# Patient Record
Sex: Female | Born: 1990 | Race: Black or African American | Hispanic: No | Marital: Single | Smoking: Current some day smoker
Health system: Southern US, Community
[De-identification: ages and names within clinical notes are randomized; demographics above are authoritative.]

## PROBLEM LIST (undated history)

## (undated) ENCOUNTER — Inpatient Hospital Stay (HOSPITAL_COMMUNITY): Payer: Self-pay

## (undated) DIAGNOSIS — F329 Major depressive disorder, single episode, unspecified: Secondary | ICD-10-CM

## (undated) DIAGNOSIS — O139 Gestational [pregnancy-induced] hypertension without significant proteinuria, unspecified trimester: Secondary | ICD-10-CM

---
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
## 2009-07-03 ENCOUNTER — Ambulatory Visit (HOSPITAL_COMMUNITY): Admission: RE | Admit: 2009-07-03 | Discharge: 2009-07-03 | Payer: Self-pay | Admitting: Obstetrics

## 2009-09-08 DIAGNOSIS — O139 Gestational [pregnancy-induced] hypertension without significant proteinuria, unspecified trimester: Secondary | ICD-10-CM

## 2009-09-08 HISTORY — DX: Gestational (pregnancy-induced) hypertension without significant proteinuria, unspecified trimester: O13.9

## 2009-11-26 ENCOUNTER — Inpatient Hospital Stay (HOSPITAL_COMMUNITY): Admission: AD | Admit: 2009-11-26 | Discharge: 2009-11-29 | Payer: Self-pay | Admitting: Obstetrics

## 2010-01-16 ENCOUNTER — Encounter: Admission: RE | Admit: 2010-01-16 | Discharge: 2010-01-16 | Payer: Self-pay | Admitting: Obstetrics

## 2010-12-02 LAB — COMPREHENSIVE METABOLIC PANEL
ALT: 17 U/L (ref 0–35)
Albumin: 2.2 g/dL — ABNORMAL LOW (ref 3.5–5.2)
Alkaline Phosphatase: 125 U/L — ABNORMAL HIGH (ref 39–117)
BUN: 6 mg/dL (ref 6–23)
CO2: 20 mEq/L (ref 19–32)
CO2: 25 mEq/L (ref 19–32)
Calcium: 7.4 mg/dL — ABNORMAL LOW (ref 8.4–10.5)
Calcium: 8.9 mg/dL (ref 8.4–10.5)
Chloride: 106 mEq/L (ref 96–112)
GFR calc Af Amer: >60 mL/min (ref >60)
GFR calc non Af Amer: >60 mL/min (ref >60)
GFR calc non Af Amer: >60 mL/min (ref >60)
Glucose, Bld: 104 mg/dL — ABNORMAL HIGH (ref 70–99)
Sodium: 136 mEq/L (ref 135–145)
Sodium: 136 mEq/L (ref 135–145)
Total Bilirubin: 0.1 mg/dL — ABNORMAL LOW (ref 0.3–1.2)
Total Bilirubin: 0.1 mg/dL — ABNORMAL LOW (ref 0.3–1.2)
Total Protein: 5.5 g/dL — ABNORMAL LOW (ref 6.0–8.3)
Total Protein: 5.9 g/dL — ABNORMAL LOW (ref 6.0–8.3)

## 2010-12-02 LAB — CBC
HCT: 29.6 % — ABNORMAL LOW (ref 36.0–46.0)
Hemoglobin: 9.6 g/dL — ABNORMAL LOW (ref 12.0–15.0)
Hemoglobin: 9.8 g/dL — ABNORMAL LOW (ref 12.0–15.0)
Platelets: 245 10*3/uL (ref 150–400)
RBC: 3.14 MIL/uL — ABNORMAL LOW (ref 3.87–5.11)
RBC: 3.49 MIL/uL — ABNORMAL LOW (ref 3.87–5.11)
RBC: 3.52 MIL/uL — ABNORMAL LOW (ref 3.87–5.11)
RDW: 15.3 % (ref 11.5–15.5)
WBC: 7.7 10*3/uL (ref 4.0–10.5)
WBC: 9 10*3/uL (ref 4.0–10.5)

## 2010-12-02 LAB — TYPE AND SCREEN: ABO/RH(D): A POS

## 2010-12-02 LAB — LACTATE DEHYDROGENASE: LDH: 220 U/L (ref 94–250)

## 2011-02-28 ENCOUNTER — Inpatient Hospital Stay (INDEPENDENT_AMBULATORY_CARE_PROVIDER_SITE_OTHER)
Admission: RE | Admit: 2011-02-28 | Discharge: 2011-02-28 | Disposition: A | Discharge status: Home / Self Care | Payer: Medicaid Other | Source: Ambulatory Visit | Attending: Emergency Medicine | Admitting: Emergency Medicine

## 2011-02-28 DIAGNOSIS — Z331 Pregnant state, incidental: Secondary | ICD-10-CM

## 2011-02-28 LAB — POCT URINALYSIS DIP (DEVICE)
Bilirubin Urine: NEGATIVE
Glucose, UA: NEGATIVE mg/dL
Hgb urine dipstick: NEGATIVE
Ketones, ur: NEGATIVE mg/dL
Leukocytes, UA: NEGATIVE
Nitrite: POSITIVE — AB
Protein, ur: NEGATIVE mg/dL
Specific Gravity, Urine: 1.02 (ref 1.005–1.030)
Urobilinogen, UA: 1 mg/dL (ref 0.0–1.0)

## 2011-02-28 LAB — POCT PREGNANCY, URINE: Preg Test, Ur: POSITIVE

## 2011-02-28 LAB — WET PREP, GENITAL

## 2011-05-14 ENCOUNTER — Other Ambulatory Visit (HOSPITAL_COMMUNITY): Payer: Self-pay | Admitting: Obstetrics

## 2011-05-14 DIAGNOSIS — Z3689 Encounter for other specified antenatal screening: Secondary | ICD-10-CM

## 2011-05-16 ENCOUNTER — Ambulatory Visit (HOSPITAL_COMMUNITY)
Admission: RE | Admit: 2011-05-16 | Discharge: 2011-05-16 | Disposition: A | Discharge status: Home / Self Care | Payer: Medicaid Other | Source: Ambulatory Visit | Attending: Obstetrics | Admitting: Obstetrics

## 2011-05-16 DIAGNOSIS — Z3689 Encounter for other specified antenatal screening: Secondary | ICD-10-CM

## 2011-05-16 DIAGNOSIS — Z1389 Encounter for screening for other disorder: Secondary | ICD-10-CM | POA: Insufficient documentation

## 2011-05-16 DIAGNOSIS — Z363 Encounter for antenatal screening for malformations: Secondary | ICD-10-CM | POA: Insufficient documentation

## 2011-05-16 DIAGNOSIS — O358XX0 Maternal care for other (suspected) fetal abnormality and damage, not applicable or unspecified: Secondary | ICD-10-CM | POA: Insufficient documentation

## 2011-05-22 ENCOUNTER — Other Ambulatory Visit (HOSPITAL_COMMUNITY): Payer: Self-pay | Admitting: Obstetrics

## 2011-05-22 DIAGNOSIS — N1330 Unspecified hydronephrosis: Secondary | ICD-10-CM

## 2011-07-04 ENCOUNTER — Ambulatory Visit (HOSPITAL_COMMUNITY)
Admission: RE | Admit: 2011-07-04 | Discharge: 2011-07-04 | Disposition: A | Discharge status: Home / Self Care | Payer: Medicaid Other | Source: Ambulatory Visit | Attending: Obstetrics | Admitting: Obstetrics

## 2011-07-04 DIAGNOSIS — N1330 Unspecified hydronephrosis: Secondary | ICD-10-CM

## 2011-07-04 DIAGNOSIS — O09299 Supervision of pregnancy with other poor reproductive or obstetric history, unspecified trimester: Secondary | ICD-10-CM | POA: Insufficient documentation

## 2011-07-04 DIAGNOSIS — O358XX0 Maternal care for other (suspected) fetal abnormality and damage, not applicable or unspecified: Secondary | ICD-10-CM | POA: Insufficient documentation

## 2011-09-22 ENCOUNTER — Other Ambulatory Visit (HOSPITAL_COMMUNITY): Payer: Self-pay | Admitting: Obstetrics

## 2011-09-22 DIAGNOSIS — N1330 Unspecified hydronephrosis: Secondary | ICD-10-CM

## 2011-09-25 ENCOUNTER — Ambulatory Visit (HOSPITAL_COMMUNITY)
Admission: RE | Admit: 2011-09-25 | Discharge: 2011-09-25 | Disposition: A | Discharge status: Home / Self Care | Payer: Medicaid Other | Source: Ambulatory Visit | Attending: Obstetrics | Admitting: Obstetrics

## 2011-09-25 DIAGNOSIS — O358XX0 Maternal care for other (suspected) fetal abnormality and damage, not applicable or unspecified: Secondary | ICD-10-CM | POA: Insufficient documentation

## 2011-09-25 DIAGNOSIS — N1330 Unspecified hydronephrosis: Secondary | ICD-10-CM

## 2011-10-11 ENCOUNTER — Encounter (HOSPITAL_COMMUNITY): Payer: Self-pay

## 2011-10-11 ENCOUNTER — Inpatient Hospital Stay (HOSPITAL_COMMUNITY)
Admission: AD | Admit: 2011-10-11 | Discharge: 2011-10-11 | Disposition: A | Discharge status: Home / Self Care | Payer: Medicaid Other | Source: Ambulatory Visit | Attending: Obstetrics | Admitting: Obstetrics

## 2011-10-11 DIAGNOSIS — O99891 Other specified diseases and conditions complicating pregnancy: Secondary | ICD-10-CM | POA: Insufficient documentation

## 2011-10-11 DIAGNOSIS — O479 False labor, unspecified: Secondary | ICD-10-CM

## 2011-10-11 LAB — URINALYSIS, DIPSTICK ONLY
Bilirubin Urine: NEGATIVE
Hgb urine dipstick: NEGATIVE
Ketones, ur: NEGATIVE mg/dL
Nitrite: NEGATIVE
Protein, ur: NEGATIVE mg/dL
Specific Gravity, Urine: 1.01 (ref 1.005–1.030)
Urobilinogen, UA: 0.2 mg/dL (ref 0.0–1.0)

## 2011-10-11 LAB — URIC ACID: Uric Acid, Serum: 4.3 mg/dL (ref 2.4–7.0)

## 2011-10-11 LAB — COMPREHENSIVE METABOLIC PANEL
ALT: 8 U/L (ref 0–35)
AST: 14 U/L (ref 0–37)
Calcium: 8.5 mg/dL (ref 8.4–10.5)
Creatinine, Ser: 0.43 mg/dL — ABNORMAL LOW (ref 0.50–1.10)
GFR calc non Af Amer: >90 mL/min (ref >90)
Sodium: 134 mEq/L — ABNORMAL LOW (ref 135–145)
Total Protein: 6.2 g/dL (ref 6.0–8.3)

## 2011-10-11 LAB — CBC
MCH: 25.5 pg — ABNORMAL LOW (ref 26.0–34.0)
MCHC: 32.4 g/dL (ref 30.0–36.0)
MCV: 78.8 fL (ref 78.0–100.0)
Platelets: 269 10*3/uL (ref 150–400)
RDW: 14.2 % (ref 11.5–15.5)

## 2011-10-11 NOTE — Progress Notes (Signed)
Has been contracting on & off all week, but frequency increased to about q72mins yesterday @ 1400. Rates UCs as 7-8/10 on pain scale.   C/O small amt of grayish fluid present starting this AM @ 0600.

## 2011-10-11 NOTE — ED Provider Notes (Signed)
Chief Complaint:   ?SROM  Nicole [name] is  21 y.o.  G2P1001 at 39 6/7 wks presents complaining of rupture of membranes.  She states irregular UCs every 7 minutes  associated with no vaginal bleeding.  ? leaking membranes with wetness noted at 066, after that feeling "like peeing on myself" but no large gush;  active fetal movement. Cx 1.5 in office last week.    Obstetrical/Gynecological History: Menstrual History: OB History    No data available       No LMP recorded.     Past Medical History: No past medical history on file.  Past Surgical History: No past surgical history on file.  Family History: No family history on file.  Social History: History  Substance Use Topics  . Smoking status: Not on file  . Smokeless tobacco: Not on file  . Alcohol Use: Not on file    Allergies: Allergies not on file  Meds:  No prescriptions prior to admission    Review of Systems: Negative except Mild intermittent H/A past week, not at present  Physical Exam   Filed Vitals:   10/11/11 1225  BP: 141/82  Pulse: 93  Temp: 98.1 F (36.7 C)  Resp: 18   GENERAL: Well-developed, well-nourished female in no acute distress.  ABDOMEN: Soft, nontender, nondistended, gravid.  EXTREMITIES: Nontender, no edema, 2+ distal pulses. SPEC: physiologic white discharge, fern neg. Amnisure obtained Cervical Exam: Dilatation 1.5cm   Effacement 30%   Station -3        Presentation: cephalic FHT:  Baseline rate 150 bpm   Variability moderate  Accelerations present   Decelerations none Contractions: irreg Labs: Fern neg    Results for orders placed during the hospital encounter of 10/11/11 (from the past 24 hour(s))  AMNISURE RUPTURE OF MEMBRANE (ROM)     Status: Normal   Collection Time   10/11/11 12:13 PM      Component Value Range   Amnisure ROM NEGATIVE      Assessment/Plan:  G2P1001 at [redacted]w[redacted]d without evidence for ROM, not in labor. Cat 1 FHR Borderline BP elevation -> will  check serial BPs and UA dipstick, then C/W Dr. Gaynell Face.  Results for orders placed during the hospital encounter of 10/11/11 (from the past 24 hour(s))  AMNISURE RUPTURE OF MEMBRANE (ROM)     Status: Normal   Collection Time   10/11/11 12:13 PM      Component Value Range   Amnisure ROM NEGATIVE    URINALYSIS, DIPSTICK ONLY     Status: Normal   Collection Time   10/11/11  1:00 PM      Component Value Range   Specific Gravity, Urine 1.010  1.005 - 1.030    pH 7.5  5.0 - 8.0    Glucose, UA NEGATIVE  NEGATIVE (mg/dL)   Hgb urine dipstick NEGATIVE  NEGATIVE    Bilirubin Urine NEGATIVE  NEGATIVE    Ketones, ur NEGATIVE  NEGATIVE (mg/dL)   Protein, ur NEGATIVE  NEGATIVE (mg/dL)   Urobilinogen, UA 0.2  0.0 - 1.0 (mg/dL)   Nitrite NEGATIVE  NEGATIVE    Leukocytes, UA NEGATIVE  NEGATIVE   CBC     Status: Abnormal   Collection Time   10/11/11  2:03 PM      Component Value Range   WBC 6.5  4.0 - 10.5 (K/uL)   RBC 3.64 (*) 3.87 - 5.11 (MIL/uL)   Hemoglobin 9.3 (*) 12.0 - 15.0 (g/dL)   HCT 04.5 (*) 40.9 - 46.0 (%)  MCV 78.8  78.0 - 100.0 (fL)   MCH 25.5 (*) 26.0 - 34.0 (pg)   MCHC 32.4  30.0 - 36.0 (g/dL)   RDW 78.2  95.6 - 21.3 (%)   Platelets 269  150 - 400 (K/uL)  COMPREHENSIVE METABOLIC PANEL     Status: Abnormal   Collection Time   10/11/11  2:03 PM      Component Value Range   Sodium 134 (*) 135 - 145 (mEq/L)   Potassium 3.5  3.5 - 5.1 (mEq/L)   Chloride 103  96 - 112 (mEq/L)   CO2 22  19 - 32 (mEq/L)   Glucose, Bld 85  70 - 99 (mg/dL)   BUN 7  6 - 23 (mg/dL)   Creatinine, Ser 0.86 (*) 0.50 - 1.10 (mg/dL)   Calcium 8.5  8.4 - 57.8 (mg/dL)   Total Protein 6.2  6.0 - 8.3 (g/dL)   Albumin 2.6 (*) 3.5 - 5.2 (g/dL)   AST 14  0 - 37 (U/L)   ALT 8  0 - 35 (U/L)   Alkaline Phosphatase 146 (*) 39 - 117 (U/L)   Total Bilirubin 0.2 (*) 0.3 - 1.2 (mg/dL)   GFR calc non Af Amer >90  >90 (mL/min)   GFR calc Af Amer >90  >90 (mL/min)  LACTATE DEHYDROGENASE     Status: Normal   Collection  Time   10/11/11  2:03 PM      Component Value Range   LD 162  94 - 250 (U/L)  URIC ACID     Status: Normal   Collection Time   10/11/11  2:03 PM      Component Value Range   Uric Acid, Serum 4.3  2.4 - 7.0 (mg/dL)   RN spoke with Dr. Gaynell Face - Plan discharge home  Nicole [name] 2/2/201312:14 PM

## 2011-10-11 NOTE — Progress Notes (Signed)
Called D. Poe, CNM.  Reviewed Labs, EFM & BPs.  She will enter D/C orders.

## 2011-10-14 ENCOUNTER — Inpatient Hospital Stay (HOSPITAL_COMMUNITY): Payer: Medicaid Other

## 2011-10-14 ENCOUNTER — Inpatient Hospital Stay (HOSPITAL_COMMUNITY): Payer: Medicaid Other | Admitting: Anesthesiology

## 2011-10-14 ENCOUNTER — Encounter (HOSPITAL_COMMUNITY): Payer: Self-pay

## 2011-10-14 ENCOUNTER — Encounter (HOSPITAL_COMMUNITY): Payer: Self-pay | Admitting: Anesthesiology

## 2011-10-14 ENCOUNTER — Inpatient Hospital Stay (HOSPITAL_COMMUNITY)
Admission: RE | Admit: 2011-10-14 | Discharge: 2011-10-18 | DRG: 766 | Disposition: A | Discharge status: Home / Self Care | Payer: Medicaid Other | Source: Ambulatory Visit | Attending: Obstetrics | Admitting: Obstetrics

## 2011-10-14 ENCOUNTER — Encounter (HOSPITAL_COMMUNITY)
Admission: RE | Disposition: A | Discharge status: Home / Self Care | Payer: Self-pay | Source: Ambulatory Visit | Attending: Obstetrics

## 2011-10-14 DIAGNOSIS — O3660X0 Maternal care for excessive fetal growth, unspecified trimester, not applicable or unspecified: Principal | ICD-10-CM | POA: Diagnosis present

## 2011-10-14 LAB — CBC
HCT: 29.8 % — ABNORMAL LOW (ref 36.0–46.0)
MCV: 79.3 fL (ref 78.0–100.0)
RBC: 3.76 MIL/uL — ABNORMAL LOW (ref 3.87–5.11)
WBC: 6.7 10*3/uL (ref 4.0–10.5)

## 2011-10-14 LAB — COMPREHENSIVE METABOLIC PANEL
ALT: 9 U/L (ref 0–35)
Albumin: 2.7 g/dL — ABNORMAL LOW (ref 3.5–5.2)
Alkaline Phosphatase: 162 U/L — ABNORMAL HIGH (ref 39–117)
BUN: 8 mg/dL (ref 6–23)
Chloride: 104 mEq/L (ref 96–112)
GFR calc Af Amer: >90 mL/min (ref >90)
Glucose, Bld: 67 mg/dL — ABNORMAL LOW (ref 70–99)
Potassium: 3.6 mEq/L (ref 3.5–5.1)
Sodium: 135 mEq/L (ref 135–145)
Total Bilirubin: 0.2 mg/dL — ABNORMAL LOW (ref 0.3–1.2)
Total Protein: 6.5 g/dL (ref 6.0–8.3)

## 2011-10-14 LAB — RPR: RPR: NONREACTIVE

## 2011-10-14 LAB — LACTATE DEHYDROGENASE: LDH: 201 U/L (ref 94–250)

## 2011-10-14 LAB — HIV ANTIBODY (ROUTINE TESTING W REFLEX): HIV: NONREACTIVE

## 2011-10-14 LAB — GC/CHLAMYDIA PROBE AMP, GENITAL: Gonorrhea: NEGATIVE

## 2011-10-14 LAB — ABO/RH: RH Type: POSITIVE

## 2011-10-14 LAB — STREP B DNA PROBE: GBS: NEGATIVE

## 2011-10-14 SURGERY — Surgical Case
Anesthesia: Epidural | Site: Abdomen | Wound class: Clean Contaminated

## 2011-10-14 MED ORDER — PHENYLEPHRINE 40 MCG/ML (10ML) SYRINGE FOR IV PUSH (FOR BLOOD PRESSURE SUPPORT): 80.0000 ug | PREFILLED_SYRINGE | INTRAVENOUS | Status: DC | PRN

## 2011-10-14 MED ORDER — OXYTOCIN 10 UNIT/ML IJ SOLN
INTRAMUSCULAR | Status: AC
  Filled 2011-10-14: qty 2

## 2011-10-14 MED ORDER — FENTANYL 2.5 MCG/ML BUPIVACAINE 1/10 % EPIDURAL INFUSION (WH - ANES): 14.0000 mL/h | INTRAMUSCULAR | Status: DC

## 2011-10-14 MED ORDER — PHENYLEPHRINE 40 MCG/ML (10ML) SYRINGE FOR IV PUSH (FOR BLOOD PRESSURE SUPPORT)
80.0000 ug | PREFILLED_SYRINGE | INTRAVENOUS | Status: DC | PRN
  Filled 2011-10-14: qty 5

## 2011-10-14 MED ORDER — EPHEDRINE 5 MG/ML INJ: 10.0000 mg | INTRAVENOUS | Status: DC | PRN

## 2011-10-14 MED ORDER — MORPHINE SULFATE 0.5 MG/ML IJ SOLN
INTRAMUSCULAR | Status: AC
  Filled 2011-10-14: qty 10

## 2011-10-14 MED ORDER — CEFAZOLIN SODIUM 1-5 GM-% IV SOLN: INTRAVENOUS | Status: DC | PRN

## 2011-10-14 MED ORDER — ONDANSETRON HCL 4 MG/2ML IJ SOLN: 4.0000 mg | Freq: Four times a day (QID) | INTRAMUSCULAR | Status: DC | PRN

## 2011-10-14 MED ORDER — OXYCODONE-ACETAMINOPHEN 5-325 MG PO TABS: 1.0000 | ORAL_TABLET | ORAL | Status: DC | PRN

## 2011-10-14 MED ORDER — TERBUTALINE SULFATE 1 MG/ML IJ SOLN: 0.2500 mg | Freq: Once | INTRAMUSCULAR | Status: DC | PRN

## 2011-10-14 MED ORDER — LACTATED RINGERS IV SOLN: 500.0000 mL | INTRAVENOUS | Status: DC | PRN

## 2011-10-14 MED ORDER — OXYTOCIN 20 UNITS IN LACTATED RINGERS INFUSION - SIMPLE
1.0000 m[IU]/min | INTRAVENOUS | Status: DC
  Administered 2011-10-14: 1 m[IU]/min via INTRAVENOUS
  Filled 2011-10-14: qty 1000

## 2011-10-14 MED ORDER — SODIUM CHLORIDE 0.9 % IJ SOLN: 3.0000 mL | INTRAMUSCULAR | Status: DC | PRN

## 2011-10-14 MED ORDER — CEFAZOLIN SODIUM 1-5 GM-% IV SOLN
INTRAVENOUS | Status: AC
  Filled 2011-10-14: qty 50

## 2011-10-14 MED ORDER — FENTANYL 2.5 MCG/ML BUPIVACAINE 1/10 % EPIDURAL INFUSION (WH - ANES)
14.0000 mL/h | INTRAMUSCULAR | Status: DC
  Filled 2011-10-14: qty 60

## 2011-10-14 MED ORDER — FENTANYL 2.5 MCG/ML BUPIVACAINE 1/10 % EPIDURAL INFUSION (WH - ANES)
INTRAMUSCULAR | Status: DC | PRN
  Administered 2011-10-14: 14 mL/h via EPIDURAL

## 2011-10-14 MED ORDER — FLEET ENEMA 7-19 GM/118ML RE ENEM: 1.0000 | ENEMA | RECTAL | Status: DC | PRN

## 2011-10-14 MED ORDER — DIPHENHYDRAMINE HCL 50 MG/ML IJ SOLN: 12.5000 mg | INTRAMUSCULAR | Status: DC | PRN

## 2011-10-14 MED ORDER — EPHEDRINE 5 MG/ML INJ
10.0000 mg | INTRAVENOUS | Status: DC | PRN
  Filled 2011-10-14: qty 4

## 2011-10-14 MED ORDER — ONDANSETRON HCL 4 MG/2ML IJ SOLN
INTRAMUSCULAR | Status: AC
  Filled 2011-10-14: qty 2

## 2011-10-14 MED ORDER — LACTATED RINGERS IV SOLN
500.0000 mL | Freq: Once | INTRAVENOUS | Status: AC
  Administered 2011-10-14: 500 mL via INTRAVENOUS

## 2011-10-14 MED ORDER — LIDOCAINE-EPINEPHRINE (PF) 2 %-1:200000 IJ SOLN
INTRAMUSCULAR | Status: AC
  Filled 2011-10-14: qty 20

## 2011-10-14 MED ORDER — LACTATED RINGERS IV SOLN
INTRAVENOUS | Status: DC
  Administered 2011-10-14 – 2011-10-15 (×4): via INTRAVENOUS

## 2011-10-14 MED ORDER — EPHEDRINE 5 MG/ML INJ
INTRAVENOUS | Status: AC
  Filled 2011-10-14: qty 10

## 2011-10-14 MED ORDER — BUTORPHANOL TARTRATE 2 MG/ML IJ SOLN
1.0000 mg | INTRAMUSCULAR | Status: DC | PRN
  Administered 2011-10-14: 1 mg via INTRAVENOUS
  Filled 2011-10-14: qty 1

## 2011-10-14 MED ORDER — CITRIC ACID-SODIUM CITRATE 334-500 MG/5ML PO SOLN
30.0000 mL | ORAL | Status: DC | PRN
  Administered 2011-10-14: 30 mL via ORAL
  Filled 2011-10-14: qty 15

## 2011-10-14 MED ORDER — EPHEDRINE SULFATE 50 MG/ML IJ SOLN
INTRAMUSCULAR | Status: DC | PRN
  Administered 2011-10-14 – 2011-10-15 (×2): 10 mg via INTRAVENOUS

## 2011-10-14 MED ORDER — LIDOCAINE HCL (PF) 1 % IJ SOLN: 30.0000 mL | INTRAMUSCULAR | Status: DC | PRN

## 2011-10-14 MED ORDER — CEFAZOLIN SODIUM 1-5 GM-% IV SOLN
1.0000 g | Freq: Three times a day (TID) | INTRAVENOUS | Status: DC
  Administered 2011-10-14: 1 g via INTRAVENOUS
  Filled 2011-10-14 (×3): qty 50

## 2011-10-14 MED ORDER — IBUPROFEN 600 MG PO TABS: 600.0000 mg | ORAL_TABLET | Freq: Four times a day (QID) | ORAL | Status: DC | PRN

## 2011-10-14 MED ORDER — ACETAMINOPHEN 325 MG PO TABS: 650.0000 mg | ORAL_TABLET | ORAL | Status: DC | PRN

## 2011-10-14 MED ORDER — PHENYLEPHRINE 40 MCG/ML (10ML) SYRINGE FOR IV PUSH (FOR BLOOD PRESSURE SUPPORT)
PREFILLED_SYRINGE | INTRAVENOUS | Status: AC
  Filled 2011-10-14: qty 5

## 2011-10-14 MED ORDER — LACTATED RINGERS IV SOLN: 500.0000 mL | Freq: Once | INTRAVENOUS | Status: DC

## 2011-10-14 MED ORDER — OXYTOCIN BOLUS FROM INFUSION
500.0000 mL | Freq: Once | INTRAVENOUS | Status: DC
  Filled 2011-10-14: qty 500

## 2011-10-14 MED ORDER — SODIUM BICARBONATE 8.4 % IV SOLN
INTRAVENOUS | Status: DC | PRN
  Administered 2011-10-14: 4 mL via EPIDURAL

## 2011-10-14 MED ORDER — OXYTOCIN 20 UNITS IN LACTATED RINGERS INFUSION - SIMPLE: 125.0000 mL/h | Freq: Once | INTRAVENOUS | Status: DC

## 2011-10-14 SURGICAL SUPPLY — 32 items
CHLORAPREP W/TINT 26ML (MISCELLANEOUS) ×2 IMPLANT
CLOTH BEACON ORANGE TIMEOUT ST (SAFETY) ×2 IMPLANT
DERMABOND ADVANCED (GAUZE/BANDAGES/DRESSINGS) ×1
DERMABOND ADVANCED .7 DNX12 (GAUZE/BANDAGES/DRESSINGS) ×1 IMPLANT
ELECT REM PT RETURN 9FT ADLT (ELECTROSURGICAL) ×2
ELECTRODE REM PT RTRN 9FT ADLT (ELECTROSURGICAL) ×1 IMPLANT
EXTRACTOR VACUUM M CUP 4 TUBE (SUCTIONS) IMPLANT
GLOVE BIO SURGEON STRL SZ8.5 (GLOVE) ×4 IMPLANT
GLOVE SKINSENSE NS SZ7.5 (GLOVE) ×1
GLOVE SKINSENSE NS SZ8.0 LF (GLOVE) ×1
GLOVE SKINSENSE STRL SZ7.5 (GLOVE) ×1 IMPLANT
GLOVE SKINSENSE STRL SZ8.0 LF (GLOVE) ×1 IMPLANT
GOWN PREVENTION PLUS LG XLONG (DISPOSABLE) ×4 IMPLANT
GOWN PREVENTION PLUS XXLARGE (GOWN DISPOSABLE) ×2 IMPLANT
KIT ABG SYR 3ML LUER SLIP (SYRINGE) IMPLANT
NEEDLE HYPO 25X5/8 SAFETYGLIDE (NEEDLE) IMPLANT
NS IRRIG 1000ML POUR BTL (IV SOLUTION) ×2 IMPLANT
PACK C SECTION WH (CUSTOM PROCEDURE TRAY) ×2 IMPLANT
SLEEVE SCD COMPRESS KNEE MED (MISCELLANEOUS) ×2 IMPLANT
SPONGE LAP 18X18 X RAY DECT (DISPOSABLE) ×2 IMPLANT
SUT CHROMIC 0 CT 802H (SUTURE) ×2 IMPLANT
SUT CHROMIC 1 CTX 36 (SUTURE) ×4 IMPLANT
SUT CHROMIC 2 0 SH (SUTURE) ×2 IMPLANT
SUT GUT PLAIN 0 CT-3 TAN 27 (SUTURE) IMPLANT
SUT MON AB 4-0 PS1 27 (SUTURE) ×2 IMPLANT
SUT VIC AB 0 CT1 18XCR BRD8 (SUTURE) IMPLANT
SUT VIC AB 0 CT1 8-18 (SUTURE)
SUT VIC AB 0 CTX 36 (SUTURE) ×2
SUT VIC AB 0 CTX36XBRD ANBCTRL (SUTURE) ×2 IMPLANT
TOWEL OR 17X24 6PK STRL BLUE (TOWEL DISPOSABLE) ×4 IMPLANT
TRAY FOLEY CATH 14FR (SET/KITS/TRAYS/PACK) IMPLANT
WATER STERILE IRR 1000ML POUR (IV SOLUTION) IMPLANT

## 2011-10-14 NOTE — Anesthesia Preprocedure Evaluation (Signed)
Anesthesia Evaluation  Patient identified by MRN, date of birth, ID band Patient awake    Reviewed: Allergy & Precautions, H&P , Patient's Chart, lab work & pertinent test results  Airway Mallampati: II TM Distance: >3 FB Neck ROM: full    Dental  (+) Teeth Intact   Pulmonary  clear to auscultation        Cardiovascular regular Normal    Neuro/Psych    GI/Hepatic   Endo/Other  Morbid obesity  Renal/GU      Musculoskeletal   Abdominal   Peds  Hematology   Anesthesia Other Findings       Reproductive/Obstetrics (+) Pregnancy                           Anesthesia Physical Anesthesia Plan  ASA: III  Anesthesia Plan: Epidural   Post-op Pain Management:    Induction:   Airway Management Planned:   Additional Equipment:   Intra-op Plan:   Post-operative Plan:   Informed Consent: I have reviewed the patients History and Physical, chart, labs and discussed the procedure including the risks, benefits and alternatives for the proposed anesthesia with the patient or authorized representative who has indicated his/her understanding and acceptance.   Dental Advisory Given  Plan Discussed with:   Anesthesia Plan Comments: (Labs checked- platelets confirmed with RN in room. Fetal heart tracing, per RN, reported to be stable enough for sitting procedure. Discussed epidural, and patient consents to the procedure:  included risk of possible headache,backache, failed block, allergic reaction, and nerve injury. This patient was asked if she had any questions or concerns before the procedure started. )        Anesthesia Quick Evaluation  

## 2011-10-14 NOTE — H&P (Signed)
This is Dr. Francoise Ceo dictating the history and physical on  Nicole [name] she's a 21 year old gravida 2 para 101 at 40 weeks and 1 day EDC to 413 she was brought in for induction because of a microsomia on 117 she had an ultrasound which given estimated fetal weight of 8 lbs. 11 oz. And today ultrasound users and a 9 lbs. 3 oz. This was discussed with the patient and she wanted to labor she understands the risks of general possible Erb's palsy and possible worse what she wants a trial of labor her GBS is negative her cervix is 3 cm 80% with the vertex at a -3 station amniotomy was performed no fluid noted ultrasound also showed a decrease in amniotic fluid Past medical history negative Past surgical history negative Social history negative System review negative HEENT negative Breasts negative Heart regular rhythm no murmurs no gallops Lungs clear Abdomen term pelvic as described above extremities negative and

## 2011-10-14 NOTE — Progress Notes (Signed)
Patient ID: Nicole [name], female   DOB: 28-Nov-1990, 21 y.o.   MRN: [id number] Cervix 3-4 cm 90% vertex -3 station patient has been in good labor all-day progress a him a history of macrosomic baby she wants to be delivered by C-section and her wishes granted indication failure to progress in labor

## 2011-10-14 NOTE — Consult Note (Signed)
Called to attend primary C/S for macrosomia w/o history of diabetes, gestational or otherwise. AROM today @ 1645 (clear).    At delivery infant in vertex and was manually extracted with good tone and a spontaneous cry. Placed under radiant warmer and provided vigorous tactile stimulation and bulb suction to naso/oropharynx. No dysmorphic features noted. Coarse respirations but lungs clear - observed post 5 minutes for intermittent grunty coarse respirations.   Apgar 8/9 at one and five minutes of age.    Care transferred to [facility] RN and to assigned pediatrician.   Dagoberto Ligas MD [facility]

## 2011-10-14 NOTE — Anesthesia Procedure Notes (Signed)

## 2011-10-15 ENCOUNTER — Encounter (HOSPITAL_COMMUNITY): Payer: Self-pay | Admitting: Obstetrics

## 2011-10-15 LAB — CBC
HCT: 23.8 % — ABNORMAL LOW (ref 36.0–46.0)
Hemoglobin: 7.8 g/dL — ABNORMAL LOW (ref 12.0–15.0)
MCH: 26.1 pg (ref 26.0–34.0)
MCHC: 32.8 g/dL (ref 30.0–36.0)
MCV: 79.6 fL (ref 78.0–100.0)
Platelets: 226 K/uL (ref 150–400)
RBC: 2.99 MIL/uL — ABNORMAL LOW (ref 3.87–5.11)
RDW: 14.7 % (ref 11.5–15.5)
WBC: 9 K/uL (ref 4.0–10.5)

## 2011-10-15 MED ORDER — MEPERIDINE HCL 25 MG/ML IJ SOLN: 6.2500 mg | INTRAMUSCULAR | Status: DC | PRN

## 2011-10-15 MED ORDER — ONDANSETRON HCL 4 MG/2ML IJ SOLN: 4.0000 mg | INTRAMUSCULAR | Status: DC | PRN

## 2011-10-15 MED ORDER — CHLOROPROCAINE HCL 3 % IJ SOLN
INTRAMUSCULAR | Status: AC
  Filled 2011-10-15: qty 20

## 2011-10-15 MED ORDER — LACTATED RINGERS IV SOLN
INTRAVENOUS | Status: DC
  Administered 2011-10-15: 07:00:00 via INTRAVENOUS

## 2011-10-15 MED ORDER — OXYTOCIN 20 UNITS IN LACTATED RINGERS INFUSION - SIMPLE
INTRAVENOUS | Status: DC | PRN
  Administered 2011-10-15 (×2): 20 [IU] via INTRAVENOUS

## 2011-10-15 MED ORDER — SIMETHICONE 80 MG PO CHEW
80.0000 mg | CHEWABLE_TABLET | Freq: Three times a day (TID) | ORAL | Status: DC
  Administered 2011-10-15 – 2011-10-18 (×11): 80 mg via ORAL

## 2011-10-15 MED ORDER — IBUPROFEN 600 MG PO TABS: 600.0000 mg | ORAL_TABLET | Freq: Four times a day (QID) | ORAL | Status: DC | PRN

## 2011-10-15 MED ORDER — MENTHOL 3 MG MT LOZG: 1.0000 | LOZENGE | OROMUCOSAL | Status: DC | PRN

## 2011-10-15 MED ORDER — KETOROLAC TROMETHAMINE 30 MG/ML IJ SOLN: 30.0000 mg | Freq: Four times a day (QID) | INTRAMUSCULAR | Status: AC | PRN

## 2011-10-15 MED ORDER — PRENATAL MULTIVITAMIN CH
1.0000 | ORAL_TABLET | Freq: Every day | ORAL | Status: DC
  Administered 2011-10-16 – 2011-10-18 (×3): 1 via ORAL
  Filled 2011-10-15 (×3): qty 1

## 2011-10-15 MED ORDER — 0.9 % SODIUM CHLORIDE (POUR BTL) OPTIME
TOPICAL | Status: DC | PRN
  Administered 2011-10-15: 1000 mL

## 2011-10-15 MED ORDER — DIBUCAINE 1 % RE OINT: 1.0000 "application " | TOPICAL_OINTMENT | RECTAL | Status: DC | PRN

## 2011-10-15 MED ORDER — IBUPROFEN 600 MG PO TABS
600.0000 mg | ORAL_TABLET | Freq: Four times a day (QID) | ORAL | Status: DC
  Administered 2011-10-15 – 2011-10-18 (×13): 600 mg via ORAL
  Filled 2011-10-15 (×13): qty 1

## 2011-10-15 MED ORDER — CHLOROPROCAINE HCL 3 % IJ SOLN
INTRAMUSCULAR | Status: DC | PRN
  Administered 2011-10-15: 20 mL

## 2011-10-15 MED ORDER — HYDROMORPHONE HCL PF 1 MG/ML IJ SOLN
INTRAMUSCULAR | Status: AC
  Filled 2011-10-15: qty 1

## 2011-10-15 MED ORDER — ONDANSETRON HCL 4 MG/2ML IJ SOLN: 4.0000 mg | Freq: Three times a day (TID) | INTRAMUSCULAR | Status: DC | PRN

## 2011-10-15 MED ORDER — SODIUM CHLORIDE 0.9 % IV SOLN
1.0000 ug/kg/h | INTRAVENOUS | Status: DC | PRN
  Filled 2011-10-15: qty 2.5

## 2011-10-15 MED ORDER — KETOROLAC TROMETHAMINE 60 MG/2ML IM SOLN
INTRAMUSCULAR | Status: AC
  Filled 2011-10-15: qty 2

## 2011-10-15 MED ORDER — DIPHENHYDRAMINE HCL 50 MG/ML IJ SOLN: 12.5000 mg | INTRAMUSCULAR | Status: DC | PRN

## 2011-10-15 MED ORDER — MORPHINE SULFATE (PF) 0.5 MG/ML IJ SOLN
INTRAMUSCULAR | Status: DC | PRN
  Administered 2011-10-15: 2 mg via INTRAVENOUS

## 2011-10-15 MED ORDER — LANOLIN HYDROUS EX OINT: 1.0000 "application " | TOPICAL_OINTMENT | CUTANEOUS | Status: DC | PRN

## 2011-10-15 MED ORDER — SIMETHICONE 80 MG PO CHEW
80.0000 mg | CHEWABLE_TABLET | ORAL | Status: DC | PRN
  Administered 2011-10-15: 80 mg via ORAL

## 2011-10-15 MED ORDER — NALBUPHINE SYRINGE 5 MG/0.5 ML
5.0000 mg | INJECTION | INTRAMUSCULAR | Status: DC | PRN
  Filled 2011-10-15: qty 1

## 2011-10-15 MED ORDER — AMPICILLIN 500 MG PO CAPS
500.0000 mg | ORAL_CAPSULE | Freq: Four times a day (QID) | ORAL | Status: DC
  Administered 2011-10-15 – 2011-10-17 (×10): 500 mg via ORAL
  Filled 2011-10-15 (×13): qty 1

## 2011-10-15 MED ORDER — METOCLOPRAMIDE HCL 5 MG/ML IJ SOLN: 10.0000 mg | Freq: Three times a day (TID) | INTRAMUSCULAR | Status: DC | PRN

## 2011-10-15 MED ORDER — OXYTOCIN 10 UNIT/ML IJ SOLN
INTRAMUSCULAR | Status: AC
  Filled 2011-10-15: qty 2

## 2011-10-15 MED ORDER — SCOPOLAMINE 1 MG/3DAYS TD PT72
MEDICATED_PATCH | TRANSDERMAL | Status: AC
  Filled 2011-10-15: qty 1

## 2011-10-15 MED ORDER — WITCH HAZEL-GLYCERIN EX PADS: 1.0000 "application " | MEDICATED_PAD | CUTANEOUS | Status: DC | PRN

## 2011-10-15 MED ORDER — DIPHENHYDRAMINE HCL 25 MG PO CAPS: 25.0000 mg | ORAL_CAPSULE | Freq: Four times a day (QID) | ORAL | Status: DC | PRN

## 2011-10-15 MED ORDER — NALOXONE HCL 0.4 MG/ML IJ SOLN: 0.4000 mg | INTRAMUSCULAR | Status: DC | PRN

## 2011-10-15 MED ORDER — ONDANSETRON HCL 4 MG/2ML IJ SOLN
INTRAMUSCULAR | Status: DC | PRN
  Administered 2011-10-15: 4 mg via INTRAVENOUS

## 2011-10-15 MED ORDER — OXYTOCIN 20 UNITS IN LACTATED RINGERS INFUSION - SIMPLE: 125.0000 mL/h | INTRAVENOUS | Status: DC

## 2011-10-15 MED ORDER — TETANUS-DIPHTH-ACELL PERTUSSIS 5-2.5-18.5 LF-MCG/0.5 IM SUSP
0.5000 mL | Freq: Once | INTRAMUSCULAR | Status: AC
  Administered 2011-10-16: 0.5 mL via INTRAMUSCULAR
  Filled 2011-10-15: qty 0.5

## 2011-10-15 MED ORDER — HYDROMORPHONE HCL PF 1 MG/ML IJ SOLN
0.2500 mg | INTRAMUSCULAR | Status: DC | PRN
  Administered 2011-10-15 (×4): 0.5 mg via INTRAVENOUS

## 2011-10-15 MED ORDER — SODIUM CHLORIDE 0.9 % IJ SOLN: 3.0000 mL | INTRAMUSCULAR | Status: DC | PRN

## 2011-10-15 MED ORDER — OXYCODONE-ACETAMINOPHEN 5-325 MG PO TABS
1.0000 | ORAL_TABLET | ORAL | Status: DC | PRN
  Administered 2011-10-15 – 2011-10-17 (×5): 2 via ORAL
  Administered 2011-10-17 – 2011-10-18 (×3): 1 via ORAL
  Filled 2011-10-15: qty 2
  Filled 2011-10-15: qty 1
  Filled 2011-10-15: qty 2
  Filled 2011-10-15 (×2): qty 1
  Filled 2011-10-15 (×3): qty 2

## 2011-10-15 MED ORDER — SCOPOLAMINE 1 MG/3DAYS TD PT72
1.0000 | MEDICATED_PATCH | Freq: Once | TRANSDERMAL | Status: DC
  Administered 2011-10-15: 1.5 mg via TRANSDERMAL

## 2011-10-15 MED ORDER — DIPHENHYDRAMINE HCL 50 MG/ML IJ SOLN: 25.0000 mg | INTRAMUSCULAR | Status: DC | PRN

## 2011-10-15 MED ORDER — DIPHENHYDRAMINE HCL 25 MG PO CAPS: 25.0000 mg | ORAL_CAPSULE | ORAL | Status: DC | PRN

## 2011-10-15 MED ORDER — CEFAZOLIN SODIUM 1-5 GM-% IV SOLN: 1.0000 g | INTRAVENOUS | Status: DC

## 2011-10-15 MED ORDER — ZOLPIDEM TARTRATE 5 MG PO TABS: 5.0000 mg | ORAL_TABLET | Freq: Every evening | ORAL | Status: DC | PRN

## 2011-10-15 MED ORDER — ONDANSETRON HCL 4 MG PO TABS: 4.0000 mg | ORAL_TABLET | ORAL | Status: DC | PRN

## 2011-10-15 MED ORDER — HYDROMORPHONE HCL PF 1 MG/ML IJ SOLN
INTRAMUSCULAR | Status: DC | PRN
  Administered 2011-10-15: 1 mg via INTRAVENOUS

## 2011-10-15 MED ORDER — MORPHINE SULFATE (PF) 0.5 MG/ML IJ SOLN
INTRAMUSCULAR | Status: DC | PRN
  Administered 2011-10-15: 3 mg via EPIDURAL

## 2011-10-15 MED ORDER — KETOROLAC TROMETHAMINE 60 MG/2ML IM SOLN
60.0000 mg | Freq: Once | INTRAMUSCULAR | Status: AC | PRN
  Administered 2011-10-15: 60 mg via INTRAMUSCULAR

## 2011-10-15 MED ORDER — SENNOSIDES-DOCUSATE SODIUM 8.6-50 MG PO TABS
2.0000 | ORAL_TABLET | Freq: Every day | ORAL | Status: DC
  Administered 2011-10-15 – 2011-10-17 (×3): 2 via ORAL

## 2011-10-15 NOTE — Addendum Note (Signed)
Addendum  created 10/15/11 1220 by Fanny Dance, CRNA   Modules edited:Notes Section

## 2011-10-15 NOTE — Anesthesia Postprocedure Evaluation (Signed)
  Anesthesia Post-op Note  Patient: Nicole [name]  Procedure(s) Performed:  CESAREAN SECTION - Primary Cesarean Section Delivery Baby Boy @ 0001, Apgars 8/9   Patient is awake, responsive, moving her legs, and has signs of resolution of her numbness. Pain and nausea are reasonably well controlled. Vital signs are stable and clinically acceptable. Oxygen saturation is clinically acceptable. There are no apparent anesthetic complications at this time. Patient is ready for discharge.

## 2011-10-15 NOTE — Op Note (Signed)
preop diagnosis failure to progress in labor and macrosomia by ultrasound postop diagnosis the same Anesthesia epidural Surgeon Dr. Francoise Ceo Procedure patient placed on the operating table in supine position abdomen prepped and draped bladder and to the Foley catheter a transverse suprapubic incision made carried down to the rectus fascia fascia cleaned and incised the length of the incision recti muscles retracted laterally peritoneum incised longitudinally transverse incision made in the visceroperitoneum above the bladder the bladder mobilized inferiorly transverse low uterine incision made the patient delivered from the OP position of a female Apgar 8 and 9 weighing 8 lbs. 4 oz. The placenta was anterior and removed manually and sent to labor and delivery uterine cavity clean with dry laps the uterine incision closed in one layer with continuous looped abnormal one chromic hemostasis satisfactory bladder flap reattached to a chromic uterus well contracted tubes and ovaries normal abdomen closed in layers peritoneum continuous with 2-0 chromic fascia continuous with of 0 Dexon and the skin shows a subcuticular stitch of 4-0 Monocryl blood loss was 700 cc patient tolerated the procedure well taken to recovery room in good condition end of dictation dictated by Dr. Gaynell Face 10/15/2011 thank you

## 2011-10-15 NOTE — Progress Notes (Signed)
UR chart review completed.  

## 2011-10-15 NOTE — Anesthesia Postprocedure Evaluation (Signed)
  Anesthesia Post-op Note  Patient: [name]  Procedure(s) Performed:  CESAREAN SECTION - Primary Cesarean Section Delivery Baby Boy @ 0001, Apgars 8/9  Patient Location: Mother/Baby  Anesthesia Type: Epidural  Level of Consciousness: alert  and oriented  Airway and Oxygen Therapy: Patient Spontanous Breathing  Post-op Pain: mild  Post-op Assessment: Patient's Cardiovascular Status Stable and Respiratory Function Stable  Post-op Vital Signs: stable  Complications: No apparent anesthesia complications

## 2011-10-15 NOTE — Transfer of Care (Signed)
Immediate Anesthesia Transfer of Care Note  Patient: Nicole [name]  Procedure(s) Performed:  CESAREAN SECTION - Primary Cesarean Section Delivery Baby Boy @ 0001, Apgars 8/9  Patient Location: PACU  Anesthesia Type: Epidural  Level of Consciousness: awake, alert  and oriented  Airway & Oxygen Therapy: Patient Spontanous Breathing and Patient connected to nasal cannula oxygen  Post-op Assessment: Report given to PACU RN and Post -op Vital signs reviewed and stable  Post vital signs: Reviewed and stable  Complications: No apparent anesthesia complications

## 2011-10-16 ENCOUNTER — Encounter (HOSPITAL_COMMUNITY): Payer: Self-pay

## 2011-10-16 NOTE — Progress Notes (Signed)
Patient ID: Nicole [name], female   DOB: [birth year], 21 y.o.   MRN: [id number] Postop day 2 Vital signs normal Incision clean Fundus firm Legs negative Doing well

## 2011-10-18 MED ORDER — PRENATAL RX 60-1 MG PO TABS: 1.0000 | ORAL_TABLET | Freq: Every day | ORAL | Status: AC

## 2011-10-18 MED ORDER — FERROUS SULFATE 325 (65 FE) MG PO TABS: 325.0000 mg | ORAL_TABLET | Freq: Two times a day (BID) | ORAL | Status: DC

## 2011-10-18 MED ORDER — OXYCODONE-ACETAMINOPHEN 5-325 MG PO TABS: 2.0000 | ORAL_TABLET | Freq: Four times a day (QID) | ORAL | Status: AC | PRN

## 2011-10-18 NOTE — Progress Notes (Signed)
Patient ID: Nicole [name], female   DOB: 12/22/1990, 21 y.o.   MRN: [id number] Subjective: POD #3 s/p LTC/S  Indication:failure to progress Patient reports tolerating PO.  Denies HA/SOB/C/P/N/V/dizziness.  Reports flatus or BM. Breast symptoms: no  She reports vaginal bleeding as normal, without clots.  She is ambulating, urinating without difficulty.     Objective: Vital signs in last 24 hours: BP 128/83  Pulse 86  Temp(Src) 98 F (36.7 C) (Oral)  Resp 20  Ht 5\' 3"  (1.6 m)  Wt 102.059 kg (225 lb)  BMI 39.86 kg/m2  SpO2 99%  LMP 01/06/2011  Breastfeeding? Unknown  Physical Exam:  General: alert CV: Regular rate and rhythm Resp: clear Abdomen: soft, nontender, normal bowel sounds Lochia: minimal Uterine Fundus: firm, below umbilicus, nontender Incision: clean, dry and intact Ext: extremities normal, atraumatic, no cyanosis or edema No results found for this basename: HGB:2,HCT:2 in the last 72 hours  Assessment/Plan: 21 y.o. status post Cesarean section POD# 3.  normal post-operative exam  Routine post-op care D/C home  JACKSON-MOORE,Sible Straley A 10/18/2011, 11:03 AM

## 2011-10-18 NOTE — Discharge Summary (Signed)
Obstetric Discharge Summary Reason for Admission: induction of labor Prenatal Procedures: none Intrapartum Procedures: cesarean: low cervical, transverse Postpartum Procedures: none Complications-Operative and Postpartum: none Hemoglobin  Date Value Range Status  10/15/2011 7.8* 12.0-15.0 (g/dL) Final     HCT  Date Value Range Status  10/15/2011 23.8* 36.0-46.0 (%) Final    Discharge Diagnoses: Term Pregnancy-delivered and Anemia  Discharge Information: Date: 10/18/2011 Activity: pelvic rest Diet: routine Medications: PNV, Ibuprofen, Iron and Percocet Condition: stable Instructions: see above Discharge to: home Follow-up Information    Follow up with MARSHALL,BERNARD A, MD. (call next week for BP check)    Contact information:   [address] [place] [address] [phone]          Newborn Data: Live born female  Birth Weight: 8 lb 4.3 oz (3751 g) APGAR: 8, 9  Home with mother.  JACKSON-MOORE,Mohsen Odenthal A 10/18/2011, 10:58 AM

## 2013-05-18 ENCOUNTER — Emergency Department (HOSPITAL_COMMUNITY): Payer: Self-pay

## 2013-05-18 ENCOUNTER — Encounter (HOSPITAL_COMMUNITY): Payer: Self-pay | Admitting: Emergency Medicine

## 2013-05-18 ENCOUNTER — Emergency Department (HOSPITAL_COMMUNITY)
Admission: EM | Admit: 2013-05-18 | Discharge: 2013-05-18 | Disposition: A | Discharge status: Home / Self Care | Payer: Self-pay | Attending: Emergency Medicine | Admitting: Emergency Medicine

## 2013-05-18 DIAGNOSIS — Z9889 Other specified postprocedural states: Secondary | ICD-10-CM | POA: Insufficient documentation

## 2013-05-18 DIAGNOSIS — N949 Unspecified condition associated with female genital organs and menstrual cycle: Secondary | ICD-10-CM | POA: Insufficient documentation

## 2013-05-18 DIAGNOSIS — R350 Frequency of micturition: Secondary | ICD-10-CM | POA: Insufficient documentation

## 2013-05-18 DIAGNOSIS — R109 Unspecified abdominal pain: Secondary | ICD-10-CM | POA: Insufficient documentation

## 2013-05-18 DIAGNOSIS — R300 Dysuria: Secondary | ICD-10-CM | POA: Insufficient documentation

## 2013-05-18 DIAGNOSIS — Z3490 Encounter for supervision of normal pregnancy, unspecified, unspecified trimester: Secondary | ICD-10-CM

## 2013-05-18 DIAGNOSIS — O0001 Abdominal pregnancy with intrauterine pregnancy: Secondary | ICD-10-CM | POA: Insufficient documentation

## 2013-05-18 DIAGNOSIS — O9989 Other specified diseases and conditions complicating pregnancy, childbirth and the puerperium: Secondary | ICD-10-CM | POA: Insufficient documentation

## 2013-05-18 LAB — CBC WITH DIFFERENTIAL/PLATELET
Eosinophils Relative: 4 % (ref 0–5)
HCT: 34.3 % — ABNORMAL LOW (ref 36.0–46.0)
Hemoglobin: 11.3 g/dL — ABNORMAL LOW (ref 12.0–15.0)
Lymphocytes Relative: 38 % (ref 12–46)
MCV: 80.5 fL (ref 78.0–100.0)
Monocytes Absolute: 0.5 10*3/uL (ref 0.1–1.0)
Monocytes Relative: 9 % (ref 3–12)
Neutro Abs: 2.7 10*3/uL (ref 1.7–7.7)
RDW: 14.6 % (ref 11.5–15.5)
WBC: 5.5 10*3/uL (ref 4.0–10.5)

## 2013-05-18 LAB — POCT I-STAT, CHEM 8
BUN: 5 mg/dL — ABNORMAL LOW (ref 6–23)
Calcium, Ion: 1.26 mmol/L — ABNORMAL HIGH (ref 1.12–1.23)
Chloride: 105 mEq/L (ref 96–112)
Creatinine, Ser: 0.7 mg/dL (ref 0.50–1.10)
Glucose, Bld: 90 mg/dL (ref 70–99)
HCT: 38 % (ref 36.0–46.0)
Potassium: 3.5 mEq/L (ref 3.5–5.1)

## 2013-05-18 LAB — URINALYSIS, ROUTINE W REFLEX MICROSCOPIC
Bilirubin Urine: NEGATIVE
Glucose, UA: NEGATIVE mg/dL
Hgb urine dipstick: NEGATIVE
Ketones, ur: NEGATIVE mg/dL
Specific Gravity, Urine: 1.012 (ref 1.005–1.030)
pH: 6.5 (ref 5.0–8.0)

## 2013-05-18 LAB — HCG, QUANTITATIVE, PREGNANCY: hCG, Beta Chain, Quant, S: 9028 m[IU]/mL — ABNORMAL HIGH (ref <5)

## 2013-05-18 LAB — WET PREP, GENITAL
Trich, Wet Prep: NONE SEEN
Yeast Wet Prep HPF POC: NONE SEEN

## 2013-05-18 MED ORDER — SODIUM CHLORIDE 0.9 % IV SOLN
Freq: Once | INTRAVENOUS | Status: AC
  Administered 2013-05-18: 16:00:00 via INTRAVENOUS

## 2013-05-18 NOTE — ED Notes (Signed)
Pt reports right side pain that comes and goes.  Pain 7/10 currnet.  Pt states she feels pain in her side as well as a burning when she urinates.  Pt alert oriented X4

## 2013-05-18 NOTE — ED Provider Notes (Signed)
CSN: [id number]     Arrival date & time 05/18/13  1152 History   First MD Initiated Contact with Patient 05/18/13 1457     Chief Complaint  Patient presents with  . Abdominal Pain   (Consider location/radiation/quality/duration/timing/severity/associated sxs/prior Treatment) Patient is a 22 y.o. female presenting with abdominal pain. The history is provided by the patient. No language interpreter was used.  Abdominal Pain Associated symptoms: dysuria   Associated symptoms: no chills, no fatigue and no fever    Pt reports ongoing abdominal and right sided pain for the last 3 days. Pain has been sharp and waxing and waning. She has had some increased pressure and frequency with urination, denies burning and hematuria. She states that her last menstrual period was 04/08/2009. Last intercourse was approx 1 month ago and she used a condom. Denies fever, nausea, vomiting and diarrhea. Denies any change in bowel movements, reports they are every other day, formed and soft.   Past Medical History  Diagnosis Date  . No pertinent past medical history    Past Surgical History  Procedure Laterality Date  . Wisdom tooth extraction  2010  . Cesarean section  10/14/2011    Procedure: CESAREAN SECTION;  Surgeon: Kathreen Cosier, MD;  Location: [facility];  Service: Gynecology;  Laterality: N/A;  Primary Cesarean Section Delivery Baby Boy @ 0001, Apgars 8/9   History reviewed. No pertinent family history. History  Substance Use Topics  . Smoking status: Never Smoker   . Smokeless tobacco: Never Used  . Alcohol Use: No   OB History   Grav Para Term Preterm Abortions TAB SAB Ect Mult Living   2 2 2  0 0 0 0 0 0 2     Review of Systems  Constitutional: Negative for fever, chills, activity change, appetite change and fatigue.  HENT: Negative.   Respiratory: Negative.   Cardiovascular: Negative.   Gastrointestinal: Positive for abdominal pain.  Genitourinary: Positive for dysuria, frequency and  pelvic pain.  All other systems reviewed and are negative.    Allergies  Review of patient's allergies indicates no known allergies.  Home Medications  No current outpatient prescriptions on file. BP 145/78  Pulse 83  Temp(Src) 98.3 F (36.8 C) (Oral)  Resp 18  Ht 5\' 5"  (1.651 m)  Wt 163 lb (73.936 kg)  BMI 27.12 kg/m2  SpO2 100%  LMP 04/08/2013 Physical Exam  Nursing note and vitals reviewed. Constitutional: She is oriented to person, place, and time. She appears well-developed and well-nourished.  HENT:  Head: Normocephalic.  Neck: Normal range of motion.  Cardiovascular: Normal rate and regular rhythm.   Pulmonary/Chest: Effort normal and breath sounds normal.  Abdominal: Soft. There is no hepatosplenomegaly. There is no rebound, no guarding, no CVA tenderness and no tenderness at McBurney's point. Hernia confirmed negative in the right inguinal area.  RLQ and suprapubic tenderness  Genitourinary: Uterus normal. Cervix exhibits motion tenderness and discharge. Right adnexum displays tenderness. Vaginal discharge found.  Musculoskeletal: Normal range of motion.  Neurological: She is alert and oriented to person, place, and time.  Psychiatric: She has a normal mood and affect. Her speech is normal and behavior is normal. Judgment and thought content normal.  Vaginal discharge whitish and mild cervical motion tenderness.   ED Course  Procedures (including critical care time) Labs Review Labs Reviewed  CBC WITH DIFFERENTIAL  URINALYSIS, ROUTINE W REFLEX MICROSCOPIC   Imaging Review No results found.  MDM   1. Intrauterine pregnancy    Plan:  Follow-up visit in 24 hours Schedule prenatal visit with OB/GYN     Irish Elders, NP 05/18/13 1825  Irish Elders, NP 05/18/13 [phone]

## 2013-05-18 NOTE — ED Notes (Signed)
Pt reports pain in RLQ of abdomen for the last 3 days. States pain comes and goes. Denies nausea, vomiting, diarhrea, and fever. Pt reports some dysuria.

## 2013-05-18 NOTE — ED Provider Notes (Signed)
22 year old female, G4 P2, 1 history of miscarriage, one history of vaginal birth, 1 history of cesarean. She presents with a complaint of right side and right lower back pain. This has been intermittent for the last 2 days, worsened today, no associated nausea, diarrhea, dysuria or vaginal bleeding. On my exam she has tenderness over the right side, minimal over the right lower back, minimal over the right lower quadrant though the most pain she is having is in the right lower quadrant and adnexa. Pelvic exam deferred to nurse practitioner, see separate note. She is nontoxic appearing, normal heart rate, no tachycardia, no hypertension, normal capillary refill, normal pulses. As her pregnancy test is positive she will need evaluation for ectopic pregnancy, given the intermittent nature of her pain this is more likely than acute appendicitis or kidney stone. Urinalysis pending as well.  Medical screening examination/treatment/procedure(s) were conducted as a shared visit with non-physician practitioner(s) and myself.  I personally evaluated the patient during the encounter.  Clinical Impression: Abdominal Pain, Pregnancy      Vida Roller, MD 05/20/13 [phone]

## 2013-05-19 LAB — GC/CHLAMYDIA PROBE AMP
CT Probe RNA: NEGATIVE
GC Probe RNA: NEGATIVE

## 2013-05-20 NOTE — ED Provider Notes (Signed)
Medical screening examination/treatment/procedure(s) were conducted as a shared visit with non-physician practitioner(s) and myself.  I personally evaluated the patient during the encounter  Please see my separate respective documentation pertaining to this patient encounter   Vida Roller, MD 05/20/13 [phone]

## 2013-05-30 ENCOUNTER — Inpatient Hospital Stay (HOSPITAL_COMMUNITY)
Admission: AD | Admit: 2013-05-30 | Discharge: 2013-05-30 | Disposition: A | Discharge status: Home / Self Care | Payer: Self-pay | Source: Ambulatory Visit | Attending: Obstetrics and Gynecology | Admitting: Obstetrics and Gynecology

## 2013-05-30 ENCOUNTER — Encounter (HOSPITAL_COMMUNITY): Payer: Self-pay

## 2013-05-30 DIAGNOSIS — O99891 Other specified diseases and conditions complicating pregnancy: Secondary | ICD-10-CM | POA: Insufficient documentation

## 2013-05-30 DIAGNOSIS — R109 Unspecified abdominal pain: Secondary | ICD-10-CM | POA: Insufficient documentation

## 2013-05-30 DIAGNOSIS — N949 Unspecified condition associated with female genital organs and menstrual cycle: Secondary | ICD-10-CM

## 2013-05-30 DIAGNOSIS — O210 Mild hyperemesis gravidarum: Secondary | ICD-10-CM | POA: Insufficient documentation

## 2013-05-30 LAB — URINALYSIS, ROUTINE W REFLEX MICROSCOPIC
Glucose, UA: NEGATIVE mg/dL
Hgb urine dipstick: NEGATIVE
Ketones, ur: NEGATIVE mg/dL
Protein, ur: NEGATIVE mg/dL

## 2013-05-30 MED ORDER — CYCLOBENZAPRINE HCL 10 MG PO TABS
10.0000 mg | ORAL_TABLET | Freq: Once | ORAL | Status: AC
  Administered 2013-05-30: 10 mg via ORAL
  Filled 2013-05-30: qty 1

## 2013-05-30 MED ORDER — CYCLOBENZAPRINE HCL 10 MG PO TABS: 10.0000 mg | ORAL_TABLET | Freq: Two times a day (BID) | ORAL | Status: DC | PRN

## 2013-05-30 MED ORDER — ACETAMINOPHEN 500 MG PO TABS
1000.0000 mg | ORAL_TABLET | Freq: Once | ORAL | Status: AC
  Administered 2013-05-30: 1000 mg via ORAL
  Filled 2013-05-30: qty 2

## 2013-05-30 NOTE — MAU Provider Note (Signed)
History     CSN: [id number]  Arrival date and time: 05/30/13 1325   First Provider Initiated Contact with Patient 05/30/13 1403      Chief Complaint  Patient presents with  . Abdominal Pain  . Emesis During Pregnancy   HPI Ms. Nicole [name] is a 22 y.o. G3P2002 at [redacted]w[redacted]d who presents to MAU today with complaint of lower abdominal "pulling" pain. The patient states that this pain is the same as when she was in [facility] recently. The patient had a normal [place] at that time with a living IUP. The patient is also having +N/V most days. The patient states that the pain is worse with changes of positions and laying on her side. She states that the pain is improved when laying flat on her back and on her stomach. The patient denies diarrhea, constipation, vaginal bleeding, discharge, LOF or fever. The patient plans to start care with Dr. Gaynell Face.   OB History   Grav Para Term Preterm Abortions TAB SAB Ect Mult Living   3 2 2  0 0 0 0 0 0 2      Past Medical History  Diagnosis Date  . No pertinent past medical history     Past Surgical History  Procedure Laterality Date  . Wisdom tooth extraction  2010  . Cesarean section  10/14/2011    Procedure: CESAREAN SECTION;  Surgeon: Kathreen Cosier, MD;  Location: [facility];  Service: Gynecology;  Laterality: N/A;  Primary Cesarean Section Delivery Baby Boy @ 0001, Apgars 8/9    History reviewed. No pertinent family history.  History  Substance Use Topics  . Smoking status: Never Smoker   . Smokeless tobacco: Never Used  . Alcohol Use: No    Allergies: No Known Allergies  No prescriptions prior to admission    Review of Systems  Constitutional: Negative for fever and malaise/fatigue.  Gastrointestinal: Positive for nausea, vomiting and abdominal pain. Negative for diarrhea and constipation.  Genitourinary: Negative for dysuria, urgency and frequency.       Neg - vaginal discharge, bleeding, LOF   Physical Exam   Blood pressure 117/79,  pulse 68, temperature 98 F (36.7 C), temperature source Oral, resp. rate 16, height 5\' 4"  (1.626 m), weight 167 lb 6.4 oz (75.932 kg), last menstrual period 04/08/2013, SpO2 100.00%.  Physical Exam  Constitutional: She is oriented to person, place, and time. She appears well-developed and well-nourished. No distress.  HENT:  Head: Normocephalic and atraumatic.  Cardiovascular: Normal rate, regular rhythm and normal heart sounds.   Respiratory: Effort normal and breath sounds normal. No respiratory distress.  GI: Soft. Bowel sounds are normal. She exhibits no distension and no mass. There is tenderness (mild tenderness to palpation of the lower abdomen bilaterally). There is no rebound and no guarding.  Neurological: She is alert and oriented to person, place, and time.  Skin: Skin is warm and dry. No erythema.  Psychiatric: She has a normal mood and affect.   Results for orders placed during the hospital encounter of 05/30/13 (from the past 24 hour(s))  URINALYSIS, ROUTINE W REFLEX MICROSCOPIC     Status: None   Collection Time    05/30/13  1:45 PM      Result Value Range   Color, Urine YELLOW  YELLOW   APPearance CLEAR  CLEAR   Specific Gravity, Urine 1.025  1.005 - 1.030   pH 6.5  5.0 - 8.0   Glucose, UA NEGATIVE  NEGATIVE mg/dL   Hgb urine  dipstick NEGATIVE  NEGATIVE   Bilirubin Urine NEGATIVE  NEGATIVE   Ketones, ur NEGATIVE  NEGATIVE mg/dL   Protein, ur NEGATIVE  NEGATIVE mg/dL   Urobilinogen, UA 1.0  0.0 - 1.0 mg/dL   Nitrite NEGATIVE  NEGATIVE   Leukocytes, UA NEGATIVE  NEGATIVE    MAU Course  Procedures None  MDM Flexeril and tylenol given in mau- patient reports complete resolution of pain  Assessment and Plan  A: Round ligament pain  P: Discharge home Rx for Flexeril sent to patient's pharmacy Recommended warm bath/shower, abdominal binder daily PRN and Tylenol PRN for pain Follow-up with Dr. Gaynell Face to start prenatal care ASAP Pregnancy confirmation  letter given Patient may return to MAU as needed or if her condition were to change or worsen  Freddi Starr, PA-C  05/30/2013, 4:33 PM

## 2013-05-30 NOTE — MAU Note (Signed)
Patient states she has been having lower abdominal pulling pain for about one week. Denies bleeding or vaginal discharge. Has nausea and vomiting every day.

## 2013-06-01 NOTE — MAU Provider Note (Signed)
Attestation of Attending Supervision of Advanced Practitioner (CNM/NP): Evaluation and management procedures were performed by the Advanced Practitioner under my supervision and collaboration.  I have reviewed the Advanced Practitioner's note and chart, and I agree with the management and plan.  Lorene Klimas 06/01/2013 10:10 AM

## 2013-08-19 LAB — OB RESULTS CONSOLE GC/CHLAMYDIA
Chlamydia: NEGATIVE
GC PROBE AMP, GENITAL: NEGATIVE

## 2013-08-19 LAB — OB RESULTS CONSOLE ABO/RH: RH Type: POSITIVE

## 2013-08-19 LAB — OB RESULTS CONSOLE ANTIBODY SCREEN: ANTIBODY SCREEN: NEGATIVE

## 2013-08-19 LAB — OB RESULTS CONSOLE RUBELLA ANTIBODY, IGM: Rubella: IMMUNE

## 2013-08-19 LAB — OB RESULTS CONSOLE HEPATITIS B SURFACE ANTIGEN: Hepatitis B Surface Ag: NEGATIVE

## 2013-08-19 LAB — OB RESULTS CONSOLE HIV ANTIBODY (ROUTINE TESTING): HIV: NONREACTIVE

## 2013-08-19 LAB — OB RESULTS CONSOLE RPR: RPR: NONREACTIVE

## 2013-09-08 NOTE — L&D Delivery Note (Signed)
Delivery Note At 7:43 AM a viable female was delivered via  (Presentation: ;  ).  APGAR: , ; weight .   Placenta status: , .  Cord:  with the following complications: .  Cord pH: not done  Anesthesia: Epidural  Episiotomy:  Lacerations:  Suture Repair: 2.0 Est. Blood Loss (mL):   Mom to postpartum.  Baby to Couplet care / Skin to Skin.  Kathreen CosierBernard A Marshall 01/04/2014, 7:50 AM

## 2013-12-08 LAB — OB RESULTS CONSOLE GBS
GBS: NEGATIVE
STREP GROUP B AG: NEGATIVE

## 2014-01-01 ENCOUNTER — Inpatient Hospital Stay (HOSPITAL_COMMUNITY)
Admission: AD | Admit: 2014-01-01 | Discharge: 2014-01-02 | Disposition: A | Discharge status: Home / Self Care | Payer: Medicaid Other | Source: Ambulatory Visit | Attending: Obstetrics | Admitting: Obstetrics

## 2014-01-01 ENCOUNTER — Encounter (HOSPITAL_COMMUNITY): Payer: Self-pay

## 2014-01-01 DIAGNOSIS — O34219 Maternal care for unspecified type scar from previous cesarean delivery: Secondary | ICD-10-CM | POA: Insufficient documentation

## 2014-01-01 DIAGNOSIS — O133 Gestational [pregnancy-induced] hypertension without significant proteinuria, third trimester: Secondary | ICD-10-CM

## 2014-01-01 DIAGNOSIS — O479 False labor, unspecified: Secondary | ICD-10-CM | POA: Insufficient documentation

## 2014-01-01 DIAGNOSIS — O139 Gestational [pregnancy-induced] hypertension without significant proteinuria, unspecified trimester: Secondary | ICD-10-CM | POA: Insufficient documentation

## 2014-01-01 DIAGNOSIS — O212 Late vomiting of pregnancy: Secondary | ICD-10-CM | POA: Insufficient documentation

## 2014-01-01 HISTORY — DX: Gestational (pregnancy-induced) hypertension without significant proteinuria, unspecified trimester: O13.9

## 2014-01-01 LAB — URINALYSIS, ROUTINE W REFLEX MICROSCOPIC
Bilirubin Urine: NEGATIVE
Glucose, UA: NEGATIVE mg/dL
HGB URINE DIPSTICK: NEGATIVE
Ketones, ur: NEGATIVE mg/dL
Leukocytes, UA: NEGATIVE
NITRITE: NEGATIVE
PROTEIN: NEGATIVE mg/dL
SPECIFIC GRAVITY, URINE: 1.025 (ref 1.005–1.030)
UROBILINOGEN UA: 0.2 mg/dL (ref 0.0–1.0)
pH: 6 (ref 5.0–8.0)

## 2014-01-01 LAB — COMPREHENSIVE METABOLIC PANEL
ALBUMIN: 2.4 g/dL — AB (ref 3.5–5.2)
ALT: 12 U/L (ref 0–35)
AST: 18 U/L (ref 0–37)
Alkaline Phosphatase: 141 U/L — ABNORMAL HIGH (ref 39–117)
BUN: 7 mg/dL (ref 6–23)
CALCIUM: 8.4 mg/dL (ref 8.4–10.5)
CHLORIDE: 102 meq/L (ref 96–112)
CO2: 22 mEq/L (ref 19–32)
CREATININE: 0.4 mg/dL — AB (ref 0.50–1.10)
GFR calc Af Amer: >90 mL/min (ref >90)
GFR calc non Af Amer: >90 mL/min (ref >90)
Glucose, Bld: 84 mg/dL (ref 70–99)
Potassium: 3.6 mEq/L — ABNORMAL LOW (ref 3.7–5.3)
SODIUM: 136 meq/L — AB (ref 137–147)
Total Bilirubin: <0.2 mg/dL — ABNORMAL LOW (ref 0.3–1.2)
Total Protein: 6.1 g/dL (ref 6.0–8.3)

## 2014-01-01 LAB — CBC
HCT: 25.9 % — ABNORMAL LOW (ref 36.0–46.0)
Hemoglobin: 8.2 g/dL — ABNORMAL LOW (ref 12.0–15.0)
MCH: 23.2 pg — AB (ref 26.0–34.0)
MCHC: 31.7 g/dL (ref 30.0–36.0)
MCV: 73.4 fL — AB (ref 78.0–100.0)
PLATELETS: 258 10*3/uL (ref 150–400)
RBC: 3.53 MIL/uL — AB (ref 3.87–5.11)
RDW: 16.2 % — AB (ref 11.5–15.5)
WBC: 8.4 10*3/uL (ref 4.0–10.5)

## 2014-01-01 LAB — LACTATE DEHYDROGENASE: LDH: 182 U/L (ref 94–250)

## 2014-01-01 LAB — URIC ACID: URIC ACID, SERUM: 4.1 mg/dL (ref 2.4–7.0)

## 2014-01-01 MED ORDER — PROMETHAZINE HCL 25 MG PO TABS
25.0000 mg | ORAL_TABLET | Freq: Once | ORAL | Status: AC
  Administered 2014-01-01: 25 mg via ORAL
  Filled 2014-01-01: qty 1

## 2014-01-01 NOTE — MAU Note (Signed)
Pt G3 P2 at 38.2wks having contractions every 6-637min, vomiting, unable to keep anything down today.

## 2014-01-02 DIAGNOSIS — O139 Gestational [pregnancy-induced] hypertension without significant proteinuria, unspecified trimester: Secondary | ICD-10-CM

## 2014-01-02 LAB — PROTEIN / CREATININE RATIO, URINE
CREATININE, URINE: 101.01 mg/dL
PROTEIN CREATININE RATIO: 0.19 — AB (ref 0.00–0.15)
Total Protein, Urine: 18.8 mg/dL

## 2014-01-02 NOTE — Discharge Instructions (Signed)
Hypertension During Pregnancy Hypertension is also called high blood pressure. It can occur at any time in life and during pregnancy. When you have hypertension, there is extra pressure inside your blood vessels that carry blood from the heart to the rest of your body (arteries). Hypertension during pregnancy can cause problems for you and your baby. Your baby might not weigh as much as it should at birth or might be born early (premature). Very bad cases of hypertension during pregnancy can be life threatening.  Different types of hypertension can occur during pregnancy.   Chronic hypertension. This happens when a woman has hypertension before pregnancy and it continues during pregnancy.  Gestational hypertension. This is when hypertension develops during pregnancy.  Preeclampsia or toxemia of pregnancy. This is a very serious type of hypertension that develops only during pregnancy. It is a disease that affects the whole body (systemic) and can be very dangerous for both mother and baby.  Gestational hypertension and preeclampsia usually go away after your baby is born. Blood pressure generally stabilizes within 6 weeks. Women who have hypertension during pregnancy have a greater chance of developing hypertension later in life or with future pregnancies. RISK FACTORS Some factors make you more likely to develop hypertension during pregnancy. Risk factors include:  Having hypertension before pregnancy.  Having hypertension during a previous pregnancy.  Being overweight.  Being older than 57.  Being pregnant with more than one baby (multiples).  Having diabetes or kidney problems. SIGNS AND SYMPTOMS Chronic and gestational hypertension rarely cause symptoms. Preeclampsia has symptoms, which may include:  Increased protein in your urine. Your health care provider will check for this at every prenatal visit.  Swelling of your hands and face.  Rapid weight gain.  Headaches.  Visual  changes.  Being bothered by light.  Abdominal pain, especially in the right upper area.  Chest pain.  Shortness of breath.  Increased reflexes.  Seizures. Seizures occur with a more severe form of preeclampsia, called eclampsia. DIAGNOSIS   You may be diagnosed with hypertension during a regular prenatal exam. At each visit, tests may include:  Blood pressure checks.  A urine test to check for protein in your urine.  The type of hypertension you are diagnosed with depends on when you developed it. It also depends on your specific blood pressure reading.  Developing hypertension before 20 weeks of pregnancy is consistent with chronic hypertension.  Developing hypertension after 20 weeks of pregnancy is consistent with gestational hypertension.  Hypertension with increased urinary protein is diagnosed as preeclampsia.  Blood pressure measurements that stay above 696 systolic or 789 diastolic are a sign of severe preeclampsia. TREATMENT Treatment for hypertension during pregnancy varies. Treatment depends on the type of hypertension and how serious it is.  If you take medicine for chronic hypertension, you may need to switch medicines.  Drugs called ACE inhibitors should not be taken during pregnancy.  Low-dose aspirin may be suggested for women who have risk factors for preeclampsia.  If you have gestational hypertension, you may need to take a blood pressure medicine that is safe during pregnancy. Your health care provider will recommend the appropriate medicine.  If you have severe preeclampsia, you may need to be in the hospital. Health care providers will watch you and the baby very closely. You also may need to take medicine (magnesium sulfate) to prevent seizures and lower blood pressure.  Sometimes an early delivery is needed. This may be the case if the condition worsens. It would  be done to protect you and the baby. The only cure for preeclampsia is delivery. HOME  CARE INSTRUCTIONS  Schedule and keep all of your regular appointments for prenatal care.  Only take over-the-counter or prescription medicines as directed by your health care provider. Tell your health care provider about all medicines you take.  Eat as little salt as possible.  Get regular exercise.  Do not drink alcohol.  Do not use tobacco products.  Do not drink products with caffeine.  Lie on your left side when resting. SEEK IMMEDIATE MEDICAL CARE IF:  You have severe abdominal pain.  You have sudden swelling in the hands, ankles, or face.  You gain 4 pounds (1.8 kg) or more in 1 week.  You vomit repeatedly.  You have vaginal bleeding.  You do not feel the baby moving as much.  You have a headache.  You have blurred or double vision.  You have muscle twitching or spasms.  You have shortness of breath.  You have blue fingernails and lips.  You have blood in your urine. MAKE SURE YOU:  Understand these instructions.  Will watch your condition.  Will get help right away if you are not doing well or get worse. Document Released: 05/13/2011 Document Revised: 06/15/2013 Document Reviewed: 03/24/2013 [facility] Patient Information 2014 [place], [place].   Fetal Movement Counts Patient Name: __________________________________________________ Patient Due Date: ____________________ Performing a fetal movement count is highly recommended in high-risk pregnancies, but it is good for every pregnant woman to do. Your caregiver may ask you to start counting fetal movements at 28 weeks of the pregnancy. Fetal movements often increase:  After eating a full meal.  After physical activity.  After eating or drinking something sweet or cold.  At rest. Pay attention to when you feel the baby is most active. This will help you notice a pattern of your baby's sleep and wake cycles and what factors contribute to an increase in fetal movement. It is important to perform a  fetal movement count at the same time each day when your baby is normally most active.  HOW TO COUNT FETAL MOVEMENTS 1. Find a quiet and comfortable area to sit or lie down on your left side. Lying on your left side provides the best blood and oxygen circulation to your baby. 2. Write down the day and time on a sheet of paper or in a journal. 3. Start counting kicks, flutters, swishes, rolls, or jabs in a 2 hour period. You should feel at least 10 movements within 2 hours. 4. If you do not feel 10 movements in 2 hours, wait 2 3 hours and count again. Look for a change in the pattern or not enough counts in 2 hours. SEEK MEDICAL CARE IF:  You feel less than 10 counts in 2 hours, tried twice.  There is no movement in over an hour.  The pattern is changing or taking longer each day to reach 10 counts in 2 hours.  You feel the baby is not moving as he or she usually does. Date: ____________ Movements: ____________ Start time: ____________ Nicole MartinFinish time: ____________  Date: ____________ Movements: ____________ Start time: ____________ Nicole MartinFinish time: ____________ Date: ____________ Movements: ____________ Start time: ____________ Nicole MartinFinish time: ____________ Date: ____________ Movements: ____________ Start time: ____________ Nicole MartinFinish time: ____________ Date: ____________ Movements: ____________ Start time: ____________ Nicole MartinFinish time: ____________ Date: ____________ Movements: ____________ Start time: ____________ Nicole MartinFinish time: ____________ Date: ____________ Movements: ____________ Start time: ____________ Nicole MartinFinish time: ____________ Date: ____________ Movements: ____________ Start  time: ____________ Nicole MartinFinish time: ____________  Date: ____________ Movements: ____________ Start time: ____________ Nicole MartinFinish time: ____________ Date: ____________ Movements: ____________ Start time: ____________ Nicole MartinFinish time: ____________ Date: ____________ Movements: ____________ Start time: ____________ Nicole MartinFinish time: ____________ Date:  ____________ Movements: ____________ Start time: ____________ Nicole MartinFinish time: ____________ Date: ____________ Movements: ____________ Start time: ____________ Nicole MartinFinish time: ____________ Date: ____________ Movements: ____________ Start time: ____________ Nicole MartinFinish time: ____________ Date: ____________ Movements: ____________ Start time: ____________ Nicole MartinFinish time: ____________  Date: ____________ Movements: ____________ Start time: ____________ Nicole MartinFinish time: ____________ Date: ____________ Movements: ____________ Start time: ____________ Nicole MartinFinish time: ____________ Date: ____________ Movements: ____________ Start time: ____________ Nicole MartinFinish time: ____________ Date: ____________ Movements: ____________ Start time: ____________ Nicole MartinFinish time: ____________ Date: ____________ Movements: ____________ Start time: ____________ Nicole MartinFinish time: ____________ Date: ____________ Movements: ____________ Start time: ____________ Nicole MartinFinish time: ____________ Date: ____________ Movements: ____________ Start time: ____________ Nicole MartinFinish time: ____________  Date: ____________ Movements: ____________ Start time: ____________ Nicole MartinFinish time: ____________ Date: ____________ Movements: ____________ Start time: ____________ Nicole MartinFinish time: ____________ Date: ____________ Movements: ____________ Start time: ____________ Nicole MartinFinish time: ____________ Date: ____________ Movements: ____________ Start time: ____________ Nicole MartinFinish time: ____________ Date: ____________ Movements: ____________ Start time: ____________ Nicole MartinFinish time: ____________ Date: ____________ Movements: ____________ Start time: ____________ Nicole MartinFinish time: ____________ Date: ____________ Movements: ____________ Start time: ____________ Nicole MartinFinish time: ____________  Date: ____________ Movements: ____________ Start time: ____________ Nicole MartinFinish time: ____________ Date: ____________ Movements: ____________ Start time: ____________ Nicole MartinFinish time: ____________ Date: ____________ Movements: ____________ Start  time: ____________ Nicole MartinFinish time: ____________ Date: ____________ Movements: ____________ Start time: ____________ Nicole MartinFinish time: ____________ Date: ____________ Movements: ____________ Start time: ____________ Nicole MartinFinish time: ____________ Date: ____________ Movements: ____________ Start time: ____________ Nicole MartinFinish time: ____________ Date: ____________ Movements: ____________ Start time: ____________ Nicole MartinFinish time: ____________  Date: ____________ Movements: ____________ Start time: ____________ Nicole MartinFinish time: ____________ Date: ____________ Movements: ____________ Start time: ____________ Nicole MartinFinish time: ____________ Date: ____________ Movements: ____________ Start time: ____________ Nicole MartinFinish time: ____________ Date: ____________ Movements: ____________ Start time: ____________ Nicole MartinFinish time: ____________ Date: ____________ Movements: ____________ Start time: ____________ Nicole MartinFinish time: ____________ Date: ____________ Movements: ____________ Start time: ____________ Nicole MartinFinish time: ____________ Date: ____________ Movements: ____________ Start time: ____________ Nicole MartinFinish time: ____________  Date: ____________ Movements: ____________ Start time: ____________ Nicole MartinFinish time: ____________ Date: ____________ Movements: ____________ Start time: ____________ Nicole MartinFinish time: ____________ Date: ____________ Movements: ____________ Start time: ____________ Nicole MartinFinish time: ____________ Date: ____________ Movements: ____________ Start time: ____________ Nicole MartinFinish time: ____________ Date: ____________ Movements: ____________ Start time: ____________ Nicole MartinFinish time: ____________ Date: ____________ Movements: ____________ Start time: ____________ Nicole MartinFinish time: ____________ Date: ____________ Movements: ____________ Start time: ____________ Nicole MartinFinish time: ____________  Date: ____________ Movements: ____________ Start time: ____________ Nicole MartinFinish time: ____________ Date: ____________ Movements: ____________ Start time: ____________ Nicole MartinFinish time:  ____________ Date: ____________ Movements: ____________ Start time: ____________ Nicole MartinFinish time: ____________ Date: ____________ Movements: ____________ Start time: ____________ Nicole MartinFinish time: ____________ Date: ____________ Movements: ____________ Start time: ____________ Nicole MartinFinish time: ____________ Date: ____________ Movements: ____________ Start time: ____________ Nicole MartinFinish time: ____________ Document Released: 09/24/2006 Document Revised: 08/11/2012 Document Reviewed: 06/21/2012 [employer] Patient Information 2014 [place] [place].

## 2014-01-02 NOTE — MAU Provider Note (Signed)
History     CSN: [id number]  Arrival date and time: 01/01/14 2212   None     Chief Complaint  Patient presents with  . Contractions  . Emesis   Emesis     Nicole [name] is a 23 y.o. G3P2002 at 8091w3d who presents today with nausea and vomiting, and contractions. She states that she has not been able to keep anything down all day. Of note, she is eating mac-and-cheese in her room. She denies any VB or LOF, but does report contractions q6-7 mins. She states that the baby has been moving normally. She denies any complications with this pregnancy. She had pre-eclampsia with her first pregnancy. She has had one c-section, and one vaginal delivery, and would like to have a VBAC this time. She denies any headache, blurry vision or epigastric pain at this time.   Past Medical History  Diagnosis Date  . No pertinent past medical history   . Medical history non-contributory   . Pregnancy induced hypertension 2011    Past Surgical History  Procedure Laterality Date  . Wisdom tooth extraction  2010  . Cesarean section  10/14/2011    Procedure: CESAREAN SECTION;  Surgeon: Kathreen CosierBernard A Marshall, MD;  Location: [facility];  Service: Gynecology;  Laterality: N/A;  Primary Cesarean Section Delivery Baby Boy @ 0001, Apgars 8/9    No family history on file.  History  Substance Use Topics  . Smoking status: Never Smoker   . Smokeless tobacco: Never Used  . Alcohol Use: No    Allergies: No Known Allergies  Prescriptions prior to admission  Medication Sig Dispense Refill  . cyclobenzaprine (FLEXERIL) 10 MG tablet Take 1 tablet (10 mg total) by mouth 2 (two) times daily as needed for muscle spasms.  20 tablet  0  . Prenatal Vit-Fe Fumarate-FA (PRENATAL MULTIVITAMIN) TABS tablet Take 1 tablet by mouth daily at 12 noon.        Review of Systems  Gastrointestinal: Positive for vomiting.   Physical Exam   Blood pressure 143/92, pulse 78, temperature 98.6 F (37 C), temperature source Oral, resp.  rate 18, height 5\' 4"  (1.626 m), weight 100.245 kg (221 lb), last menstrual period 04/08/2013.  Physical Exam  Nursing note and vitals reviewed. Constitutional: She is oriented to person, place, and time. She appears well-developed and well-nourished. No distress.  Cardiovascular: Normal rate.   Respiratory: Effort normal.  GI: Soft. There is no tenderness.  Genitourinary:  SVE by RN 1/thick  Neurological: She is alert and oriented to person, place, and time.  Skin: Skin is warm and dry.  Psychiatric: She has a normal mood and affect.    MAU Course  Procedures  Results for orders placed during the hospital encounter of 01/01/14 (from the past 24 hour(s))  URINALYSIS, ROUTINE W REFLEX MICROSCOPIC     Status: None   Collection Time    01/01/14 10:30 PM      Result Value Ref Range   Color, Urine YELLOW  YELLOW   APPearance CLEAR  CLEAR   Specific Gravity, Urine 1.025  1.005 - 1.030   pH 6.0  5.0 - 8.0   Glucose, UA NEGATIVE  NEGATIVE mg/dL   Hgb urine dipstick NEGATIVE  NEGATIVE   Bilirubin Urine NEGATIVE  NEGATIVE   Ketones, ur NEGATIVE  NEGATIVE mg/dL   Protein, ur NEGATIVE  NEGATIVE mg/dL   Urobilinogen, UA 0.2  0.0 - 1.0 mg/dL   Nitrite NEGATIVE  NEGATIVE   Leukocytes, UA NEGATIVE  NEGATIVE  PROTEIN / CREATININE RATIO, URINE     Status: Abnormal   Collection Time    01/01/14 10:30 PM      Result Value Ref Range   Creatinine, Urine 101.01     Total Protein, Urine 18.8     PROTEIN CREATININE RATIO 0.19 (*) 0.00 - 0.15  CBC     Status: Abnormal   Collection Time    01/01/14 11:15 PM      Result Value Ref Range   WBC 8.4  4.0 - 10.5 K/uL   RBC 3.53 (*) 3.87 - 5.11 MIL/uL   Hemoglobin 8.2 (*) 12.0 - 15.0 g/dL   HCT 57.825.9 (*) 46.936.0 - 62.946.0 %   MCV 73.4 (*) 78.0 - 100.0 fL   MCH 23.2 (*) 26.0 - 34.0 pg   MCHC 31.7  30.0 - 36.0 g/dL   RDW 52.816.2 (*) 41.311.5 - 24.415.5 %   Platelets 258  150 - 400 K/uL  COMPREHENSIVE METABOLIC PANEL     Status: Abnormal   Collection Time     01/01/14 11:15 PM      Result Value Ref Range   Sodium 136 (*) 137 - 147 mEq/L   Potassium 3.6 (*) 3.7 - 5.3 mEq/L   Chloride 102  96 - 112 mEq/L   CO2 22  19 - 32 mEq/L   Glucose, Bld 84  70 - 99 mg/dL   BUN 7  6 - 23 mg/dL   Creatinine, Ser 0.100.40 (*) 0.50 - 1.10 mg/dL   Calcium 8.4  8.4 - 27.210.5 mg/dL   Total Protein 6.1  6.0 - 8.3 g/dL   Albumin 2.4 (*) 3.5 - 5.2 g/dL   AST 18  0 - 37 U/L   ALT 12  0 - 35 U/L   Alkaline Phosphatase 141 (*) 39 - 117 U/L   Total Bilirubin <0.2 (*) 0.3 - 1.2 mg/dL   GFR calc non Af Amer >90  >90 mL/min   GFR calc Af Amer >90  >90 mL/min  LACTATE DEHYDROGENASE     Status: None   Collection Time    01/01/14 11:15 PM      Result Value Ref Range   LDH 182  94 - 250 U/L  URIC ACID     Status: None   Collection Time    01/01/14 11:15 PM      Result Value Ref Range   Uric Acid, Serum 4.1  2.4 - 7.0 mg/dL   [id number]: Patients nausea has improved with phenergan. D/W Dr. Clearance CootsHarper and reviewed labs, and FHR tracing. Plan for dc home at this time, and have her FU with Dr. Gaynell FaceMarshall in the morning.   Assessment and Plan   1. Gestational hypertension w/o significant proteinuria in 3rd trimester    Pre-eclampsia danger signs reviewed Return to MAU as needed Call Dr. Elsie StainMarshall's office in the morning for FU.   Tawnya CrookHeather Donovan Hogan 01/02/2014, 12:21 AM

## 2014-01-03 ENCOUNTER — Inpatient Hospital Stay (HOSPITAL_COMMUNITY)
Admission: AD | Admit: 2014-01-03 | Discharge: 2014-01-06 | DRG: 775 | Disposition: A | Discharge status: Home / Self Care | Payer: Medicaid Other | Source: Ambulatory Visit | Attending: Obstetrics | Admitting: Obstetrics

## 2014-01-03 ENCOUNTER — Encounter (HOSPITAL_COMMUNITY): Payer: Self-pay | Admitting: *Deleted

## 2014-01-03 DIAGNOSIS — D649 Anemia, unspecified: Secondary | ICD-10-CM | POA: Diagnosis present

## 2014-01-03 DIAGNOSIS — E669 Obesity, unspecified: Secondary | ICD-10-CM | POA: Diagnosis present

## 2014-01-03 DIAGNOSIS — O34219 Maternal care for unspecified type scar from previous cesarean delivery: Principal | ICD-10-CM | POA: Diagnosis present

## 2014-01-03 DIAGNOSIS — O99214 Obesity complicating childbirth: Secondary | ICD-10-CM

## 2014-01-03 DIAGNOSIS — O9902 Anemia complicating childbirth: Secondary | ICD-10-CM | POA: Diagnosis present

## 2014-01-03 DIAGNOSIS — Z6837 Body mass index (BMI) 37.0-37.9, adult: Secondary | ICD-10-CM

## 2014-01-03 DIAGNOSIS — IMO0001 Reserved for inherently not codable concepts without codable children: Secondary | ICD-10-CM

## 2014-01-03 NOTE — MAU Note (Signed)
Pt states she  Started having contractions Sunday and they became worse and closer today

## 2014-01-03 NOTE — MAU Note (Signed)
Pt having contractions all day. Denies leaking fluid or bleeding.

## 2014-01-03 NOTE — Progress Notes (Signed)
Headache pt rates 10

## 2014-01-03 NOTE — Progress Notes (Signed)
Pt states she has been having soft stool

## 2014-01-04 ENCOUNTER — Encounter (HOSPITAL_COMMUNITY): Payer: Self-pay

## 2014-01-04 ENCOUNTER — Encounter (HOSPITAL_COMMUNITY): Payer: Medicaid Other | Admitting: Anesthesiology

## 2014-01-04 ENCOUNTER — Inpatient Hospital Stay (HOSPITAL_COMMUNITY): Payer: Medicaid Other | Admitting: Anesthesiology

## 2014-01-04 DIAGNOSIS — IMO0001 Reserved for inherently not codable concepts without codable children: Secondary | ICD-10-CM

## 2014-01-04 LAB — URINALYSIS, ROUTINE W REFLEX MICROSCOPIC
BILIRUBIN URINE: NEGATIVE
Glucose, UA: NEGATIVE mg/dL
Hgb urine dipstick: NEGATIVE
KETONES UR: NEGATIVE mg/dL
LEUKOCYTES UA: NEGATIVE
NITRITE: NEGATIVE
PROTEIN: NEGATIVE mg/dL
Specific Gravity, Urine: <1.005 — ABNORMAL LOW (ref 1.005–1.030)
UROBILINOGEN UA: 0.2 mg/dL (ref 0.0–1.0)
pH: 5.5 (ref 5.0–8.0)

## 2014-01-04 LAB — CBC
HCT: 27.4 % — ABNORMAL LOW (ref 36.0–46.0)
HEMOGLOBIN: 8.6 g/dL — AB (ref 12.0–15.0)
MCH: 23.1 pg — ABNORMAL LOW (ref 26.0–34.0)
MCHC: 31.4 g/dL (ref 30.0–36.0)
MCV: 73.5 fL — ABNORMAL LOW (ref 78.0–100.0)
Platelets: 270 10*3/uL (ref 150–400)
RBC: 3.73 MIL/uL — ABNORMAL LOW (ref 3.87–5.11)
RDW: 16.5 % — AB (ref 11.5–15.5)
WBC: 8.2 10*3/uL (ref 4.0–10.5)

## 2014-01-04 LAB — RPR

## 2014-01-04 LAB — TYPE AND SCREEN
ABO/RH(D): A POS
ANTIBODY SCREEN: NEGATIVE

## 2014-01-04 MED ORDER — DIBUCAINE 1 % RE OINT: 1.0000 "application " | TOPICAL_OINTMENT | RECTAL | Status: DC | PRN

## 2014-01-04 MED ORDER — WITCH HAZEL-GLYCERIN EX PADS: 1.0000 "application " | MEDICATED_PAD | CUTANEOUS | Status: DC | PRN

## 2014-01-04 MED ORDER — CITRIC ACID-SODIUM CITRATE 334-500 MG/5ML PO SOLN: 30.0000 mL | ORAL | Status: DC | PRN

## 2014-01-04 MED ORDER — LACTATED RINGERS IV SOLN
INTRAVENOUS | Status: DC
  Administered 2014-01-04: 01:00:00 via INTRAVENOUS

## 2014-01-04 MED ORDER — ONDANSETRON HCL 4 MG PO TABS
4.0000 mg | ORAL_TABLET | ORAL | Status: DC | PRN
Start: 2014-01-04 — End: 2014-01-06

## 2014-01-04 MED ORDER — LACTATED RINGERS IV SOLN: 500.0000 mL | Freq: Once | INTRAVENOUS | Status: DC

## 2014-01-04 MED ORDER — FLEET ENEMA 7-19 GM/118ML RE ENEM: 1.0000 | ENEMA | RECTAL | Status: DC | PRN

## 2014-01-04 MED ORDER — ZOLPIDEM TARTRATE 5 MG PO TABS: 5.0000 mg | ORAL_TABLET | Freq: Every evening | ORAL | Status: DC | PRN

## 2014-01-04 MED ORDER — FENTANYL 2.5 MCG/ML BUPIVACAINE 1/10 % EPIDURAL INFUSION (WH - ANES)
14.0000 mL/h | INTRAMUSCULAR | Status: DC | PRN
  Administered 2014-01-04: 14 mL/h via EPIDURAL
  Filled 2014-01-04: qty 125

## 2014-01-04 MED ORDER — SENNOSIDES-DOCUSATE SODIUM 8.6-50 MG PO TABS
2.0000 | ORAL_TABLET | ORAL | Status: DC
  Administered 2014-01-05 (×2): 2 via ORAL
  Filled 2014-01-04 (×2): qty 2

## 2014-01-04 MED ORDER — LANOLIN HYDROUS EX OINT
TOPICAL_OINTMENT | CUTANEOUS | Status: DC | PRN
Start: 2014-01-04 — End: 2014-01-06

## 2014-01-04 MED ORDER — TETANUS-DIPHTH-ACELL PERTUSSIS 5-2.5-18.5 LF-MCG/0.5 IM SUSP: 0.5000 mL | Freq: Once | INTRAMUSCULAR | Status: DC

## 2014-01-04 MED ORDER — SIMETHICONE 80 MG PO CHEW: 80.0000 mg | CHEWABLE_TABLET | ORAL | Status: DC | PRN

## 2014-01-04 MED ORDER — EPHEDRINE 5 MG/ML INJ
10.0000 mg | INTRAVENOUS | Status: DC | PRN
  Filled 2014-01-04: qty 4
  Filled 2014-01-04: qty 2

## 2014-01-04 MED ORDER — IBUPROFEN 600 MG PO TABS: 600.0000 mg | ORAL_TABLET | Freq: Four times a day (QID) | ORAL | Status: DC | PRN

## 2014-01-04 MED ORDER — ONDANSETRON HCL 4 MG/2ML IJ SOLN: 4.0000 mg | INTRAMUSCULAR | Status: DC | PRN

## 2014-01-04 MED ORDER — ACETAMINOPHEN 325 MG PO TABS: 650.0000 mg | ORAL_TABLET | ORAL | Status: DC | PRN

## 2014-01-04 MED ORDER — BUTORPHANOL TARTRATE 1 MG/ML IJ SOLN
1.0000 mg | INTRAMUSCULAR | Status: DC | PRN
  Administered 2014-01-04 (×2): 1 mg via INTRAVENOUS
  Filled 2014-01-04 (×2): qty 1

## 2014-01-04 MED ORDER — PHENYLEPHRINE 40 MCG/ML (10ML) SYRINGE FOR IV PUSH (FOR BLOOD PRESSURE SUPPORT)
80.0000 ug | PREFILLED_SYRINGE | INTRAVENOUS | Status: DC | PRN
  Filled 2014-01-04: qty 2
  Filled 2014-01-04: qty 10

## 2014-01-04 MED ORDER — OXYCODONE-ACETAMINOPHEN 5-325 MG PO TABS: 1.0000 | ORAL_TABLET | ORAL | Status: DC | PRN

## 2014-01-04 MED ORDER — OXYTOCIN BOLUS FROM INFUSION: 500.0000 mL | INTRAVENOUS | Status: DC

## 2014-01-04 MED ORDER — OXYCODONE-ACETAMINOPHEN 5-325 MG PO TABS
1.0000 | ORAL_TABLET | ORAL | Status: DC | PRN
Start: 2014-01-04 — End: 2014-01-06
  Administered 2014-01-04 – 2014-01-06 (×8): 1 via ORAL
  Filled 2014-01-04 (×8): qty 1

## 2014-01-04 MED ORDER — LACTATED RINGERS IV SOLN: 500.0000 mL | INTRAVENOUS | Status: DC | PRN

## 2014-01-04 MED ORDER — DIPHENHYDRAMINE HCL 50 MG/ML IJ SOLN: 12.5000 mg | INTRAMUSCULAR | Status: DC | PRN

## 2014-01-04 MED ORDER — PRENATAL MULTIVITAMIN CH
1.0000 | ORAL_TABLET | Freq: Every day | ORAL | Status: DC
  Administered 2014-01-04 – 2014-01-05 (×2): 1 via ORAL
  Filled 2014-01-04 (×2): qty 1

## 2014-01-04 MED ORDER — LIDOCAINE HCL (PF) 1 % IJ SOLN
INTRAMUSCULAR | Status: DC | PRN
  Administered 2014-01-04 (×4): 4 mL

## 2014-01-04 MED ORDER — ONDANSETRON HCL 4 MG/2ML IJ SOLN
4.0000 mg | Freq: Four times a day (QID) | INTRAMUSCULAR | Status: DC | PRN
  Administered 2014-01-04: 4 mg via INTRAVENOUS
  Filled 2014-01-04: qty 2

## 2014-01-04 MED ORDER — EPHEDRINE 5 MG/ML INJ
10.0000 mg | INTRAVENOUS | Status: DC | PRN
  Filled 2014-01-04: qty 2

## 2014-01-04 MED ORDER — BENZOCAINE-MENTHOL 20-0.5 % EX AERO
1.0000 "application " | INHALATION_SPRAY | CUTANEOUS | Status: DC | PRN
  Administered 2014-01-05: 1 via TOPICAL
  Filled 2014-01-04: qty 56

## 2014-01-04 MED ORDER — DIPHENHYDRAMINE HCL 25 MG PO CAPS: 25.0000 mg | ORAL_CAPSULE | Freq: Four times a day (QID) | ORAL | Status: DC | PRN

## 2014-01-04 MED ORDER — FERROUS SULFATE 325 (65 FE) MG PO TABS
325.0000 mg | ORAL_TABLET | Freq: Two times a day (BID) | ORAL | Status: DC
  Administered 2014-01-04 – 2014-01-06 (×4): 325 mg via ORAL
  Filled 2014-01-04 (×4): qty 1

## 2014-01-04 MED ORDER — PHENYLEPHRINE 40 MCG/ML (10ML) SYRINGE FOR IV PUSH (FOR BLOOD PRESSURE SUPPORT)
80.0000 ug | PREFILLED_SYRINGE | INTRAVENOUS | Status: DC | PRN
  Filled 2014-01-04: qty 2

## 2014-01-04 MED ORDER — IBUPROFEN 600 MG PO TABS
600.0000 mg | ORAL_TABLET | Freq: Four times a day (QID) | ORAL | Status: DC
  Administered 2014-01-04 – 2014-01-06 (×8): 600 mg via ORAL
  Filled 2014-01-04 (×8): qty 1

## 2014-01-04 MED ORDER — OXYTOCIN 40 UNITS IN LACTATED RINGERS INFUSION - SIMPLE MED
62.5000 mL/h | INTRAVENOUS | Status: DC
  Filled 2014-01-04: qty 1000

## 2014-01-04 MED ORDER — LIDOCAINE HCL (PF) 1 % IJ SOLN
30.0000 mL | INTRAMUSCULAR | Status: DC | PRN
  Filled 2014-01-04 (×2): qty 30

## 2014-01-04 NOTE — Progress Notes (Signed)
Dr Gaynell FaceMarshall informed of elevated bp

## 2014-01-04 NOTE — Lactation Note (Signed)
This note was copied from the chart of Girl Lora PaulaKashia Barbeau. Lactation Consultation Note  Patient Name: Girl Lora PaulaKashia Mesquita [id number]'s Date: 01/04/2014 Reason for consult: Initial assessment of this multipara who is an experienced breastfeeding mom.  She states she knows how to hand express colostrum/milk and breastfed her youngest child for 8 months   At this time, her older child (a daughter) is sitting beside mom and getting to know her new sibling.  LC encouraged STS and cue feedings at breast.  LC encouraged review of Baby and Me pp 9, 14 and 20-25 for STS and BF information. LC provided [employer] Resource brochure and reviewed [facility] services and list of community and web site resources.    Maternal Data Formula Feeding for Exclusion: Yes Reason for exclusion: Mother's choice to formula and breast feed on admission Infant to breast within first hour of birth: Yes (initial LATCH score =  10) Has patient been taught Hand Expression?: Yes (mom states she knows how to hand express her milk) Does the patient have breastfeeding experience prior to this delivery?: Yes  Feeding    LATCH Score/Interventions           initial LATCH score=10, after delivery           Lactation Tools Discussed/Used   STS, cue feedings, hand expression  Consult Status Consult Status: Follow-up Date: 01/05/14 Follow-up type: In-patient    Zara ChessJoanne P Nilay Mangrum 01/04/2014, 10:04 PM

## 2014-01-04 NOTE — Anesthesia Preprocedure Evaluation (Addendum)
Anesthesia Evaluation  Patient identified by MRN, date of birth, ID band Patient awake    Reviewed: Allergy & Precautions, H&P , NPO status , Patient's Chart, lab work & pertinent test results, reviewed documented beta blocker date and time   History of Anesthesia Complications Negative for: history of anesthetic complications  Airway Mallampati: II TM Distance: >3 FB Neck ROM: full    Dental  (+) Teeth Intact   Pulmonary neg pulmonary ROS,  breath sounds clear to auscultation        Cardiovascular hypertension (prior pregnancy), negative cardio ROS  Rhythm:regular Rate:Normal     Neuro/Psych negative neurological ROS  negative psych ROS   GI/Hepatic negative GI ROS, Neg liver ROS,   Endo/Other  Obese - BMI 37.8  Renal/GU negative Renal ROS     Musculoskeletal   Abdominal   Peds  Hematology  (+) anemia , hgb 8.6   Anesthesia Other Findings Tongue piercing - asked to remove  Reproductive/Obstetrics (+) Pregnancy (h/o C/S x1, attempting VBAC)                          Anesthesia Physical Anesthesia Plan  ASA: II  Anesthesia Plan: Epidural   Post-op Pain Management:    Induction:   Airway Management Planned:   Additional Equipment:   Intra-op Plan:   Post-operative Plan:   Informed Consent: I have reviewed the patients History and Physical, chart, labs and discussed the procedure including the risks, benefits and alternatives for the proposed anesthesia with the patient or authorized representative who has indicated his/her understanding and acceptance.     Plan Discussed with:   Anesthesia Plan Comments:         Anesthesia Quick Evaluation

## 2014-01-04 NOTE — Anesthesia Procedure Notes (Signed)
Epidural Patient location during procedure: OB Start time: 01/04/2014 5:31 AM  Staffing Performed by: anesthesiologist   Preanesthetic Checklist Completed: patient identified, site marked, surgical consent, pre-op evaluation, timeout performed, IV checked, risks and benefits discussed and monitors and equipment checked  Epidural Patient position: sitting Prep: site prepped and draped and DuraPrep Patient monitoring: continuous pulse ox and blood pressure Approach: midline Injection technique: LOR air  Needle:  Needle type: Tuohy  Needle gauge: 17 G Needle length: 9 cm and 9 Needle insertion depth: 6.5 cm Catheter type: closed end flexible Catheter size: 19 Gauge Catheter at skin depth: 11.5 cm Test dose: negative  Assessment Events: blood not aspirated, injection not painful, no injection resistance, negative IV test and no paresthesia  Additional Notes Discussed risk of headache, infection, bleeding, nerve injury and failed or incomplete block.  Patient voices understanding and wishes to proceed.  Epidural placed on second attempt after repositioning/coaching patient on position.  No paresthesia.  Patient tolerated procedure well with no apparent complications.  Nicole DecemberA. Ashleigh Arya MDReason for block:procedure for pain

## 2014-01-04 NOTE — H&P (Signed)
This is Dr. Francoise CeoBernard Tessie Ordaz dictating the history and physical on blank blank she is a 23 year old gravida 3 para 2002 at 38 weeks and 5 days admitted in labor negative GBS cervix is now 9 cm membranes ruptured spontaneously and the fluids clear she is at a 0 station Past medical history negative Past surgical history she had a C-section in the past Social history negative System review negative Physical exam well-developed female in labor HEENT negative Lungs clear to P&A Heart regular rhythm no murmurs no gallops Breasts negative Abdomen term Pelvic as described above Extremities negative

## 2014-01-04 NOTE — Progress Notes (Signed)
Dr, Gaynell FaceMarshall informed of pt elevated bp.

## 2014-01-04 NOTE — Progress Notes (Signed)
UR chart review completed.  

## 2014-01-04 NOTE — Progress Notes (Signed)
Upon initial assessment, bleeding moderate and fundus deviated to the right and three above.  Firmed up with massage; got patient to void and rechecked fundus.  Fundus firmer, midline and 2 above; bleeding small amount now.  Will continue to monitor.  Vivi MartensAshley Maeson Purohit RN

## 2014-01-04 NOTE — Anesthesia Postprocedure Evaluation (Signed)
Anesthesia Post Note  Patient: Nicole [name]  Procedure(s) Performed: * No procedures listed *  Anesthesia type: Epidural  Patient location: Mother/Baby  Post pain: Pain level controlled  Post assessment: Post-op Vital signs reviewed  Last Vitals:  Filed Vitals:   01/04/14 1609  BP: 127/79  Pulse: 76  Temp: 36.9 C  Resp: 18    Post vital signs: Reviewed  Level of consciousness:alert  Complications: No apparent anesthesia complications

## 2014-01-05 LAB — CBC
HCT: 26.9 % — ABNORMAL LOW (ref 36.0–46.0)
Hemoglobin: 8.2 g/dL — ABNORMAL LOW (ref 12.0–15.0)
MCH: 22.6 pg — AB (ref 26.0–34.0)
MCHC: 30.5 g/dL (ref 30.0–36.0)
MCV: 74.1 fL — ABNORMAL LOW (ref 78.0–100.0)
PLATELETS: 255 10*3/uL (ref 150–400)
RBC: 3.63 MIL/uL — AB (ref 3.87–5.11)
RDW: 16.3 % — ABNORMAL HIGH (ref 11.5–15.5)
WBC: 9.8 10*3/uL (ref 4.0–10.5)

## 2014-01-05 NOTE — Progress Notes (Signed)
Patient ID: Nicole PaulaKashia [name], female   DOB: [birth year], 23 y.o.   MRN: [id number] Postpartum day one Vital signs normal Fundus firm Lochia moderate Legs negative doing well and

## 2014-01-05 NOTE — Lactation Note (Addendum)
This note was copied from the chart of Girl Lora PaulaKashia Caris. Lactation Consultation Note  Patient Name: Girl Lora PaulaKashia Borchers ZOXWR'UToday's Date: 01/05/2014 Reason for consult: Follow-up assessment  Follow-up assessment, baby 28 hours of life. Mom reports breastfeeding going well. LC assessed mom latch baby. Baby latches deeply, baby sustains rhythmic sucking with swallows/gulps. Mom reports that baby has been nursing well since just after birth. Mom is an experienced breastfeeding mom. Will be returning to school in 6 weeks. Enc to wait at least 3 weeks before offering a bottle of EBM. [facility] Mom given a hand pump with instructions. Enc mom to call out for assistance as needed.  Maternal Data    Feeding Feeding Type: Breast Fed Length of feed: 10 min  LATCH Score/Interventions Latch: Grasps breast easily, tongue down, lips flanged, rhythmical sucking.  Audible Swallowing: Spontaneous and intermittent  Type of Nipple: Everted at rest and after stimulation  Comfort (Breast/Nipple): Soft / non-tender     Hold (Positioning): No assistance needed to correctly position infant at breast.  LATCH Score: 10  Lactation Tools Discussed/Used     Consult Status Consult Status: Follow-up Follow-up type: In-patient    Sherlyn HayJennifer D Neesa Knapik 01/05/2014, 12:24 PM

## 2014-01-06 NOTE — Discharge Summary (Signed)
Obstetric Discharge Summary Reason for Admission: onset of labor Prenatal Procedures: none Intrapartum Procedures: spontaneous vaginal delivery Postpartum Procedures: none Complications-Operative and Postpartum: none Hemoglobin  Date Value Ref Range Status  01/05/2014 8.2* 12.0 - 15.0 g/dL Final     HCT  Date Value Ref Range Status  01/05/2014 26.9* 36.0 - 46.0 % Final    Physical Exam:  General: alert Lochia: appropriate Uterine Fundus: firm Incision: healing well DVT Evaluation: No evidence of DVT seen on physical exam.  Discharge Diagnoses: Term Pregnancy-delivered  Discharge Information: Date: 01/06/2014 Activity: pelvic rest Diet: routine Medications: Percocet Condition: stable Instructions: refer to practice specific booklet Discharge to: home Follow-up Information   Follow up with Nicole CosierMARSHALL,Solan Vosler A, MD.   Specialty:  Obstetrics and Gynecology   Contact information:   [address] [place] [id number] [phone]       Newborn Data: Live born female  Birth Weight: 7 lb 2.8 oz (3255 g) APGAR: 9, 9  Home with mother.  Nicole [name] 01/06/2014, 7:16 AM

## 2014-01-06 NOTE — Discharge Instructions (Signed)
Discharge instructions   You can wash your hair  Shower  Eat what you want  Drink what you want  See me in 6 weeks  Your ankles are going to swell more in the next 2 weeks than when pregnant  No sex for 6 weeks   Kathreen CosierBernard A Marti Mclane, MD 01/06/2014

## 2014-01-06 NOTE — Lactation Note (Addendum)
This note was copied from the chart of [name]. Lactation Consultation Note  Patient Name: [name] JYNWG'NToday's Date: 01/06/2014 Reason for consult: Follow-up assessment Follow-up assessment prior to discharge, baby 49 hours of life. Mom reports baby nursing well, baby has just nursed 30 minutes ago and is sleeping next to mom. Mom states she is hearing swallows when baby nurses and breasts are softer after nursing. Reviewed engorgement prevention/treatment. Mom has a hand pump. Referred mom to Baby and Me booklet for EBM storage parameters and number of diapers to expect for baby. Mom aware of OP/BFSG and community services, and enc to [facility] number for assistance as needed.  Maternal Data    Feeding Feeding Type:  (Mom states baby just nursed 30 min.s ago, baby sleeping.)  LATCH Score/Interventions                      Lactation Tools Discussed/Used     Consult Status Consult Status: Complete    Sherlyn HayJennifer D Denali Sharma 01/06/2014, 9:33 AM

## 2014-03-27 ENCOUNTER — Encounter (HOSPITAL_COMMUNITY): Payer: Self-pay | Admitting: Emergency Medicine

## 2014-03-27 ENCOUNTER — Emergency Department (HOSPITAL_COMMUNITY)
Admission: EM | Admit: 2014-03-27 | Discharge: 2014-03-27 | Disposition: A | Discharge status: Home / Self Care | Payer: Medicaid Other | Attending: Emergency Medicine | Admitting: Emergency Medicine

## 2014-03-27 DIAGNOSIS — L03319 Cellulitis of trunk, unspecified: Secondary | ICD-10-CM | POA: Diagnosis not present

## 2014-03-27 DIAGNOSIS — L0291 Cutaneous abscess, unspecified: Secondary | ICD-10-CM

## 2014-03-27 DIAGNOSIS — R51 Headache: Secondary | ICD-10-CM | POA: Diagnosis not present

## 2014-03-27 DIAGNOSIS — L02219 Cutaneous abscess of trunk, unspecified: Secondary | ICD-10-CM | POA: Insufficient documentation

## 2014-03-27 MED ORDER — OXYCODONE-ACETAMINOPHEN 5-325 MG PO TABS: 1.0000 | ORAL_TABLET | Freq: Four times a day (QID) | ORAL | Status: DC | PRN

## 2014-03-27 MED ORDER — OXYCODONE-ACETAMINOPHEN 5-325 MG PO TABS
1.0000 | ORAL_TABLET | Freq: Once | ORAL | Status: AC
  Administered 2014-03-27: 1 via ORAL
  Filled 2014-03-27: qty 1

## 2014-03-27 MED ORDER — CEPHALEXIN 500 MG PO CAPS: ORAL_CAPSULE | ORAL | Status: DC

## 2014-03-27 MED ORDER — SULFAMETHOXAZOLE-TMP DS 800-160 MG PO TABS: 1.0000 | ORAL_TABLET | Freq: Two times a day (BID) | ORAL | Status: DC

## 2014-03-27 NOTE — ED Provider Notes (Signed)
CSN: [id number]     Arrival date & time 03/27/14  1659 History  This chart was scribed for non-physician practitioner, Allen Derry, PA-C working with Dagmar Hait, MD by Luisa Dago, ED scribe. This patient was seen in room TR07C/TR07C and the patient's care was started at 6:39 PM.    Chief Complaint  Patient presents with  . Abscess   Patient is a 23 y.o. female presenting with abscess. The history is provided by the patient. No language interpreter was used.  Abscess Location:  Torso Torso abscess location:  L chest Size:  Dime Abscess quality: induration, painful and redness   Abscess quality: not draining and no warmth   Red streaking: no   Duration:  4 days Progression:  Worsening Pain details:    Quality:  Aching and pressure   Duration:  4 days   Timing:  Constant   Progression:  Unchanged Chronicity:  New Context: insect bite/sting   Context: not diabetes and not injected drug use   Relieved by:  Nothing Worsened by:  Nothing tried Ineffective treatments:  Warm compresses Associated symptoms: headaches   Associated symptoms: no fatigue, no fever, no nausea and no vomiting    HPI Comments: Nicole [name] is a 23 y.o. female with no PMHx who presents to the Emergency Department complaining of an abscess to her left side chest wall that she first noticed approximately 4 days ago. Pt states that she thinks that she was bit by a spider. She report applying a warm towel to the area to minimize the pain. Nicole [name] rates her current pain as a 6/10, constant, aching, and nonradiating. Pt denies any medicinal allergies, DM, or taking any blood thinners. Denies any red streaking, fever, chills, nausea, emesis, numbness, SOB, diaphoresis, abdominal pain, or chest pain. Denies breastfeeding at the moment.  Past Medical History  Diagnosis Date  . No pertinent past medical history   . Medical history non-contributory   . Pregnancy induced hypertension 2011   Past  Surgical History  Procedure Laterality Date  . Wisdom tooth extraction  2010  . Cesarean section  10/14/2011    Procedure: CESAREAN SECTION;  Surgeon: Kathreen Cosier, MD;  Location: [facility];  Service: Gynecology;  Laterality: N/A;  Primary Cesarean Section Delivery Baby Boy @ 0001, Apgars 8/9   Family History  Problem Relation Age of Onset  . Hypertension Mother   . Hypertension Father    History  Substance Use Topics  . Smoking status: Never Smoker   . Smokeless tobacco: Never Used  . Alcohol Use: No   OB History   Grav Para Term Preterm Abortions TAB SAB Ect Mult Living   3 3 3  0 0 0 0 0 0 3     Review of Systems  Constitutional: Negative for fever, chills and fatigue.  HENT: Negative for congestion.   Respiratory: Negative for cough and shortness of breath.   Gastrointestinal: Negative for nausea, vomiting and abdominal pain.  Skin: Positive for rash (abscess).  Neurological: Positive for headaches.   Allergies  Review of patient's allergies indicates no known allergies.  Home Medications   Prior to Admission medications   Medication Sig Start Date End Date Taking? Authorizing Provider  cephALEXin (KEFLEX) 500 MG capsule 2 caps po bid x 7 days 03/27/14   Tarry Blayney Strupp Camprubi-Soms, PA-C  oxyCODONE-acetaminophen (PERCOCET) 5-325 MG per tablet Take 1-2 tablets by mouth every 6 (six) hours as needed for severe pain. 03/27/14   Jenea Dake Strupp Camprubi-Soms, PA-C  sulfamethoxazole-trimethoprim (BACTRIM DS) 800-160 MG per tablet Take 1 tablet by mouth 2 (two) times daily. 03/27/14   Keimora Swartout Strupp Camprubi-Soms, PA-C   BP 105/95  Pulse 81  Temp(Src) 98.3 F (36.8 C)  Resp 18  SpO2 100%  Physical Exam  Nursing note and vitals reviewed. Constitutional: She is oriented to person, place, and time. Vital signs are normal. She appears well-developed and well-nourished. No distress.  afebrile  HENT:  Head: Normocephalic and atraumatic.  Mouth/Throat: Mucous membranes  are normal.  Eyes: Conjunctivae and EOM are normal.  Neck: Normal range of motion. Neck supple.  Cardiovascular: Normal rate.   Pulmonary/Chest: Effort normal. No respiratory distress.  Abdominal: Normal appearance. She exhibits no distension.  Musculoskeletal: Normal range of motion.  Neurological: She is alert and oriented to person, place, and time.  Skin: Skin is warm and dry. There is erythema.  4-5 cm area over the left rib cage mid axillary line, with erythema, induration, and central fluctuance. Tender to palpation over indurated area, but no tenderness to remaining chest wall. No red streaking. Small area in the center approximately 1 cm diameter with a scale, but no active drainage or puncture wound  Psychiatric: She has a normal mood and affect. Her behavior is normal.    ED Course  INCISION AND DRAINAGE Date/Time: 03/28/2014 7:00 PM Performed by: [name], Khiyan Crace STRUPP Authorized by: Ramond Marrow Consent: Verbal consent obtained. Risks and benefits: risks, benefits and alternatives were discussed Consent given by: patient Patient understanding: patient states understanding of the procedure being performed Patient consent: the patient's understanding of the procedure matches consent given Patient identity confirmed: verbally with patient Type: abscess Body area: trunk Anesthesia: local infiltration Local anesthetic: lidocaine 2% without epinephrine Anesthetic total: 1 ml Patient sedated: no Scalpel size: 11 Needle gauge: 22 Incision type: single straight Complexity: simple Drainage: purulent Drainage amount: scant Packing material: 1/4 in iodoform gauze Patient tolerance: Patient tolerated the procedure well with no immediate complications.   (including critical care time)  DIAGNOSTIC STUDIES: Oxygen Saturation is 100% on RA, normal by my interpretation.    COORDINATION OF CARE: 6:47 PM- Advised pt that if she any redness or swelling  around the area she should return to the ED Pt advised of plan for treatment and pt agrees.    Labs Review Labs Reviewed  WOUND CULTURE    Imaging Review No results found.   EKG Interpretation None      MDM   Final diagnoses:  Abscess    Nicole [name] is a 23 y.o. female presenting with an abscess to her left chest wall, that began 4 days ago. Patient afebrile and nontoxic-appearing. No history of diabetes. Area of 4-5 cm in diameter, with fluctuance in the center. I&D was performed which produced purulent discharge, which was cultured. Patient given pain medication while in the ER. Packed with quarter inch iodoform gauze. I will discharge her home with pain medication, and antibiotics. Told her followup with her primary care doctor in 2 days for packing change removal.  Patient will continue warm compresses to the area. The patient given strict return precautions including red streaking, cellulitis to the area, fevers or chills. I explained the diagnosis and have given explicit precautions to return to the ER including for any other new or worsening symptoms. The patient understands and accepts the medical plan as it's been dictated and I have answered their questions. Discharge instructions concerning home care and prescriptions have been given. The patient is STABLE and  is discharged to home in good condition.  I personally performed the services described in this documentation, which was scribed in my presence. The recorded information has been reviewed and is accurate.  BP 158/102  Pulse 71  Temp(Src) 98.2 F (36.8 C) (Oral)  Resp 16  SpO2 100%    Celanese CorporationMercedes Strupp Camprubi-Soms, PA-C 03/28/14 [id number]

## 2014-03-27 NOTE — Discharge Instructions (Signed)
Keep wound clean and dry. Apply warm compresses to affected area throughout the day. Take antibiotic until it is finished. Take Percocet as directed, as needed for pain but do not drive or operate machinery with pain medication use. Do not breastfeed while taking this medication. Followup with [facility]/Primary Care doctor in 2 days for wRedge Gaineround recheck and packing removal.  Return to emergency department for emergent changing or worsening symptoms.   Abscess Care After An abscess (also called a boil or furuncle) is an infected area that contains a collection of pus. Signs and symptoms of an abscess include pain, tenderness, redness, or hardness, or you may feel a moveable soft area under your skin. An abscess can occur anywhere in the body. The infection may spread to surrounding tissues causing cellulitis. A cut (incision) by the surgeon was made over your abscess and the pus was drained out. Gauze may have been packed into the space to provide a drain that will allow the cavity to heal from the inside outwards. The boil may be painful for 5 to 7 days. Most people with a boil do not have high fevers. Your abscess, if seen early, may not have localized, and may not have been lanced. If not, another appointment may be required for this if it does not get better on its own or with medications. HOME CARE INSTRUCTIONS   Only take over-the-counter or prescription medicines for pain, discomfort, or fever as directed by your caregiver.  When you bathe, soak and then remove gauze or iodoform packs at least daily or as directed by your caregiver. You may then wash the wound gently with mild soapy water. Repack with gauze or do as your caregiver directs. SEEK IMMEDIATE MEDICAL CARE IF:   You develop increased pain, swelling, redness, drainage, or bleeding in the wound site.  You develop signs of generalized infection including muscle aches, chills, fever, or a general ill feeling.  An oral  temperature above 102 F (38.9 C) develops, not controlled by medication. See your caregiver for a recheck if you develop any of the symptoms described above. If medications (antibiotics) were prescribed, take them as directed. Document Released: 03/13/2005 Document Revised: 11/17/2011 Document Reviewed: 11/08/2007 [facility] Patient Information 2015 [place], [place]. This information is not intended to replace advice given to you by your health care provider. Make sure you discuss any questions you have with your health care provider.

## 2014-03-27 NOTE — ED Notes (Signed)
Per pt abscess to left upper side. Area hard and red. sts she was bit by a spider.

## 2014-03-28 NOTE — ED Provider Notes (Signed)
Medical screening examination/treatment/procedure(s) were performed by non-physician practitioner and as supervising physician I was immediately available for consultation/collaboration.   EKG Interpretation None        William Laryah Neuser, MD 03/28/14 2305 

## 2014-03-30 LAB — WOUND CULTURE

## 2014-03-31 ENCOUNTER — Emergency Department (HOSPITAL_COMMUNITY)
Admission: EM | Admit: 2014-03-31 | Discharge: 2014-03-31 | Disposition: A | Discharge status: Home / Self Care | Payer: Medicaid Other | Source: Home / Self Care | Attending: Family Medicine | Admitting: Family Medicine

## 2014-03-31 ENCOUNTER — Encounter (HOSPITAL_COMMUNITY): Payer: Self-pay | Admitting: Emergency Medicine

## 2014-03-31 DIAGNOSIS — L0390 Cellulitis, unspecified: Secondary | ICD-10-CM

## 2014-03-31 DIAGNOSIS — L0291 Cutaneous abscess, unspecified: Secondary | ICD-10-CM

## 2014-03-31 NOTE — ED Notes (Signed)
Here for abscess check

## 2014-03-31 NOTE — ED Provider Notes (Signed)
Nicole [name] is a 23 y.o. female who presents to Urgent Care today for left-sided abscess. Patient was seen in the emergency room on July 20 for left-sided trunk abscess. The abscess was incised and drained and packed. She was treated with Bactrim and Keflex. She notes the pain is much improved. She is here today for followup.   Past Medical History  Diagnosis Date  . No pertinent past medical history   . Medical history non-contributory   . Pregnancy induced hypertension 2011   History  Substance Use Topics  . Smoking status: Never Smoker   . Smokeless tobacco: Never Used  . Alcohol Use: No   ROS as above Medications: No current facility-administered medications for this encounter.   Current Outpatient Prescriptions  Medication Sig Dispense Refill  . cephALEXin (KEFLEX) 500 MG capsule 2 caps po bid x 7 days  28 capsule  0  . oxyCODONE-acetaminophen (PERCOCET) 5-325 MG per tablet Take 1-2 tablets by mouth every 6 (six) hours as needed for severe pain.  10 tablet  0  . sulfamethoxazole-trimethoprim (BACTRIM DS) 800-160 MG per tablet Take 1 tablet by mouth 2 (two) times daily.  14 tablet  0    Exam:  BP 172/82  Pulse 60  Temp(Src) 98.6 F (37 C) (Oral)  Resp 18  SpO2 100% Gen: Well NAD Skin: Left side trunk abscess. Well-appearing skin with minimal induration and erythema. Mildly tender. Packing removed. Small amount of pus is expressed. Blunt probe revealed no loculation. The wound was dressed again  No results found for this or any previous visit (from the past 24 hour(s)). No results found.  Assessment and Plan: 23 y.o. female with abscess followup. Doing well. Continue antibiotics. Followup as needed  Discussed warning signs or symptoms. Please see discharge instructions. Patient expresses understanding.   This note was created using Conservation officer, historic buildingsDragon voice recognition software. Any transcription errors are unintended.    Rodolph BongEvan S Jamyia Fortune, MD 03/31/14 [phone]

## 2014-03-31 NOTE — Discharge Instructions (Signed)
Thank you for coming in today. Continue the antibiotics. Come back as needed.    Abscess An abscess is an infected area that contains a collection of pus and debris.It can occur in almost any part of the body. An abscess is also known as a furuncle or boil. CAUSES  An abscess occurs when tissue gets infected. This can occur from blockage of oil or sweat glands, infection of hair follicles, or a minor injury to the skin. As the body tries to fight the infection, pus collects in the area and creates pressure under the skin. This pressure causes pain. People with weakened immune systems have difficulty fighting infections and get certain abscesses more often.  SYMPTOMS Usually an abscess develops on the skin and becomes a painful mass that is red, warm, and tender. If the abscess forms under the skin, you may feel a moveable soft area under the skin. Some abscesses break open (rupture) on their own, but most will continue to get worse without care. The infection can spread deeper into the body and eventually into the bloodstream, causing you to feel ill.  DIAGNOSIS  Your caregiver will take your medical history and perform a physical exam. A sample of fluid may also be taken from the abscess to determine what is causing your infection. TREATMENT  Your caregiver may prescribe antibiotic medicines to fight the infection. However, taking antibiotics alone usually does not cure an abscess. Your caregiver may need to make a small cut (incision) in the abscess to drain the pus. In some cases, gauze is packed into the abscess to reduce pain and to continue draining the area. HOME CARE INSTRUCTIONS   Only take over-the-counter or prescription medicines for pain, discomfort, or fever as directed by your caregiver.  If you were prescribed antibiotics, take them as directed. Finish them even if you start to feel better.  If gauze is used, follow your caregiver's directions for changing the gauze.  To avoid  spreading the infection:  Keep your draining abscess covered with a bandage.  Wash your hands well.  Do not share personal care items, towels, or whirlpools with others.  Avoid skin contact with others.  Keep your skin and clothes clean around the abscess.  Keep all follow-up appointments as directed by your caregiver. SEEK MEDICAL CARE IF:   You have increased pain, swelling, redness, fluid drainage, or bleeding.  You have muscle aches, chills, or a general ill feeling.  You have a fever. MAKE SURE YOU:   Understand these instructions.  Will watch your condition.  Will get help right away if you are not doing well or get worse. Document Released: 06/04/2005 Document Revised: 02/24/2012 Document Reviewed: 11/07/2011 [facility] Patient Information 2015 [place], [place]. This information is not intended to replace advice given to you by your health care provider. Make sure you discuss any questions you have with your health care provider.

## 2014-04-01 ENCOUNTER — Telehealth (HOSPITAL_BASED_OUTPATIENT_CLINIC_OR_DEPARTMENT_OTHER): Payer: Self-pay

## 2014-04-01 NOTE — Telephone Encounter (Signed)
Post ED Visit - Positive Culture Follow-up  Culture report reviewed by antimicrobial stewardship pharmacist: []  Wes Dulaney, Pharm.D., BCPS []  Celedonio MiyamotoJeremy Frens, Pharm.D., BCPS []  Georgina PillionElizabeth Martin, Pharm.D., BCPS [x]  HarrisburgMinh Pham, [address] Rainbow BoulevardPharm.D., BCPS, AAHIVP []  Estella HuskMichelle Turner, Pharm.D., BCPS, AAHIVP []    Positive Wound culture, Abundant Staph Aureus Treated with Keflex and Sulfa/Trimeth, organism sensitive to the same and no further patient follow-up is required at this time.  Arvid RightClark, Saphronia Ozdemir Dorn 04/01/2014, 3:23 AM

## 2014-06-26 ENCOUNTER — Encounter (HOSPITAL_COMMUNITY): Payer: Self-pay | Admitting: Emergency Medicine

## 2014-06-26 ENCOUNTER — Emergency Department (HOSPITAL_COMMUNITY)
Admission: EM | Admit: 2014-06-26 | Discharge: 2014-06-26 | Disposition: A | Discharge status: Home / Self Care | Payer: Medicaid Other | Attending: Emergency Medicine | Admitting: Emergency Medicine

## 2014-06-26 DIAGNOSIS — J3489 Other specified disorders of nose and nasal sinuses: Secondary | ICD-10-CM | POA: Insufficient documentation

## 2014-06-26 DIAGNOSIS — R011 Cardiac murmur, unspecified: Secondary | ICD-10-CM | POA: Diagnosis not present

## 2014-06-26 DIAGNOSIS — R000 Tachycardia, unspecified: Secondary | ICD-10-CM | POA: Insufficient documentation

## 2014-06-26 DIAGNOSIS — H65192 Other acute nonsuppurative otitis media, left ear: Secondary | ICD-10-CM | POA: Diagnosis not present

## 2014-06-26 DIAGNOSIS — H9202 Otalgia, left ear: Secondary | ICD-10-CM | POA: Diagnosis present

## 2014-06-26 DIAGNOSIS — Z792 Long term (current) use of antibiotics: Secondary | ICD-10-CM | POA: Diagnosis not present

## 2014-06-26 DIAGNOSIS — J029 Acute pharyngitis, unspecified: Secondary | ICD-10-CM | POA: Diagnosis not present

## 2014-06-26 DIAGNOSIS — H65112 Acute and subacute allergic otitis media (mucoid) (sanguinous) (serous), left ear: Secondary | ICD-10-CM

## 2014-06-26 MED ORDER — AMOXICILLIN 500 MG PO CAPS
500.0000 mg | ORAL_CAPSULE | Freq: Three times a day (TID) | ORAL | Status: DC
Start: 2014-06-26 — End: 2015-12-08

## 2014-06-26 MED ORDER — GUAIFENESIN 100 MG/5ML PO LIQD: 100.0000 mg | ORAL | Status: DC | PRN

## 2014-06-26 NOTE — ED Provider Notes (Signed)
CSN: [id number]     Arrival date & time 06/26/14  [id number] History   First MD Initiated Contact with Patient 06/26/14 [phone]     No chief complaint on file.    (Consider location/radiation/quality/duration/timing/severity/associated sxs/prior Treatment) HPI  23 year old female with no significant past medical history, a nonsmoker, presents complaining of URI symptoms.  Patient reports for the past week she has had left ear pain, ear congestion, nasal congestion, occasional sneeze and cough.  Now report sore throat, chills, decreased in appetite and mild discomfort when laying on left side. No associated fever, no productive cough, no hemoptysis, no shortness of breath, no rash. Aside from using steam, no other specific treatment tried.  Past Medical History  Diagnosis Date  . No pertinent past medical history   . Medical history non-contributory   . Pregnancy induced hypertension 2011   Past Surgical History  Procedure Laterality Date  . Wisdom tooth extraction  2010  . Cesarean section  10/14/2011    Procedure: CESAREAN SECTION;  Surgeon: Kathreen CosierBernard A Marshall, MD;  Location: [facility];  Service: Gynecology;  Laterality: N/A;  Primary Cesarean Section Delivery Baby Boy @ 0001, Apgars 8/9   Family History  Problem Relation Age of Onset  . Hypertension Mother   . Hypertension Father    History  Substance Use Topics  . Smoking status: Never Smoker   . Smokeless tobacco: Never Used  . Alcohol Use: No   OB History   Grav Para Term Preterm Abortions TAB SAB Ect Mult Living   3 3 3  0 0 0 0 0 0 3     Review of Systems  Constitutional: Negative for fever.  HENT: Positive for ear discharge, ear pain, mouth sores, rhinorrhea, sneezing and sore throat. Negative for hearing loss and trouble swallowing.   Respiratory: Negative for shortness of breath.   Skin: Negative for rash.  Neurological: Positive for headaches.      Allergies  Review of patient's allergies indicates no known  allergies.  Home Medications   Prior to Admission medications   Medication Sig Start Date End Date Taking? Authorizing Provider  cephALEXin (KEFLEX) 500 MG capsule 2 caps po bid x 7 days 03/27/14   Mercedes Strupp Camprubi-Soms, PA-C  oxyCODONE-acetaminophen (PERCOCET) 5-325 MG per tablet Take 1-2 tablets by mouth every 6 (six) hours as needed for severe pain. 03/27/14   Mercedes Strupp Camprubi-Soms, PA-C  sulfamethoxazole-trimethoprim (BACTRIM DS) 800-160 MG per tablet Take 1 tablet by mouth 2 (two) times daily. 03/27/14   Mercedes Strupp Camprubi-Soms, PA-C   There were no vitals taken for this visit. Physical Exam  Nursing note and vitals reviewed. Constitutional: She appears well-developed and well-nourished. No distress.  HENT:  Head: Atraumatic.  Ears: R ear with normal TM, L ear with erythematous and dull TM, non perforated, normal ear canal with minimal lobe tenderness.  Normal hearing.  Nose: normal  THroat: uvula midline, no tonsillar enlargement or exudates, no trismus  Eyes: Conjunctivae are normal.  Neck: Normal range of motion. Neck supple.  No nuchal rigidity  Cardiovascular: Normal rate and regular rhythm.   Murmur heard. Pulmonary/Chest: Effort normal. No respiratory distress. She has no wheezes. She exhibits no tenderness.  Abdominal: Soft.  Lymphadenopathy:    She has no cervical adenopathy.  Neurological: She is alert.  Skin: No rash noted.  Psychiatric: She has a normal mood and affect.    ED Course  Procedures (including critical care time)  8:42 AM Patient presents with URI symptoms, however her  left ear appears infected. This is likely viral related, I will prescribe amoxicillin for suspected otitis media. No other significant finding. Recommend followup with PCP as needed.  Labs Review Labs Reviewed - No data to display  Imaging Review No results found.   EKG Interpretation None      MDM   Final diagnoses:  Acute mucoid otitis media of  left ear    BP 141/89  Pulse 75  Temp(Src) 97.9 F (36.6 C) (Oral)  SpO2 100%  LMP 05/27/2014     Fayrene HelperBowie Sahej Schrieber, PA-C 06/26/14 [phone]

## 2014-06-26 NOTE — Discharge Instructions (Signed)

## 2014-06-26 NOTE — ED Notes (Signed)
Started one week ago with nasal congestion. Has gotten progressively worse with sore throat, cough, chills, decreased appetite, left ear pain.

## 2014-06-27 NOTE — ED Provider Notes (Signed)
Medical screening examination/treatment/procedure(s) were performed by non-physician practitioner and as supervising physician I was immediately available for consultation/collaboration.   EKG Interpretation None        Jaccob Czaplicki, DO 06/27/14 2316 

## 2014-07-10 ENCOUNTER — Encounter (HOSPITAL_COMMUNITY): Payer: Self-pay | Admitting: Emergency Medicine

## 2015-06-04 ENCOUNTER — Encounter (HOSPITAL_COMMUNITY): Payer: Self-pay | Admitting: Cardiology

## 2015-06-04 ENCOUNTER — Emergency Department (HOSPITAL_COMMUNITY)
Admission: EM | Admit: 2015-06-04 | Discharge: 2015-06-04 | Disposition: A | Discharge status: Home / Self Care | Payer: Medicaid Other | Attending: Emergency Medicine | Admitting: Emergency Medicine

## 2015-06-04 DIAGNOSIS — R51 Headache: Secondary | ICD-10-CM | POA: Diagnosis not present

## 2015-06-04 DIAGNOSIS — R519 Headache, unspecified: Secondary | ICD-10-CM

## 2015-06-04 DIAGNOSIS — M549 Dorsalgia, unspecified: Secondary | ICD-10-CM | POA: Diagnosis not present

## 2015-06-04 DIAGNOSIS — Z792 Long term (current) use of antibiotics: Secondary | ICD-10-CM | POA: Insufficient documentation

## 2015-06-04 DIAGNOSIS — Z3202 Encounter for pregnancy test, result negative: Secondary | ICD-10-CM | POA: Diagnosis not present

## 2015-06-04 LAB — POC URINE PREG, ED: Preg Test, Ur: NEGATIVE

## 2015-06-04 MED ORDER — METOCLOPRAMIDE HCL 5 MG/ML IJ SOLN
10.0000 mg | Freq: Once | INTRAMUSCULAR | Status: AC
  Administered 2015-06-04: 10 mg via INTRAVENOUS
  Filled 2015-06-04: qty 2

## 2015-06-04 MED ORDER — KETOROLAC TROMETHAMINE 30 MG/ML IJ SOLN
30.0000 mg | Freq: Once | INTRAMUSCULAR | Status: AC
  Administered 2015-06-04: 30 mg via INTRAVENOUS
  Filled 2015-06-04: qty 1

## 2015-06-04 MED ORDER — DEXAMETHASONE SODIUM PHOSPHATE 10 MG/ML IJ SOLN
10.0000 mg | Freq: Once | INTRAMUSCULAR | Status: AC
  Administered 2015-06-04: 10 mg via INTRAVENOUS
  Filled 2015-06-04: qty 1

## 2015-06-04 NOTE — ED Provider Notes (Signed)
CSN: [id number]     Arrival date & time 06/04/15  1722 History   First MD Initiated Contact with Patient 06/04/15 1824     Chief Complaint  Patient presents with  . Headache     (Consider location/radiation/quality/duration/timing/severity/associated sxs/prior Treatment) Patient is a 24 y.o. female presenting with headaches. The history is provided by the patient. No language interpreter was used.  Headache Associated symptoms: no dizziness, no fever, no neck pain and no numbness   Nicole [name] is a 24 y.o female who presents for intermittent headaches for the past month but states that this headache has been ongoing for several days now without relief from ibuprofen, and Tylenol. She states her headaches are normally relieved with over the counter medications. She describes the pain as throbbing across the back of her head. She denies being evaluated by a neurologist for this in the past. She also states there is a chance that she could be pregnant. Her last menstrual period was sometime last month. She denies pain on birth control. She denies any fever, chills, photophobia, phonophobia, vision changes, fever, neck pain, vomiting.   Past Medical History  Diagnosis Date  . No pertinent past medical history   . Medical history non-contributory   . Pregnancy induced hypertension 2011   Past Surgical History  Procedure Laterality Date  . Wisdom tooth extraction  2010  . Cesarean section  10/14/2011    Procedure: CESAREAN SECTION;  Surgeon: Kathreen Cosier, MD;  Location: [facility];  Service: Gynecology;  Laterality: N/A;  Primary Cesarean Section Delivery Baby Boy @ 0001, Apgars 8/9   Family History  Problem Relation Age of Onset  . Hypertension Mother   . Hypertension Father    Social History  Substance Use Topics  . Smoking status: Never Smoker   . Smokeless tobacco: Never Used  . Alcohol Use: No   OB History    Gravida Para Term Preterm AB TAB SAB Ectopic Multiple Living   0  0 0 0 0 0 3     Review of Systems  Constitutional: Negative for fever and chills.  Musculoskeletal: Negative for neck pain.  Neurological: Positive for headaches. Negative for dizziness, syncope, facial asymmetry, speech difficulty and numbness.  All other systems reviewed and are negative.     Allergies  Review of patient's allergies indicates no known allergies.  Home Medications   Prior to Admission medications   Medication Sig Start Date End Date Taking? Authorizing Provider  amoxicillin (AMOXIL) 500 MG capsule Take 1 capsule (500 mg total) by mouth 3 (three) times daily. 06/26/14   Fayrene Helper, PA-C  cephALEXin (KEFLEX) 500 MG capsule 2 caps po bid x 7 days 03/27/14   Mercedes Camprubi-Soms, PA-C  guaiFENesin (ROBITUSSIN) 100 MG/5ML liquid Take 5-10 mLs (100-200 mg total) by mouth every 4 (four) hours as needed for cough. 06/26/14   Fayrene Helper, PA-C  oxyCODONE-acetaminophen (PERCOCET) 5-325 MG per tablet Take 1-2 tablets by mouth every 6 (six) hours as needed for severe pain. 03/27/14   Mercedes Camprubi-Soms, PA-C  sulfamethoxazole-trimethoprim (BACTRIM DS) 800-160 MG per tablet Take 1 tablet by mouth 2 (two) times daily. 03/27/14   Mercedes Camprubi-Soms, PA-C   BP 122/85 mmHg  Pulse 63  Temp(Src) 98.3 F (36.8 C) (Oral)  Resp 16  Ht  (1.626 m)  Wt 150 lb 8 oz (68.266 kg)  BMI 25.82 kg/m2  SpO2 100%  LMP 04/30/2015 Physical Exam  Constitutional: She is oriented to person, place,  and time. She appears well-developed and well-nourished.  HENT:  Head: Normocephalic and atraumatic.  Eyes: Conjunctivae are normal.  Neck: Normal range of motion. Neck supple. No spinous process tenderness and no muscular tenderness present. No rigidity. Normal range of motion present. No Brudzinski's sign and no Kernig's sign noted.  Neck is supple with full range of motion.  Cardiovascular: Normal rate, regular rhythm and normal heart sounds.   Pulmonary/Chest: Effort normal and breath  sounds normal.  Abdominal: Soft.  Musculoskeletal: Normal range of motion.  Neurological: She is alert and oriented to person, place, and time. She has normal strength. No cranial nerve deficit or sensory deficit. She displays a negative Romberg sign. GCS eye subscore is 4. GCS verbal subscore is 5. GCS motor subscore is 6.  Cranial nerves III through XII intact. Ambulatory with steady gait. Normal heel to chin exam bilaterally. Negative romberg.   Skin: Skin is warm and dry.  Psychiatric: She has a normal mood and affect.  Nursing note and vitals reviewed.   ED Course  Procedures (including critical care time) Labs Review Labs Reviewed  POC URINE PREG, ED    Imaging Review No results found.    EKG Interpretation None      MDM   Final diagnoses:  Acute nonintractable headache, unspecified headache type  Patient presents for headache and nausea and concerned that she may be pregnant. She is well-appearing and in no acute distress. He has no concerning signs for SAH, meningitis, or temporal arteritis. Pregnancy test is negative. Medications  metoCLOPramide (REGLAN) injection 10 mg (10 mg Intravenous Given 06/04/15 1912)  ketorolac (TORADOL) 30 MG/ML injection 30 mg (30 mg Intravenous Given 06/04/15 1912)  dexamethasone (DECADRON) injection 10 mg (10 mg Intravenous Given 06/04/15 1912)   Filed Vitals:   06/04/15 1900  BP: 122/85  Pulse: 63  Temp:   Resp: 16  Recheck:  She states she is feeling much better. I discussed return precautions with the patient. I also discussed following up with neurology or her primary care physician. She verbally agrees with the plan.    Catha Gosselin, PA-C 06/04/15 1954  Benjiman Core, MD 06/04/15 [phone]

## 2015-06-04 NOTE — ED Notes (Signed)
Pt reports a headache for the past week. States she has tried OTC medication at home without much relief.

## 2015-06-04 NOTE — Discharge Instructions (Signed)
Headaches, Frequently Asked Questions Follow-up with neurology or your primary care physician. Return for fever, neck pain, increased headaches. MIGRAINE HEADACHES Q: What is migraine? What causes it? How can I treat it? A: Generally, migraine headaches begin as a dull ache. Then they develop into a constant, throbbing, and pulsating pain. You may experience pain at the temples. You may experience pain at the front or back of one or both sides of the head. The pain is usually accompanied by a combination of:  Nausea.  Vomiting.  Sensitivity to light and noise. Some people (about 15%) experience an aura (see below) before an attack. The cause of migraine is believed to be chemical reactions in the brain. Treatment for migraine may include over-the-counter or prescription medications. It may also include self-help techniques. These include relaxation training and biofeedback.  Q: What is an aura? A: About 15% of people with migraine get an "aura". This is a sign of neurological symptoms that occur before a migraine headache. You may see wavy or jagged lines, dots, or flashing lights. You might experience tunnel vision or blind spots in one or both eyes. The aura can include visual or auditory hallucinations (something imagined). It may include disruptions in smell (such as strange odors), taste or touch. Other symptoms include:  Numbness.  A "pins and needles" sensation.  Difficulty in recalling or speaking the correct word. These neurological events may last as long as 60 minutes. These symptoms will fade as the headache begins. Q: What is a trigger? A: Certain physical or environmental factors can lead to or "trigger" a migraine. These include:  Foods.  Hormonal changes.  Weather.  Stress. It is important to remember that triggers are different for everyone. To help prevent migraine attacks, you need to figure out which triggers affect you. Keep a headache diary. This is a good way to  track triggers. The diary will help you talk to your healthcare professional about your condition. Q: Does weather affect migraines? A: Bright sunshine, hot, humid conditions, and drastic changes in barometric pressure may lead to, or "trigger," a migraine attack in some people. But studies have shown that weather does not act as a trigger for everyone with migraines. Q: What is the link between migraine and hormones? A: Hormones start and regulate many of your body's functions. Hormones keep your body in balance within a constantly changing environment. The levels of hormones in your body are unbalanced at times. Examples are during menstruation, pregnancy, or menopause. That can lead to a migraine attack. In fact, about three quarters of all women with migraine report that their attacks are related to the menstrual cycle.  Q: Is there an increased risk of stroke for migraine sufferers? A: The likelihood of a migraine attack causing a stroke is very remote. That is not to say that migraine sufferers cannot have a stroke associated with their migraines. In persons under age 46, the most common associated factor for stroke is migraine headache. But over the course of a person's normal life span, the occurrence of migraine headache may actually be associated with a reduced risk of dying from cerebrovascular disease due to stroke.  Q: What are acute medications for migraine? A: Acute medications are used to treat the pain of the headache after it has started. Examples over-the-counter medications, NSAIDs, ergots, and triptans.  Q: What are the triptans? A: Triptans are the newest class of abortive medications. They are specifically targeted to treat migraine. Triptans are vasoconstrictors. They moderate some  chemical reactions in the brain. The triptans work on receptors in your brain. Triptans help to restore the balance of a neurotransmitter called serotonin. Fluctuations in levels of serotonin are thought  to be a main cause of migraine.  Q: Are over-the-counter medications for migraine effective? A: Over-the-counter, or "OTC," medications may be effective in relieving mild to moderate pain and associated symptoms of migraine. But you should see your caregiver before beginning any treatment regimen for migraine.  Q: What are preventive medications for migraine? A: Preventive medications for migraine are sometimes referred to as "prophylactic" treatments. They are used to reduce the frequency, severity, and length of migraine attacks. Examples of preventive medications include antiepileptic medications, antidepressants, beta-blockers, calcium channel blockers, and NSAIDs (nonsteroidal anti-inflammatory drugs). Q: Why are anticonvulsants used to treat migraine? A: During the past few years, there has been an increased interest in antiepileptic drugs for the prevention of migraine. They are sometimes referred to as "anticonvulsants". Both epilepsy and migraine may be caused by similar reactions in the brain.  Q: Why are antidepressants used to treat migraine? A: Antidepressants are typically used to treat people with depression. They may reduce migraine frequency by regulating chemical levels, such as serotonin, in the brain.  Q: What alternative therapies are used to treat migraine? A: The term "alternative therapies" is often used to describe treatments considered outside the scope of conventional Western medicine. Examples of alternative therapy include acupuncture, acupressure, and yoga. Another common alternative treatment is herbal therapy. Some herbs are believed to relieve headache pain. Always discuss alternative therapies with your caregiver before proceeding. Some herbal products contain arsenic and other toxins. TENSION HEADACHES Q: What is a tension-type headache? What causes it? How can I treat it? A: Tension-type headaches occur randomly. They are often the result of temporary stress, anxiety,  fatigue, or anger. Symptoms include soreness in your temples, a tightening band-like sensation around your head (a "vice-like" ache). Symptoms can also include a pulling feeling, pressure sensations, and contracting head and neck muscles. The headache begins in your forehead, temples, or the back of your head and neck. Treatment for tension-type headache may include over-the-counter or prescription medications. Treatment may also include self-help techniques such as relaxation training and biofeedback. CLUSTER HEADACHES Q: What is a cluster headache? What causes it? How can I treat it? A: Cluster headache gets its name because the attacks come in groups. The pain arrives with little, if any, warning. It is usually on one side of the head. A tearing or bloodshot eye and a runny nose on the same side of the headache may also accompany the pain. Cluster headaches are believed to be caused by chemical reactions in the brain. They have been described as the most severe and intense of any headache type. Treatment for cluster headache includes prescription medication and oxygen. SINUS HEADACHES Q: What is a sinus headache? What causes it? How can I treat it? A: When a cavity in the bones of the face and skull (a sinus) becomes inflamed, the inflammation will cause localized pain. This condition is usually the result of an allergic reaction, a tumor, or an infection. If your headache is caused by a sinus blockage, such as an infection, you will probably have a fever. An x-ray will confirm a sinus blockage. Your caregiver's treatment might include antibiotics for the infection, as well as antihistamines or decongestants.  REBOUND HEADACHES Q: What is a rebound headache? What causes it? How can I treat it? A: A pattern of  taking acute headache medications too often can lead to a condition known as "rebound headache." A pattern of taking too much headache medication includes taking it more than 2 days per week or in  excessive amounts. That means more than the label or a caregiver advises. With rebound headaches, your medications not only stop relieving pain, they actually begin to cause headaches. Doctors treat rebound headache by tapering the medication that is being overused. Sometimes your caregiver will gradually substitute a different type of treatment or medication. Stopping may be a challenge. Regularly overusing a medication increases the potential for serious side effects. Consult a caregiver if you regularly use headache medications more than 2 days per week or more than the label advises. ADDITIONAL QUESTIONS AND ANSWERS Q: What is biofeedback? A: Biofeedback is a self-help treatment. Biofeedback uses special equipment to monitor your body's involuntary physical responses. Biofeedback monitors:  Breathing.  Pulse.  Heart rate.  Temperature.  Muscle tension.  Brain activity. Biofeedback helps you refine and perfect your relaxation exercises. You learn to control the physical responses that are related to stress. Once the technique has been mastered, you do not need the equipment any more. Q: Are headaches hereditary? A: Four out of five (80%) of people that suffer report a family history of migraine. Scientists are not sure if this is genetic or a family predisposition. Despite the uncertainty, a child has a 50% chance of having migraine if one parent suffers. The child has a 75% chance if both parents suffer.  Q: Can children get headaches? A: By the time they reach high school, most young people have experienced some type of headache. Many safe and effective approaches or medications can prevent a headache from occurring or stop it after it has begun.  Q: What type of doctor should I see to diagnose and treat my headache? A: Start with your primary caregiver. Discuss his or her experience and approach to headaches. Discuss methods of classification, diagnosis, and treatment. Your caregiver may  decide to recommend you to a headache specialist, depending upon your symptoms or other physical conditions. Having diabetes, allergies, etc., may require a more comprehensive and inclusive approach to your headache. The National Head[employer] Foundation will provide, upon request, a list of [facility] physician members in your state. Document Released: 11/15/2003 Document Revised: 11/17/2011 Document Reviewed: 04/24/2008 [facility] Patient Information 2015 [place], [place]. This information is not intended to replace advice given to you by your health care provider. Make sure you discuss any questions you have with your health care provider.

## 2015-06-28 ENCOUNTER — Other Ambulatory Visit (HOSPITAL_COMMUNITY): Payer: Self-pay | Admitting: Obstetrics

## 2015-06-28 DIAGNOSIS — Z3491 Encounter for supervision of normal pregnancy, unspecified, first trimester: Secondary | ICD-10-CM

## 2015-06-29 ENCOUNTER — Ambulatory Visit (HOSPITAL_COMMUNITY)
Admission: RE | Admit: 2015-06-29 | Discharge: 2015-06-29 | Disposition: A | Discharge status: Home / Self Care | Payer: Medicaid Other | Source: Ambulatory Visit | Attending: Obstetrics | Admitting: Obstetrics

## 2015-06-29 DIAGNOSIS — Z3201 Encounter for pregnancy test, result positive: Secondary | ICD-10-CM | POA: Diagnosis not present

## 2015-06-29 DIAGNOSIS — N854 Malposition of uterus: Secondary | ICD-10-CM | POA: Insufficient documentation

## 2015-06-29 DIAGNOSIS — Z3491 Encounter for supervision of normal pregnancy, unspecified, first trimester: Secondary | ICD-10-CM

## 2015-12-08 ENCOUNTER — Encounter (HOSPITAL_COMMUNITY): Payer: Self-pay

## 2015-12-08 ENCOUNTER — Emergency Department (HOSPITAL_COMMUNITY): Payer: Medicaid Other

## 2015-12-08 ENCOUNTER — Emergency Department (HOSPITAL_COMMUNITY)
Admission: EM | Admit: 2015-12-08 | Discharge: 2015-12-08 | Disposition: A | Discharge status: Home / Self Care | Payer: Medicaid Other | Attending: Emergency Medicine | Admitting: Emergency Medicine

## 2015-12-08 DIAGNOSIS — R197 Diarrhea, unspecified: Secondary | ICD-10-CM | POA: Insufficient documentation

## 2015-12-08 DIAGNOSIS — K5900 Constipation, unspecified: Secondary | ICD-10-CM | POA: Diagnosis not present

## 2015-12-08 DIAGNOSIS — R109 Unspecified abdominal pain: Secondary | ICD-10-CM

## 2015-12-08 DIAGNOSIS — R112 Nausea with vomiting, unspecified: Secondary | ICD-10-CM | POA: Diagnosis not present

## 2015-12-08 DIAGNOSIS — Z3202 Encounter for pregnancy test, result negative: Secondary | ICD-10-CM | POA: Insufficient documentation

## 2015-12-08 DIAGNOSIS — R1032 Left lower quadrant pain: Secondary | ICD-10-CM | POA: Diagnosis present

## 2015-12-08 LAB — CBC WITH DIFFERENTIAL/PLATELET
BASOS ABS: 0 10*3/uL (ref 0.0–0.1)
BASOS PCT: 1 %
EOS ABS: 0.2 10*3/uL (ref 0.0–0.7)
Eosinophils Relative: 6 %
HCT: 28.6 % — ABNORMAL LOW (ref 36.0–46.0)
HEMOGLOBIN: 9.1 g/dL — AB (ref 12.0–15.0)
Lymphocytes Relative: 38 %
Lymphs Abs: 1.3 10*3/uL (ref 0.7–4.0)
MCH: 23.8 pg — ABNORMAL LOW (ref 26.0–34.0)
MCHC: 31.8 g/dL (ref 30.0–36.0)
MCV: 74.7 fL — ABNORMAL LOW (ref 78.0–100.0)
Monocytes Absolute: 0.3 10*3/uL (ref 0.1–1.0)
Monocytes Relative: 9 %
Neutro Abs: 1.6 10*3/uL — ABNORMAL LOW (ref 1.7–7.7)
Neutrophils Relative %: 48 %
Platelets: 391 10*3/uL (ref 150–400)
RBC: 3.83 MIL/uL — ABNORMAL LOW (ref 3.87–5.11)
RDW: 18.5 % — AB (ref 11.5–15.5)
WBC: 3.5 10*3/uL — ABNORMAL LOW (ref 4.0–10.5)

## 2015-12-08 LAB — COMPREHENSIVE METABOLIC PANEL
ALBUMIN: 3.8 g/dL (ref 3.5–5.0)
ALK PHOS: 48 U/L (ref 38–126)
ALT: 38 U/L (ref 14–54)
ANION GAP: 6 (ref 5–15)
AST: 77 U/L — AB (ref 15–41)
BUN: 8 mg/dL (ref 6–20)
CO2: 27 mmol/L (ref 22–32)
Calcium: 8.6 mg/dL — ABNORMAL LOW (ref 8.9–10.3)
Chloride: 109 mmol/L (ref 101–111)
Creatinine, Ser: 0.52 mg/dL (ref 0.44–1.00)
GFR calc Af Amer: >60 mL/min (ref >60)
GFR calc non Af Amer: >60 mL/min (ref >60)
GLUCOSE: 95 mg/dL (ref 65–99)
POTASSIUM: 4 mmol/L (ref 3.5–5.1)
SODIUM: 142 mmol/L (ref 135–145)
Total Bilirubin: 0.2 mg/dL — ABNORMAL LOW (ref 0.3–1.2)
Total Protein: 7.1 g/dL (ref 6.5–8.1)

## 2015-12-08 LAB — URINALYSIS, ROUTINE W REFLEX MICROSCOPIC
Bilirubin Urine: NEGATIVE
Glucose, UA: NEGATIVE mg/dL
Ketones, ur: NEGATIVE mg/dL
Nitrite: NEGATIVE
PH: 6.5 (ref 5.0–8.0)
Protein, ur: NEGATIVE mg/dL
SPECIFIC GRAVITY, URINE: 1.003 — AB (ref 1.005–1.030)

## 2015-12-08 LAB — URINE MICROSCOPIC-ADD ON

## 2015-12-08 LAB — PREGNANCY, URINE: Preg Test, Ur: NEGATIVE

## 2015-12-08 LAB — LIPASE, BLOOD: Lipase: 30 U/L (ref 11–51)

## 2015-12-08 MED ORDER — MORPHINE SULFATE (PF) 4 MG/ML IV SOLN
4.0000 mg | INTRAVENOUS | Status: DC | PRN
  Administered 2015-12-08: 4 mg via INTRAVENOUS
  Filled 2015-12-08: qty 1

## 2015-12-08 MED ORDER — POLYETHYLENE GLYCOL 3350 17 GM/SCOOP PO POWD: 17.0000 g | Freq: Every day | ORAL | Status: DC

## 2015-12-08 MED ORDER — IOHEXOL 300 MG/ML  SOLN
50.0000 mL | Freq: Once | INTRAMUSCULAR | Status: AC | PRN
  Administered 2015-12-08: 50 mL via ORAL

## 2015-12-08 MED ORDER — ONDANSETRON HCL 4 MG/2ML IJ SOLN
4.0000 mg | Freq: Once | INTRAMUSCULAR | Status: AC
  Administered 2015-12-08: 4 mg via INTRAVENOUS
  Filled 2015-12-08: qty 2

## 2015-12-08 MED ORDER — IOPAMIDOL (ISOVUE-300) INJECTION 61%
100.0000 mL | Freq: Once | INTRAVENOUS | Status: AC | PRN
  Administered 2015-12-08: 100 mL via INTRAVENOUS

## 2015-12-08 NOTE — Discharge Instructions (Signed)
Abdominal Pain, Adult °Many things can cause abdominal pain. Usually, abdominal pain is not caused by a disease and will improve without treatment. It can often be observed and treated at home. Your health care provider will do a physical exam and possibly order blood tests and X-rays to help determine the seriousness of your pain. However, in many cases, more time must pass before a clear cause of the pain can be found. Before that point, your health care provider may not know if you need more testing or further treatment. °HOME CARE INSTRUCTIONS °Monitor your abdominal pain for any changes. The following actions may help to alleviate any discomfort you are experiencing: °· Only take over-the-counter or prescription medicines as directed by your health care provider. °· Do not take laxatives unless directed to do so by your health care provider. °· Try a clear liquid diet (broth, tea, or water) as directed by your health care provider. Slowly move to a bland diet as tolerated. °SEEK MEDICAL CARE IF: °· You have unexplained abdominal pain. °· You have abdominal pain associated with nausea or diarrhea. °· You have pain when you urinate or have a bowel movement. °· You experience abdominal pain that wakes you in the night. °· You have abdominal pain that is worsened or improved by eating food. °· You have abdominal pain that is worsened with eating fatty foods. °· You have a fever. °SEEK IMMEDIATE MEDICAL CARE IF: °· Your pain does not go away within 2 hours. °· You keep throwing up (vomiting). °· Your pain is felt only in portions of the abdomen, such as the right side or the left lower portion of the abdomen. °· You pass bloody or black tarry stools. °MAKE SURE YOU: °· Understand these instructions. °· Will watch your condition. °· Will get help right away if you are not doing well or get worse. °  °This information is not intended to replace advice given to you by your health care provider. Make sure you discuss  any questions you have with your health care provider. °  °Document Released: 06/04/2005 Document Revised: 05/16/2015 Document Reviewed: 05/04/2013 °Elsevier Interactive Patient Education ©2016 Elsevier Inc. ° °Constipation, Adult °Constipation is when a person: °· Poops (has a bowel movement) less than 3 times a week. °· Has a hard time pooping. °· Has poop that is dry, hard, or bigger than normal. °HOME CARE  °· Eat foods with a lot of fiber in them. This includes fruits, vegetables, beans, and whole grains such as brown rice. °· Avoid fatty foods and foods with a lot of sugar. This includes french fries, hamburgers, cookies, candy, and soda. °· If you are not getting enough fiber from food, take products with added fiber in them (supplements). °· Drink enough fluid to keep your pee (urine) clear or pale yellow. °· Exercise on a regular basis, or as told by your doctor. °· Go to the restroom when you feel like you need to poop. Do not hold it. °· Only take medicine as told by your doctor. Do not take medicines that help you poop (laxatives) without talking to your doctor first. °GET HELP RIGHT AWAY IF:  °· You have bright red blood in your poop (stool). °· Your constipation lasts more than 4 days or gets worse. °· You have belly (abdominal) or butt (rectal) pain. °· You have thin poop (as thin as a pencil). °· You lose weight, and it cannot be explained. °MAKE SURE YOU:  °· Understand these instructions. °·   Will watch your condition. °· Will get help right away if you are not doing well or get worse. °  °This information is not intended to replace advice given to you by your health care provider. Make sure you discuss any questions you have with your health care provider. °  °Document Released: 02/11/2008 Document Revised: 09/15/2014 Document Reviewed: 06/06/2013 °Elsevier Interactive Patient Education ©2016 Elsevier Inc. ° °

## 2015-12-08 NOTE — ED Notes (Signed)
Pt complains of abdominal pain for one week, she states that she's had some vomiting and diarrhea

## 2015-12-08 NOTE — ED Provider Notes (Signed)
CSN: [id number]     Arrival date & time 12/08/15  0610 History   First MD Initiated Contact with Patient 12/08/15 [phone]     Chief Complaint  Patient presents with  . Abdominal Pain      HPI  This presents for evaluation of abdominal pain. Since last Sundays pain in her left lower abdomen. Somewhat into her back as well. Denies any urinary symptoms. No fevers or chills. Some pain with movement. Within a day or 2 the onset of her pain developed some nausea and has had a few episodes of vomiting during the week. One episode of diarrhea per day. No blood pus or mucus in her stools. Heme-negative nonbilious emesis. No history previous similar symptoms.  Last period was 2/22. States is not unusual for her to be up to 5 weeks in between episodes. No spotting or discharge. No dyspareunia. No concern regarding STDs.  Past Medical History  Diagnosis Date  . No pertinent past medical history   . Medical history non-contributory   . Pregnancy induced hypertension 2011   Past Surgical History  Procedure Laterality Date  . Wisdom tooth extraction  2010  . Cesarean section  10/14/2011    Procedure: CESAREAN SECTION;  Surgeon: Kathreen Cosier, MD;  Location: [facility];  Service: Gynecology;  Laterality: N/A;  Primary Cesarean Section Delivery Baby Boy @ 0001, Apgars 8/9   Family History  Problem Relation Age of Onset  . Hypertension Mother   . Hypertension Father    Social History  Substance Use Topics  . Smoking status: Never Smoker   . Smokeless tobacco: Never Used  . Alcohol Use: No   OB History    Gravida Para Term Preterm AB TAB SAB Ectopic Multiple Living   0 0 0 0 0 0 3     Review of Systems  Constitutional: Negative for fever, chills, diaphoresis, appetite change and fatigue.  HENT: Negative for mouth sores, sore throat and trouble swallowing.   Eyes: Negative for visual disturbance.  Respiratory: Negative for cough, chest tightness, shortness of breath and wheezing.     Cardiovascular: Negative for chest pain.  Gastrointestinal: Positive for nausea, vomiting, abdominal pain and diarrhea. Negative for abdominal distention.  Endocrine: Negative for polydipsia, polyphagia and polyuria.  Genitourinary: Negative for dysuria, frequency and hematuria.  Musculoskeletal: Negative for gait problem.  Skin: Negative for color change, pallor and rash.  Neurological: Negative for dizziness, syncope, light-headedness and headaches.  Hematological: Does not bruise/bleed easily.  Psychiatric/Behavioral: Negative for behavioral problems and confusion.      Allergies  Review of patient's allergies indicates no known allergies.  Home Medications   Prior to Admission medications   Medication Sig Start Date End Date Taking? Authorizing Provider  ibuprofen (ADVIL,MOTRIN) 200 MG tablet Take 600 mg by mouth every 6 (six) hours as needed for moderate pain.   Yes Historical Provider, MD  polyethylene glycol powder (GLYCOLAX/MIRALAX) powder Take 17 g by mouth daily. 12/08/15   Rolland Porter, MD   BP 151/94 mmHg  Pulse 55  Temp(Src) 97.8 F (36.6 C) (Oral)  Resp 15  Ht  (1.651 m)  Wt 155 lb (70.308 kg)  BMI 25.79 kg/m2  SpO2 100%  LMP 11/28/2015 Physical Exam  Constitutional: She is oriented to person, place, and time. She appears well-developed and well-nourished. No distress.  HENT:  Head: Normocephalic.  Eyes: Conjunctivae are normal. Pupils are equal, round, and reactive to light. No scleral icterus.  Neck: Normal range  of motion. Neck supple. No thyromegaly present.  Cardiovascular: Normal rate and regular rhythm.  Exam reveals no gallop and no friction rub.   No murmur heard. Pulmonary/Chest: Effort normal and breath sounds normal. No respiratory distress. She has no wheezes. She has no rales.  Abdominal: Soft. Bowel sounds are normal. She exhibits no distension. There is no tenderness. There is no rebound.    No pelvic tenderness.  TTP lt flank.  No skin  lesions.  Musculoskeletal: Normal range of motion.  Neurological: She is alert and oriented to person, place, and time.  Skin: Skin is warm and dry. No rash noted.  Psychiatric: She has a normal mood and affect. Her behavior is normal.    ED Course  Procedures (including critical care time) Labs Review Labs Reviewed  CBC WITH DIFFERENTIAL/PLATELET - Abnormal; Notable for the following:    WBC 3.5 (*)    RBC 3.83 (*)    Hemoglobin 9.1 (*)    HCT 28.6 (*)    MCV 74.7 (*)    MCH 23.8 (*)    RDW 18.5 (*)    Neutro Abs 1.6 (*)    All other components within normal limits  COMPREHENSIVE METABOLIC PANEL - Abnormal; Notable for the following:    Calcium 8.6 (*)    AST 77 (*)    Total Bilirubin 0.2 (*)    All other components within normal limits  URINALYSIS, ROUTINE W REFLEX MICROSCOPIC (NOT AT [facility]) - Abnormal; Notable for the following:    Specific Gravity, Urine 1.003 (*)    Hgb urine dipstick TRACE (*)    Leukocytes, UA TRACE (*)    All other components within normal limits  URINE MICROSCOPIC-ADD ON - Abnormal; Notable for the following:    Squamous Epithelial / LPF 6-30 (*)    Bacteria, UA RARE (*)    All other components within normal limits  LIPASE, BLOOD  PREGNANCY, URINE    Imaging Review Ct Abdomen Pelvis W Contrast  12/08/2015  CLINICAL DATA:  Lower abdominal pain for 1 week. EXAM: CT ABDOMEN AND PELVIS WITH CONTRAST TECHNIQUE: Multidetector CT imaging of the abdomen and pelvis was performed using the standard protocol following bolus administration of intravenous contrast. CONTRAST:  50mL OMNIPAQUE IOHEXOL 300 MG/ML SOLN, ISOVUE-300 IOPAMIDOL (ISOVUE-300) INJECTION 61% COMPARISON:  None. FINDINGS: Minimal dependent atelectasis Liver, gallbladder, spleen, pancreas, adrenal glands are within normal limits. Simple cyst in the upper pole of the right kidney is lobulated likely due to minimal intra-abdominal fat and crowding of the organs. Left kidney is unremarkable.  Normal appendix. Bladder and adnexa are within normal limits.  Uterus is retroverted. Stool, gas, and contrast is present throughout the colon. No disproportionate dilatation of bowel There is laxity of the anterior abdominal wall with diastasis of the rectus abdominus muscles. Prominent stool burden in the transverse, descending, and sigmoid colon. IMPRESSION: No acute intra-abdominal pathology. Normal appendix. Prominent stool burden in the colon. Electronically Signed   By: Jolaine Click M.D.   On: 12/08/2015 10:26   I have personally reviewed and evaluated these images and lab results as part of my medical decision-making.   EKG Interpretation None      MDM   Final diagnoses:  Abdominal pain, unspecified abdominal location  Constipation, unspecified constipation type    Patient states no diarrhea for the last 2 days. Normally she has a bowel movement per day. No vomiting for the last 2 days as well. CT scan shows a preponderance of stool in  the rectosigmoid. However diverticulitis. No obvious uterine or adnexal abnormalities. No appendicitis. No renal abnormalities or stone. Has mild leukopenia. Possibly a viral etiology. Plan MiraLAX when necessary. Primary care follow-up. ER with acute changes.    Rolland PorterMark Jerrik Housholder, MD 12/08/15 1046

## 2016-02-16 ENCOUNTER — Emergency Department (HOSPITAL_COMMUNITY)
Admission: EM | Admit: 2016-02-16 | Discharge: 2016-02-16 | Disposition: A | Discharge status: Home / Self Care | Payer: Medicaid Other | Attending: Emergency Medicine | Admitting: Emergency Medicine

## 2016-02-16 ENCOUNTER — Encounter (HOSPITAL_COMMUNITY): Payer: Self-pay | Admitting: Physical Medicine and Rehabilitation

## 2016-02-16 DIAGNOSIS — R112 Nausea with vomiting, unspecified: Secondary | ICD-10-CM | POA: Diagnosis present

## 2016-02-16 DIAGNOSIS — K2970 Gastritis, unspecified, without bleeding: Secondary | ICD-10-CM | POA: Diagnosis not present

## 2016-02-16 LAB — URINALYSIS, ROUTINE W REFLEX MICROSCOPIC
BILIRUBIN URINE: NEGATIVE
Glucose, UA: NEGATIVE mg/dL
Hgb urine dipstick: NEGATIVE
KETONES UR: 15 mg/dL — AB
NITRITE: NEGATIVE
PROTEIN: NEGATIVE mg/dL
Specific Gravity, Urine: 1.031 — ABNORMAL HIGH (ref 1.005–1.030)
pH: 5.5 (ref 5.0–8.0)

## 2016-02-16 LAB — CBC WITH DIFFERENTIAL/PLATELET
Basophils Absolute: 0 10*3/uL (ref 0.0–0.1)
Basophils Relative: 1 %
EOS ABS: 0.1 10*3/uL (ref 0.0–0.7)
EOS PCT: 3 %
HCT: 30.8 % — ABNORMAL LOW (ref 36.0–46.0)
Hemoglobin: 9.4 g/dL — ABNORMAL LOW (ref 12.0–15.0)
LYMPHS ABS: 1.6 10*3/uL (ref 0.7–4.0)
Lymphocytes Relative: 40 %
MCH: 23 pg — ABNORMAL LOW (ref 26.0–34.0)
MCHC: 30.5 g/dL (ref 30.0–36.0)
MCV: 75.3 fL — AB (ref 78.0–100.0)
Monocytes Absolute: 0.4 10*3/uL (ref 0.1–1.0)
Monocytes Relative: 10 %
Neutro Abs: 1.9 10*3/uL (ref 1.7–7.7)
Neutrophils Relative %: 46 %
Platelets: 329 10*3/uL (ref 150–400)
RBC: 4.09 MIL/uL (ref 3.87–5.11)
RDW: 17.6 % — AB (ref 11.5–15.5)
WBC: 4 10*3/uL (ref 4.0–10.5)

## 2016-02-16 LAB — COMPREHENSIVE METABOLIC PANEL
ALT: 14 U/L (ref 14–54)
ANION GAP: 5 (ref 5–15)
AST: 20 U/L (ref 15–41)
Albumin: 3.7 g/dL (ref 3.5–5.0)
Alkaline Phosphatase: 51 U/L (ref 38–126)
BILIRUBIN TOTAL: 0.3 mg/dL (ref 0.3–1.2)
BUN: 10 mg/dL (ref 6–20)
CO2: 24 mmol/L (ref 22–32)
Calcium: 8.9 mg/dL (ref 8.9–10.3)
Chloride: 108 mmol/L (ref 101–111)
Creatinine, Ser: 0.66 mg/dL (ref 0.44–1.00)
Glucose, Bld: 90 mg/dL (ref 65–99)
POTASSIUM: 3.6 mmol/L (ref 3.5–5.1)
Sodium: 137 mmol/L (ref 135–145)
TOTAL PROTEIN: 6.8 g/dL (ref 6.5–8.1)

## 2016-02-16 LAB — URINE MICROSCOPIC-ADD ON

## 2016-02-16 LAB — LIPASE, BLOOD: LIPASE: 19 U/L (ref 11–51)

## 2016-02-16 LAB — POC URINE PREG, ED: Preg Test, Ur: NEGATIVE

## 2016-02-16 MED ORDER — ONDANSETRON 8 MG PO TBDP: 8.0000 mg | ORAL_TABLET | Freq: Three times a day (TID) | ORAL | Status: DC | PRN

## 2016-02-16 MED ORDER — SUCRALFATE 1 G PO TABS: 1.0000 g | ORAL_TABLET | Freq: Three times a day (TID) | ORAL | Status: DC

## 2016-02-16 MED ORDER — SODIUM CHLORIDE 0.9 % IV BOLUS (SEPSIS)
1000.0000 mL | Freq: Once | INTRAVENOUS | Status: AC
  Administered 2016-02-16: 1000 mL via INTRAVENOUS

## 2016-02-16 MED ORDER — GI COCKTAIL ~~LOC~~
30.0000 mL | Freq: Once | ORAL | Status: AC
  Administered 2016-02-16: 30 mL via ORAL
  Filled 2016-02-16: qty 30

## 2016-02-16 MED ORDER — ONDANSETRON HCL 4 MG/2ML IJ SOLN
4.0000 mg | Freq: Once | INTRAMUSCULAR | Status: AC
  Administered 2016-02-16: 4 mg via INTRAVENOUS
  Filled 2016-02-16: qty 2

## 2016-02-16 MED ORDER — OMEPRAZOLE 20 MG PO CPDR: 20.0000 mg | DELAYED_RELEASE_CAPSULE | Freq: Every day | ORAL | Status: DC

## 2016-02-16 NOTE — Discharge Instructions (Signed)
Take prilosec daily. carafate as prescribed to help with symptoms. Zofran for nausea as needed. See food recommendations below. Avoid smoking or alcohol. Avoid any NSAID medications. Follow up with your doctor.    Gastritis, Adult Gastritis is soreness and puffiness (inflammation) of the lining of the stomach. If you do not get help, gastritis can cause bleeding and sores (ulcers) in the stomach. HOME CARE   Only take medicine as told by your doctor.  If you were given antibiotic medicines, take them as told. Finish the medicines even if you start to feel better.  Drink enough fluids to keep your pee (urine) clear or pale yellow.  Avoid foods and drinks that make your problems worse. Foods you may want to avoid include:  Caffeine or alcohol.  Chocolate.  Mint.  Garlic and onions.  Spicy foods.  Citrus fruits, including oranges, lemons, or limes.  Food containing tomatoes, including sauce, chili, salsa, and pizza.  Fried and fatty foods.  Eat small meals throughout the day instead of large meals. GET HELP RIGHT AWAY IF:   You have black or dark red poop (stools).  You throw up (vomit) blood. It may look like coffee grounds.  You cannot keep fluids down.  Your belly (abdominal) pain gets worse.  You have a fever.  You do not feel better after 1 week.  You have any other questions or concerns. MAKE SURE YOU:   Understand these instructions.  Will watch your condition.  Will get help right away if you are not doing well or get worse.   This information is not intended to replace advice given to you by your health care provider. Make sure you discuss any questions you have with your health care provider.   Document Released: 02/11/2008 Document Revised: 11/17/2011 Document Reviewed: 10/08/2011 Elsevier Interactive Patient Education 2016 [employer].  Food Choices for Gastroesophageal Reflux Disease, Adult When you have gastroesophageal reflux disease (GERD),  the foods you eat and your eating habits are very important. Choosing the right foods can help ease the discomfort of GERD. WHAT GENERAL GUIDELINES DO I NEED TO FOLLOW? Choose fruits, vegetables, whole grains, low-fat dairy products, and low-fat meat, fish, and poultry. Limit fats such as oils, salad dressings, butter, nuts, and avocado. Keep a food diary to identify foods that cause symptoms. Avoid foods that cause reflux. These may be different for different people. Eat frequent small meals instead of three large meals each day. Eat your meals slowly, in a relaxed setting. Limit fried foods. Cook foods using methods other than frying. Avoid drinking alcohol. Avoid drinking large amounts of liquids with your meals. Avoid bending over or lying down until 2-3 hours after eating. WHAT FOODS ARE NOT RECOMMENDED? The following are some foods and drinks that may worsen your symptoms: Vegetables Tomatoes. Tomato juice. Tomato and spaghetti sauce. Chili peppers. Onion and garlic. Horseradish. Fruits Oranges, grapefruit, and lemon (fruit and juice). Meats High-fat meats, fish, and poultry. This includes hot dogs, ribs, ham, sausage, salami, and bacon. Dairy Whole milk and chocolate milk. Sour cream. Cream. Butter. Ice cream. Cream cheese.  Beverages Coffee and tea, with or without caffeine. Carbonated beverages or energy drinks. Condiments Hot sauce. Barbecue sauce.  Sweets/Desserts Chocolate and cocoa. Donuts. Peppermint and spearmint. Fats and Oils High-fat foods, including [place] fries and potato chips. Other Vinegar. Strong spices, such as black pepper, white pepper, red pepper, cayenne, curry powder, cloves, ginger, and chili powder. The items listed above may not be a complete list of  foods and beverages to avoid. Contact your dietitian for more information.   This information is not intended to replace advice given to you by your health care provider. Make sure you discuss any  questions you have with your health care provider.   Document Released: 08/25/2005 Document Revised: 09/15/2014 Document Reviewed: 06/29/2013 Elsevier Interactive Patient Education [employer] Elsevier Inc.

## 2016-02-16 NOTE — ED Provider Notes (Signed)
CSN: [id number]     Arrival date & time 02/16/16  [id number] History   First MD Initiated Contact with Patient 02/16/16 0930     Chief Complaint  Patient presents with  . Emesis     (Consider location/radiation/quality/duration/timing/severity/associated sxs/prior Treatment) HPI Nicole [name] is a 25 y.o. female presents to emergency department complaining of nausea and vomiting. Patient states she has been vomiting for 3 days. She is unable to keep anything down. She reports approximately 15 episodes of emesis over the last three days and 5 today. Denies any abdominal pain. Denies any diarrhea. No urinary symptoms or vaginal discharge or bleeding. Last menstrual cycle was one month ago. Unsure if she is pregnant. She does report that she has had some belching and burping, and burning sensation in her throat after eating. Patient states she tried Pepto when her symptoms started but it did not help. Denies any history of the same. Denies any drug use. No associated symptoms.  Past Medical History  Diagnosis Date  . No pertinent past medical history   . Medical history non-contributory   . Pregnancy induced hypertension 2011   Past Surgical History  Procedure Laterality Date  . Wisdom tooth extraction  2010  . Cesarean section  10/14/2011    Procedure: CESAREAN SECTION;  Surgeon: Kathreen CosierBernard A Marshall, MD;  Location: [facility];  Service: Gynecology;  Laterality: N/A;  Primary Cesarean Section Delivery Baby Boy @ 0001, Apgars 8/9   Family History  Problem Relation Age of Onset  . Hypertension Mother   . Hypertension Father    Social History  Substance Use Topics  . Smoking status: Never Smoker   . Smokeless tobacco: Never Used  . Alcohol Use: No   OB History    Gravida Para Term Preterm AB TAB SAB Ectopic Multiple Living   3 3 3  0 0 0 0 0 0 3     Review of Systems  Constitutional: Negative for fever and chills.  Respiratory: Negative for cough, chest tightness and shortness of breath.    Cardiovascular: Negative for chest pain, palpitations and leg swelling.  Gastrointestinal: Positive for nausea and vomiting. Negative for abdominal pain, diarrhea and blood in stool.  Genitourinary: Negative for dysuria, flank pain, vaginal bleeding, vaginal discharge, vaginal pain and pelvic pain.  Musculoskeletal: Negative for myalgias, arthralgias, neck pain and neck stiffness.  Skin: Negative for rash.  Neurological: Negative for dizziness, weakness and headaches.  All other systems reviewed and are negative.     Allergies  Review of patient's allergies indicates no known allergies.  Home Medications   Prior to Admission medications   Medication Sig Start Date End Date Taking? Authorizing Provider  bismuth subsalicylate (PEPTO BISMOL) 262 MG/15ML suspension Take 30 mLs by mouth every 6 (six) hours as needed for indigestion or diarrhea or loose stools.   Yes Historical Provider, MD  ibuprofen (ADVIL,MOTRIN) 200 MG tablet Take 600 mg by mouth every 6 (six) hours as needed for moderate pain.   Yes Historical Provider, MD   BP 125/83 mmHg  Pulse 75  Temp(Src) 98.5 F (36.9 C) (Oral)  Resp 18  Ht 5\' 5"  (1.651 m)  Wt 81.647 kg  BMI 29.95 kg/m2  SpO2 100% Physical Exam  Constitutional: She is oriented to person, place, and time. She appears well-developed and well-nourished. No distress.  HENT:  Head: Normocephalic.  Eyes: Conjunctivae are normal.  Neck: Neck supple.  Cardiovascular: Normal rate, regular rhythm and normal heart sounds.   Pulmonary/Chest: Effort normal and  breath sounds normal. No respiratory distress. She has no wheezes. She has no rales.  Abdominal: Soft. Bowel sounds are normal. She exhibits no distension. There is no tenderness. There is no rebound and no guarding.  Musculoskeletal: She exhibits no edema.  Neurological: She is alert and oriented to person, place, and time.  Skin: Skin is warm and dry.  Psychiatric: She has a normal mood and affect. Her  behavior is normal.  Nursing note and vitals reviewed.   ED Course  Procedures (including critical care time) Labs Review Labs Reviewed  CBC WITH DIFFERENTIAL/PLATELET  COMPREHENSIVE METABOLIC PANEL  LIPASE, BLOOD  URINALYSIS, ROUTINE W REFLEX MICROSCOPIC (NOT AT [facility])  POC URINE PREG, ED    Imaging Review No results found. I have personally reviewed and evaluated these images and lab results as part of my medical decision-making.   EKG Interpretation None      MDM   Final diagnoses:  Gastritis   Patient with nausea and vomiting for the last 3 days, no diarrhea, no abdominal pain, urinary symptoms, no vaginal complaints. Vital signs are all within normal. Abdomen is soft, nontender, benign. We'll check urine, basic labs, will try GI cocktail and Zofran. IV fluids ordered.  12:14 PM Labs show anemia. Chronic. Labs otherwise unremarkable. Relief of symptoms after GI cocktail. Nausea improved with Zofran as well. Patient is able to drink without vomiting. She states she feels much better. She is ready for discharge home. Most likely gastritis versus GERD. Will have her follow-up with primary care doctor. Start Prilosec, Carafate, Zofran for nausea. Return precautions discussed  Filed Vitals:   02/16/16 0926 02/16/16 1100 02/16/16 1115  BP: 125/83 130/71 120/81  Pulse: 75 77 65  Temp: 98.5 F (36.9 C)    TempSrc: Oral    Resp: Height:  (1.651 m)    Weight: 81.647 kg    SpO2: 100% 100% 100%       Jaynie Crumble, PA-C 02/16/16 1643  Raeford Razor, MD 02/29/16 2142

## 2016-02-16 NOTE — ED Notes (Signed)
Pt reports nausea/vomiting x2 days. Unable to keep food/fluids down. Denies abdominal pain.

## 2016-02-18 LAB — URINE CULTURE: Culture: 3000 — AB

## 2016-02-25 ENCOUNTER — Emergency Department (HOSPITAL_COMMUNITY): Payer: Medicaid Other

## 2016-02-25 ENCOUNTER — Encounter (HOSPITAL_COMMUNITY): Payer: Self-pay

## 2016-02-25 ENCOUNTER — Emergency Department (HOSPITAL_COMMUNITY)
Admission: EM | Admit: 2016-02-25 | Discharge: 2016-02-25 | Disposition: A | Discharge status: Home / Self Care | Payer: Medicaid Other | Attending: Emergency Medicine | Admitting: Emergency Medicine

## 2016-02-25 DIAGNOSIS — Y929 Unspecified place or not applicable: Secondary | ICD-10-CM | POA: Insufficient documentation

## 2016-02-25 DIAGNOSIS — Z5321 Procedure and treatment not carried out due to patient leaving prior to being seen by health care provider: Secondary | ICD-10-CM

## 2016-02-25 DIAGNOSIS — Y999 Unspecified external cause status: Secondary | ICD-10-CM | POA: Diagnosis not present

## 2016-02-25 DIAGNOSIS — W2201XA Walked into wall, initial encounter: Secondary | ICD-10-CM | POA: Insufficient documentation

## 2016-02-25 DIAGNOSIS — Y939 Activity, unspecified: Secondary | ICD-10-CM | POA: Diagnosis not present

## 2016-02-25 DIAGNOSIS — S60921A Unspecified superficial injury of right hand, initial encounter: Secondary | ICD-10-CM | POA: Diagnosis present

## 2016-02-25 DIAGNOSIS — Z79899 Other long term (current) drug therapy: Secondary | ICD-10-CM | POA: Insufficient documentation

## 2016-02-25 DIAGNOSIS — S62306A Unspecified fracture of fifth metacarpal bone, right hand, initial encounter for closed fracture: Secondary | ICD-10-CM | POA: Insufficient documentation

## 2016-02-25 NOTE — ED Notes (Signed)
Unable to locate PT in front lobby or sub waiting. 

## 2016-02-25 NOTE — ED Notes (Signed)
Pt called x 2 with no answer  
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
## 2016-02-25 NOTE — ED Notes (Signed)
Called for patient 2x in waiting room. Looked in sub waiting. No answer.

## 2016-03-10 NOTE — ED Provider Notes (Signed)
It appears, from nursing notes, the patient left the department without being seen.   Marily MemosJason Aerabella Galasso, MD 03/10/16 [phone]

## 2016-07-16 LAB — PROCEDURE REPORT - SCANNED: Pap: ABNORMAL — AB

## 2016-10-15 ENCOUNTER — Encounter (HOSPITAL_COMMUNITY): Payer: Self-pay | Admitting: *Deleted

## 2016-10-15 ENCOUNTER — Inpatient Hospital Stay (HOSPITAL_COMMUNITY)
Admission: AD | Admit: 2016-10-15 | Discharge: 2016-10-15 | Disposition: A | Discharge status: Home / Self Care | Payer: Medicaid Other | Source: Ambulatory Visit | Attending: Obstetrics & Gynecology | Admitting: Obstetrics & Gynecology

## 2016-10-15 ENCOUNTER — Emergency Department (HOSPITAL_COMMUNITY)
Admission: EM | Admit: 2016-10-15 | Discharge: 2016-10-15 | Discharge status: Against Medical Advice | Payer: Medicaid Other

## 2016-10-15 DIAGNOSIS — R112 Nausea with vomiting, unspecified: Secondary | ICD-10-CM

## 2016-10-15 DIAGNOSIS — D649 Anemia, unspecified: Secondary | ICD-10-CM | POA: Diagnosis not present

## 2016-10-15 DIAGNOSIS — Z8249 Family history of ischemic heart disease and other diseases of the circulatory system: Secondary | ICD-10-CM | POA: Insufficient documentation

## 2016-10-15 DIAGNOSIS — Z3202 Encounter for pregnancy test, result negative: Secondary | ICD-10-CM | POA: Diagnosis not present

## 2016-10-15 DIAGNOSIS — E876 Hypokalemia: Secondary | ICD-10-CM | POA: Insufficient documentation

## 2016-10-15 LAB — COMPREHENSIVE METABOLIC PANEL
ALT: 11 U/L — ABNORMAL LOW (ref 14–54)
ANION GAP: 6 (ref 5–15)
AST: 19 U/L (ref 15–41)
Albumin: 4 g/dL (ref 3.5–5.0)
Alkaline Phosphatase: 51 U/L (ref 38–126)
BUN: 7 mg/dL (ref 6–20)
CHLORIDE: 107 mmol/L (ref 101–111)
CO2: 25 mmol/L (ref 22–32)
Calcium: 8.8 mg/dL — ABNORMAL LOW (ref 8.9–10.3)
Creatinine, Ser: 0.55 mg/dL (ref 0.44–1.00)
GFR calc non Af Amer: >60 mL/min (ref >60)
Glucose, Bld: 102 mg/dL — ABNORMAL HIGH (ref 65–99)
POTASSIUM: 3.1 mmol/L — AB (ref 3.5–5.1)
Sodium: 138 mmol/L (ref 135–145)
TOTAL PROTEIN: 7.2 g/dL (ref 6.5–8.1)
Total Bilirubin: 0.3 mg/dL (ref 0.3–1.2)

## 2016-10-15 LAB — URINALYSIS, ROUTINE W REFLEX MICROSCOPIC
BACTERIA UA: NONE SEEN
BILIRUBIN URINE: NEGATIVE
Glucose, UA: NEGATIVE mg/dL
HGB URINE DIPSTICK: NEGATIVE
KETONES UR: NEGATIVE mg/dL
NITRITE: NEGATIVE
Protein, ur: NEGATIVE mg/dL
RBC / HPF: NONE SEEN RBC/hpf (ref 0–5)
Specific Gravity, Urine: 1.001 — ABNORMAL LOW (ref 1.005–1.030)
pH: 6 (ref 5.0–8.0)

## 2016-10-15 LAB — CBC
HCT: 29.3 % — ABNORMAL LOW (ref 36.0–46.0)
Hemoglobin: 9.6 g/dL — ABNORMAL LOW (ref 12.0–15.0)
MCH: 25.5 pg — ABNORMAL LOW (ref 26.0–34.0)
MCHC: 32.8 g/dL (ref 30.0–36.0)
MCV: 77.7 fL — ABNORMAL LOW (ref 78.0–100.0)
PLATELETS: 345 10*3/uL (ref 150–400)
RBC: 3.77 MIL/uL — ABNORMAL LOW (ref 3.87–5.11)
RDW: 16 % — AB (ref 11.5–15.5)
WBC: 4.7 10*3/uL (ref 4.0–10.5)

## 2016-10-15 LAB — POCT PREGNANCY, URINE: Preg Test, Ur: NEGATIVE

## 2016-10-15 MED ORDER — ONDANSETRON 8 MG PO TBDP
8.0000 mg | ORAL_TABLET | Freq: Once | ORAL | Status: AC
  Administered 2016-10-15: 8 mg via ORAL
  Filled 2016-10-15: qty 1

## 2016-10-15 MED ORDER — PROMETHAZINE HCL 25 MG PO TABS: 25.0000 mg | ORAL_TABLET | Freq: Four times a day (QID) | ORAL | 0 refills | Status: DC | PRN

## 2016-10-15 MED ORDER — POTASSIUM CHLORIDE CRYS ER 20 MEQ PO TBCR: 20.0000 meq | EXTENDED_RELEASE_TABLET | Freq: Two times a day (BID) | ORAL | 0 refills | Status: DC

## 2016-10-15 NOTE — MAU Provider Note (Signed)
History     CSN: [id number]  Arrival date and time: 10/15/16 1218   First Provider Initiated Contact with Patient 10/15/16 1253      Chief Complaint  Patient presents with  . Nausea  . Emesis   HPI Nicole [name] is a 26 y.o. [id number] female who presents for nausea & vomiting. Reports nausea for that last few months. Vomiting started 4 days ago. States she's been having trouble keeping down food but has been able to tolerate water & juice. Vomited 5 times today. Denies abdominal pain, fever, vaginal discharge, vaginal bleeding, heartburn, or sick contacts. Does not have antiemetic. Has PCP listed on her medicaid card but has never been to them.   Past Medical History:  Diagnosis Date  . Pregnancy induced hypertension 2011    Past Surgical History:  Procedure Laterality Date  . CESAREAN SECTION  10/14/2011   Procedure: CESAREAN SECTION;  Surgeon: Kathreen CosierBernard A Marshall, MD;  Location: [facility];  Service: Gynecology;  Laterality: N/A;  Primary Cesarean Section Delivery Baby Boy @ 0001, Apgars 8/9  . WISDOM TOOTH EXTRACTION  2010    Family History  Problem Relation Age of Onset  . Hypertension Mother   . Hypertension Father     Social History  Substance Use Topics  . Smoking status: Never Smoker  . Smokeless tobacco: Never Used  . Alcohol use No    Allergies: No Known Allergies  Prescriptions Prior to Admission  Medication Sig Dispense Refill Last Dose  . bismuth subsalicylate (PEPTO BISMOL) 262 MG/15ML suspension Take 30 mLs by mouth every 6 (six) hours as needed for indigestion or diarrhea or loose stools.   Past Week at Unknown time  . ibuprofen (ADVIL,MOTRIN) 200 MG tablet Take 600 mg by mouth every 6 (six) hours as needed for moderate pain.   prn  . omeprazole (PRILOSEC) 20 MG capsule Take 1 capsule (20 mg total) by mouth daily. 30 capsule 0   . ondansetron (ZOFRAN ODT) 8 MG disintegrating tablet Take 1 tablet (8 mg total) by mouth every 8 (eight) hours as needed for nausea or  vomiting. 10 tablet 0   . sucralfate (CARAFATE) 1 g tablet Take 1 tablet (1 g total) by mouth 4 (four) times daily -  with meals and at bedtime. 30 tablet 0     Review of Systems  Constitutional: Positive for appetite change. Negative for chills, fever and unexpected weight change.  Gastrointestinal: Positive for constipation, nausea and vomiting. Negative for abdominal pain.  Genitourinary: Negative.    Physical Exam   Blood pressure 128/92, pulse 75, temperature 98.3 F (36.8 C), temperature source Oral, resp. rate 16, weight 148 lb 1.3 oz (67.2 kg), SpO2 100 %, unknown if currently breastfeeding.  Physical Exam  Nursing note and vitals reviewed. Constitutional: She is oriented to person, place, and time. She appears well-developed and well-nourished. No distress.  HENT:  Head: Normocephalic and atraumatic.  Eyes: Conjunctivae are normal. Right eye exhibits no discharge. Left eye exhibits no discharge. No scleral icterus.  Neck: Normal range of motion.  Cardiovascular: Normal rate, regular rhythm and normal heart sounds.   No murmur heard. Respiratory: Effort normal and breath sounds normal. No respiratory distress. She has no wheezes.  GI: Soft. Bowel sounds are normal. She exhibits no distension. There is no tenderness. There is no rebound and no guarding.  Neurological: She is alert and oriented to person, place, and time.  Skin: Skin is warm and dry. She is not diaphoretic.  Psychiatric: She  has a normal mood and affect. Her behavior is normal. Judgment and thought content normal.    MAU Course  Procedures Results for orders placed or performed during the hospital encounter of 10/15/16 (from the past 24 hour(s))  Urinalysis, Routine w reflex microscopic (not at [facility])     Status: Abnormal   Collection Time: 10/15/16 12:25 PM  Result Value Ref Range   Color, Urine COLORLESS (A) YELLOW   APPearance CLEAR CLEAR   Specific Gravity, Urine 1.001 (L) 1.005 - 1.030   pH 6.0 5.0 -  8.0   Glucose, UA NEGATIVE NEGATIVE mg/dL   Hgb urine dipstick NEGATIVE NEGATIVE   Bilirubin Urine NEGATIVE NEGATIVE   Ketones, ur NEGATIVE NEGATIVE mg/dL   Protein, ur NEGATIVE NEGATIVE mg/dL   Nitrite NEGATIVE NEGATIVE   Leukocytes, UA SMALL (A) NEGATIVE   RBC / HPF NONE SEEN 0 - 5 RBC/hpf   WBC, UA 0-5 0 - 5 WBC/hpf   Bacteria, UA NONE SEEN NONE SEEN   Squamous Epithelial / LPF 0-5 (A) NONE SEEN  Pregnancy, urine POC     Status: None   Collection Time: 10/15/16 12:39 PM  Result Value Ref Range   Preg Test, Ur NEGATIVE NEGATIVE  CBC     Status: Abnormal   Collection Time: 10/15/16  1:15 PM  Result Value Ref Range   WBC 4.7 4.0 - 10.5 K/uL   RBC 3.77 (L) 3.87 - 5.11 MIL/uL   Hemoglobin 9.6 (L) 12.0 - 15.0 g/dL   HCT 16.1 (L) 09.6 - 04.5 %   MCV 77.7 (L) 78.0 - 100.0 fL   MCH 25.5 (L) 26.0 - 34.0 pg   MCHC 32.8 30.0 - 36.0 g/dL   RDW 40.9 (H) 81.1 - 91.4 %   Platelets 345 150 - 400 K/uL  Comprehensive metabolic panel     Status: Abnormal   Collection Time: 10/15/16  1:15 PM  Result Value Ref Range   Sodium 138 135 - 145 mmol/L   Potassium 3.1 (L) 3.5 - 5.1 mmol/L   Chloride 107 101 - 111 mmol/L   CO2 25 22 - 32 mmol/L   Glucose, Bld 102 (H) 65 - 99 mg/dL   BUN 7 6 - 20 mg/dL   Creatinine, Ser 7.82 0.44 - 1.00 mg/dL   Calcium 8.8 (L) 8.9 - 10.3 mg/dL   Total Protein 7.2 6.5 - 8.1 g/dL   Albumin 4.0 3.5 - 5.0 g/dL   AST 19 15 - 41 U/L   ALT 11 (L) 14 - 54 U/L   Alkaline Phosphatase 51 38 - 126 U/L   Total Bilirubin 0.3 0.3 - 1.2 mg/dL   GFR calc non Af Amer >60 >60 mL/min   GFR calc Af Amer >60 >60 mL/min   Anion gap 6 5 - 15    MDM UPT negative Pt not observed vomiting in MAU Zofran 8 mg ODT given -- pt reports improvement in symptoms  Assessment and Plan  A; 1. Non-intractable vomiting with nausea, unspecified vomiting type   2. Anemia, unspecified type   3. Hypokalemia   4. Negative pregnancy test    P: Discharge home Rx phenergan & kdur  Make appt  with PCP for routine care & nausea evaluation Go to ED for n/v associated with abdominal pain or fever  [name] 10/15/2016, 12:53 PM

## 2016-10-15 NOTE — MAU Note (Signed)
Patient presents to mau with c/o nausea and vomiting "for a while". States has vomited 4-5 times today and is unable to keep anything down. Denies diarrhea or pain at this time. States temp at home was 99.

## 2016-10-15 NOTE — ED Notes (Signed)
No answer x3

## 2016-10-15 NOTE — ED Notes (Signed)
No answer x 2 for triage.

## 2016-10-15 NOTE — Discharge Instructions (Signed)
Hypokalemia Hypokalemia means that the amount of potassium in the blood is lower than normal.Potassium is a chemical that helps regulate the amount of fluid in the body (electrolyte). It also stimulates muscle tightening (contraction) and helps nerves work properly.Normally, most of the bodys potassium is inside of cells, and only a very small amount is in the blood. Because the amount in the blood is so small, minor changes to potassium levels in the blood can be life-threatening. What are the causes? This condition may be caused by:  Antibiotic medicine.  Diarrhea or vomiting. Taking too much of a medicine that helps you have a bowel movement (laxative) can cause diarrhea and lead to hypokalemia.  Chronic kidney disease (CKD).  Medicines that help the body get rid of excess fluid (diuretics).  Eating disorders, such as bulimia.  Low magnesium levels in the body.  Sweating a lot. What are the signs or symptoms? Symptoms of this condition include:  Weakness.  Constipation.  Fatigue.  Muscle cramps.  Mental confusion.  Skipped heartbeats or irregular heartbeat (palpitations).  Tingling or numbness. How is this diagnosed? This condition is diagnosed with a blood test. How is this treated? Hypokalemia can be treated by taking potassium supplements by mouth or adjusting the medicines that you take. Treatment may also include eating more foods that contain a lot of potassium. If your potassium level is very low, you may need to get potassium through an IV tube in one of your veins and be monitored in the hospital. Follow these instructions at home:  Take over-the-counter and prescription medicines only as told by your health care provider. This includes vitamins and supplements.  Eat a healthy diet. A healthy diet includes fresh fruits and vegetables, whole grains, healthy fats, and lean proteins.  If instructed, eat more foods that contain a lot of potassium, such  as:  Nuts, such as peanuts and pistachios.  Seeds, such as sunflower seeds and pumpkin seeds.  Peas, lentils, and lima beans.  Whole grain and bran cereals and breads.  Fresh fruits and vegetables, such as apricots, avocado, bananas, cantaloupe, kiwi, oranges, tomatoes, asparagus, and potatoes.  Orange juice.  Tomato juice.  Red meats.  Yogurt.  Keep all follow-up visits as told by your health care provider. This is important. Contact a health care provider if:  You have weakness that gets worse.  You feel your heart pounding or racing.  You vomit.  You have diarrhea.  You have diabetes (diabetes mellitus) and you have trouble keeping your blood sugar (glucose) in your target range. Get help right away if:  You have chest pain.  You have shortness of breath.  You have vomiting or diarrhea that lasts for more than 2 days.  You faint. This information is not intended to replace advice given to you by your health care provider. Make sure you discuss any questions you have with your health care provider. Document Released: 08/25/2005 Document Revised: 04/12/2016 Document Reviewed: 04/12/2016 Elsevier Interactive Patient Education  2017 [employer] Inc.  Constipation, Adult Constipation is when a person:  Poops (has a bowel movement) fewer times in a week than normal.  Has a hard time pooping.  Has poop that is dry, hard, or bigger than normal. Follow these instructions at home: Eating and drinking  Eat foods that have a lot of fiber, such as:  Fresh fruits and vegetables.  Whole grains.  Beans.  Eat less of foods that are high in fat, low in fiber, or overly processed, such  as:  [place] fries.  Hamburgers.  Cookies.  Candy.  Soda.  Drink enough fluid to keep your pee (urine) clear or pale yellow. General instructions  Exercise regularly or as told by your doctor.  Go to the restroom when you feel like you need to poop. Do not hold it  in.  Take over-the-counter and prescription medicines only as told by your doctor. These include any fiber supplements.  Do pelvic floor retraining exercises, such as:  Doing deep breathing while relaxing your lower belly (abdomen).  Relaxing your pelvic floor while pooping.  Watch your condition for any changes.  Keep all follow-up visits as told by your doctor. This is important. Contact a doctor if:  You have pain that gets worse.  You have a fever.  You have not pooped for 4 days.  You throw up (vomit).  You are not hungry.  You lose weight.  You are bleeding from the anus.  You have thin, pencil-like poop (stool). Get help right away if:  You have a fever, and your symptoms suddenly get worse.  You leak poop or have blood in your poop.  Your belly feels hard or bigger than normal (is bloated).  You have very bad belly pain.  You feel dizzy or you faint. This information is not intended to replace advice given to you by your health care provider. Make sure you discuss any questions you have with your health care provider. Document Released: 02/11/2008 Document Revised: 03/14/2016 Document Reviewed: 02/13/2016 Elsevier Interactive Patient Education  2017 [employer] Inc. Anemia, Nonspecific Anemia is a condition in which the concentration of red blood cells or hemoglobin in the blood is below normal. Hemoglobin is a substance in red blood cells that carries oxygen to the tissues of the body. Anemia results in not enough oxygen reaching these tissues. What are the causes? Common causes of anemia include:  Excessive bleeding. Bleeding may be internal or external. This includes excessive bleeding from periods (in women) or from the intestine.  Poor nutrition.  Chronic kidney, thyroid, and liver disease.  Bone marrow disorders that decrease red blood cell production.  Cancer and treatments for cancer.  HIV, AIDS, and their treatments.  Spleen problems that  increase red blood cell destruction.  Blood disorders.  Excess destruction of red blood cells due to infection, medicines, and autoimmune disorders. What are the signs or symptoms?  Minor weakness.  Dizziness.  Headache.  Palpitations.  Shortness of breath, especially with exercise.  Paleness.  Cold sensitivity.  Indigestion.  Nausea.  Difficulty sleeping.  Difficulty concentrating. Symptoms may occur suddenly or they may develop slowly. How is this diagnosed? Additional blood tests are often needed. These help your health care provider determine the best treatment. Your health care provider will check your stool for blood and look for other causes of blood loss. How is this treated? Treatment varies depending on the cause of the anemia. Treatment can include:  Supplements of iron, vitamin B12, or folic acid.  Hormone medicines.  A blood transfusion. This may be needed if blood loss is severe.  Hospitalization. This may be needed if there is significant continual blood loss.  Dietary changes.  Spleen removal. Follow these instructions at home: Keep all follow-up appointments. It often takes many weeks to correct anemia, and having your health care provider check on your condition and your response to treatment is very important. Get help right away if:  You develop extreme weakness, shortness of breath, or chest pain.  You  become dizzy or have trouble concentrating.  You develop heavy vaginal bleeding.  You develop a rash.  You have bloody or black, tarry stools.  You faint.  You vomit up blood.  You vomit repeatedly.  You have abdominal pain.  You have a fever or persistent symptoms for more than 2-3 days.  You have a fever and your symptoms suddenly get worse.  You are dehydrated. This information is not intended to replace advice given to you by your health care provider. Make sure you discuss any questions you have with your health care  provider. Document Released: 10/02/2004 Document Revised: 02/06/2016 Document Reviewed: 02/18/2013 Elsevier Interactive Patient Education  2017 [employer]

## 2016-12-16 ENCOUNTER — Inpatient Hospital Stay (HOSPITAL_COMMUNITY)
Admission: AD | Admit: 2016-12-16 | Discharge: 2016-12-16 | Discharge status: Against Medical Advice | Payer: Medicaid Other | Source: Ambulatory Visit | Attending: Family Medicine | Admitting: Family Medicine

## 2016-12-16 ENCOUNTER — Encounter (HOSPITAL_COMMUNITY): Payer: Self-pay | Admitting: *Deleted

## 2016-12-16 DIAGNOSIS — R109 Unspecified abdominal pain: Secondary | ICD-10-CM | POA: Diagnosis present

## 2016-12-16 DIAGNOSIS — O26891 Other specified pregnancy related conditions, first trimester: Secondary | ICD-10-CM | POA: Diagnosis not present

## 2016-12-16 DIAGNOSIS — Z8249 Family history of ischemic heart disease and other diseases of the circulatory system: Secondary | ICD-10-CM | POA: Diagnosis not present

## 2016-12-16 DIAGNOSIS — Z5321 Procedure and treatment not carried out due to patient leaving prior to being seen by health care provider: Secondary | ICD-10-CM | POA: Diagnosis not present

## 2016-12-16 DIAGNOSIS — Z3202 Encounter for pregnancy test, result negative: Secondary | ICD-10-CM | POA: Diagnosis not present

## 2016-12-16 LAB — URINALYSIS, MICROSCOPIC (REFLEX)

## 2016-12-16 LAB — URINALYSIS, ROUTINE W REFLEX MICROSCOPIC
BILIRUBIN URINE: NEGATIVE
GLUCOSE, UA: NEGATIVE mg/dL
HGB URINE DIPSTICK: NEGATIVE
KETONES UR: NEGATIVE mg/dL
NITRITE: NEGATIVE
PH: 8.5 — AB (ref 5.0–8.0)
Protein, ur: 30 mg/dL — AB
SPECIFIC GRAVITY, URINE: 1.015 (ref 1.005–1.030)

## 2016-12-16 LAB — POCT PREGNANCY, URINE: Preg Test, Ur: NEGATIVE

## 2016-12-16 NOTE — MAU Note (Signed)
Not in lobby

## 2016-12-16 NOTE — MAU Provider Note (Signed)
History     CSN: [id number]  Arrival date and time: 12/16/16 1518   First Provider Initiated Contact with Patient 12/16/16 1608      Chief Complaint  Patient presents with  . abominal pain   Patient is a 26 year old G4 P3 at 9 weeks and 0 days by last menstrual period she presents today stating she took a pregnancy test at home that gave her a 6-12 week pregnancy window. She said her test doesn't just a positive or negative it gets these pregnancy windows. She reports that she has crampy abdominal pain. It's been present for approximately 2-3 days and is getting worse. She denies any vaginal bleeding. She has no history of ectopic before. She reports the pain as a 6-7 out of 10.    OB History    Gravida Para Term Preterm AB Living   0 0 3   SAB TAB Ectopic Multiple Live Births   0 0 0 0 3      Past Medical History:  Diagnosis Date  . Pregnancy induced hypertension 2011    Past Surgical History:  Procedure Laterality Date  . CESAREAN SECTION  10/14/2011   Procedure: CESAREAN SECTION;  Surgeon: Kathreen Cosier, MD;  Location: [facility];  Service: Gynecology;  Laterality: N/A;  Primary Cesarean Section Delivery Baby Boy @ 0001, Apgars 8/9  . WISDOM TOOTH EXTRACTION  2010    Family History  Problem Relation Age of Onset  . Hypertension Mother   . Hypertension Father     Social History  Substance Use Topics  . Smoking status: Never Smoker  . Smokeless tobacco: Never Used  . Alcohol use No    Allergies: No Known Allergies  No prescriptions prior to admission.    Review of Systems  Constitutional: Negative for chills and fever.  HENT: Negative for congestion and rhinorrhea.   Respiratory: Negative for cough and shortness of breath.   Cardiovascular: Negative for chest pain and palpitations.  Gastrointestinal: Positive for abdominal pain. Negative for abdominal distention, constipation, diarrhea, nausea and vomiting.  Genitourinary: Negative for difficulty  urinating, dysuria, flank pain and frequency.  Neurological: Negative for dizziness and weakness.   Physical Exam   Blood pressure 116/81, pulse 63, temperature 98.6 F (37 C), temperature source Oral, resp. rate 16, weight 146 lb 8 oz (66.5 kg), last menstrual period 10/14/2016, SpO2 100 %, unknown if currently breastfeeding.  Physical Exam  Constitutional: She is oriented to person, place, and time. She appears well-developed and well-nourished.  Cardiovascular: Normal rate and intact distal pulses.   Respiratory: Effort normal. No respiratory distress.  GI: Soft. She exhibits no distension. There is no rebound and no guarding.  Minimal abdominal tenderness worse suprapubic  Musculoskeletal: Normal range of motion. She exhibits no edema.  Neurological: She is alert and oriented to person, place, and time. No cranial nerve deficit.  Skin: Skin is warm and dry.  Psychiatric: She has a normal mood and affect. Her behavior is normal.    MAU Course  Procedures  MDM In MA U patient underwent physical exam and was informed that her urine pregnancy test was negative. However given that she had a positive pregnancy test at home and had a negative pregnancy test here we would recommend serum beta hCG testing given her abdominal pain there is some concern for ectopic pregnancy. Her suprapubic pain could suggest UTI as well.  One lab went to collect her blood patient had left AGAINST MEDICAL ADVICE at that  time.  Assessment and Plan  No plan or assessment was able to be formulated as patient left before laboratory evaluation was complete.  Ernestina Penna 12/16/2016, 4:49 PM
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
## 2016-12-16 NOTE — MAU Note (Signed)
Really bad stomach pain, rt side, upper and lower. Started 2 days ago. +HPT 2 days ago, tes read "6-12wks"

## 2017-05-01 ENCOUNTER — Inpatient Hospital Stay (HOSPITAL_COMMUNITY)
Admission: AD | Admit: 2017-05-01 | Discharge: 2017-05-01 | Discharge status: Against Medical Advice | Payer: Medicaid Other | Source: Ambulatory Visit | Attending: Family Medicine | Admitting: Family Medicine

## 2017-05-01 DIAGNOSIS — Z5321 Procedure and treatment not carried out due to patient leaving prior to being seen by health care provider: Secondary | ICD-10-CM | POA: Diagnosis not present

## 2017-05-01 LAB — POCT PREGNANCY, URINE: Preg Test, Ur: NEGATIVE

## 2017-05-01 LAB — URINALYSIS, ROUTINE W REFLEX MICROSCOPIC
BILIRUBIN URINE: NEGATIVE
GLUCOSE, UA: NEGATIVE mg/dL
KETONES UR: 20 mg/dL — AB
NITRITE: NEGATIVE
PH: 5 (ref 5.0–8.0)
Protein, ur: 30 mg/dL — AB
SPECIFIC GRAVITY, URINE: 1.03 (ref 1.005–1.030)

## 2017-05-01 NOTE — MAU Note (Signed)
Pt presents to MAU with complaints of lightheadedness, lower abdominal pain for two weeks and 4 positive pregnancy test at home

## 2017-05-01 NOTE — MAU Note (Signed)
Pt left after speaking with nurse stating that she was tired of waiting for bed to be evaluated

## 2017-05-08 ENCOUNTER — Inpatient Hospital Stay (HOSPITAL_COMMUNITY)
Admission: AD | Admit: 2017-05-08 | Discharge: 2017-05-08 | Discharge status: Against Medical Advice | Payer: Medicaid Other | Source: Ambulatory Visit | Attending: Obstetrics & Gynecology | Admitting: Obstetrics & Gynecology

## 2017-05-08 DIAGNOSIS — Z8249 Family history of ischemic heart disease and other diseases of the circulatory system: Secondary | ICD-10-CM | POA: Diagnosis not present

## 2017-05-08 DIAGNOSIS — R4589 Other symptoms and signs involving emotional state: Secondary | ICD-10-CM

## 2017-05-08 DIAGNOSIS — R109 Unspecified abdominal pain: Secondary | ICD-10-CM | POA: Diagnosis not present

## 2017-05-08 DIAGNOSIS — Z3202 Encounter for pregnancy test, result negative: Secondary | ICD-10-CM

## 2017-05-08 DIAGNOSIS — R42 Dizziness and giddiness: Secondary | ICD-10-CM | POA: Diagnosis present

## 2017-05-08 DIAGNOSIS — R4689 Other symptoms and signs involving appearance and behavior: Secondary | ICD-10-CM

## 2017-05-08 LAB — URINALYSIS, ROUTINE W REFLEX MICROSCOPIC
Bilirubin Urine: NEGATIVE
Glucose, UA: NEGATIVE mg/dL
Hgb urine dipstick: NEGATIVE
Ketones, ur: 5 mg/dL — AB
Nitrite: NEGATIVE
Protein, ur: NEGATIVE mg/dL
Specific Gravity, Urine: 1.025 (ref 1.005–1.030)
pH: 5 (ref 5.0–8.0)

## 2017-05-08 LAB — HCG, SERUM, QUALITATIVE: Preg, Serum: NEGATIVE

## 2017-05-08 LAB — PREGNANCY, URINE: Preg Test, Ur: NEGATIVE

## 2017-05-08 LAB — POCT PREGNANCY, URINE: Preg Test, Ur: NEGATIVE

## 2017-05-08 NOTE — MAU Note (Signed)
Pt presents with c/o lower abdominal pain that started approximately 1 week ago.  Pt describes pain as "turning".  Pt also report dizziness for a "couple days".  Denies VB.  Pt reports +UPT in MAU last week, no results found in Epic, will repeat.

## 2017-05-08 NOTE — MAU Provider Note (Signed)
History     CSN: [id number]  Arrival date and time: 05/08/17 1519   None     Chief Complaint  Patient presents with  . Abdominal Pain  . Dizziness   HPI   [name] is a 26 y.o. female [id number] non-pregnant female here in MAU with abdominal pain and dizziness. Symptoms have been present for 1 week. She has not seen any other provider for this pain. The pain comes and goes. LMP unknown. No new sexual partners. Last intercourse was in June. Does not want STI testing today. States she had 4 clear blue pregnancy tests that showed positive. States she feels the baby move.   OB History    Gravida Para Term Preterm AB Living   4 3 3  0 0 3   SAB TAB Ectopic Multiple Live Births   0 0 0 0 3      Past Medical History:  Diagnosis Date  . Pregnancy induced hypertension 2011    Past Surgical History:  Procedure Laterality Date  . CESAREAN SECTION  10/14/2011   Procedure: CESAREAN SECTION;  Surgeon: Kathreen Cosier, MD;  Location: [facility];  Service: Gynecology;  Laterality: N/A;  Primary Cesarean Section Delivery Baby Boy @ 0001, Apgars 8/9  . WISDOM TOOTH EXTRACTION  2010    Family History  Problem Relation Age of Onset  . Hypertension Mother   . Hypertension Father     Social History  Substance Use Topics  . Smoking status: Never Smoker  . Smokeless tobacco: Never Used  . Alcohol use No    Allergies: No Known Allergies  Prescriptions Prior to Admission  Medication Sig Dispense Refill Last Dose  . albuterol (PROVENTIL HFA;VENTOLIN HFA) 108 (90 Base) MCG/ACT inhaler Inhale 1-2 puffs into the lungs every 6 (six) hours as needed for wheezing or shortness of breath.   05/07/2017 at Unknown time  . potassium chloride SA (K-DUR,KLOR-CON) 20 MEQ tablet Take 1 tablet (20 mEq total) by mouth 2 (two) times daily. (Patient not taking: Reported on 05/08/2017) 4 tablet 0 Not Taking at Unknown time  . promethazine (PHENERGAN) 25 MG tablet Take 1 tablet (25 mg total) by mouth every 6  (six) hours as needed for nausea or vomiting. (Patient not taking: Reported on 05/08/2017) 30 tablet 0 Not Taking at Unknown time   Results for orders placed or performed during the hospital encounter of 05/08/17 (from the past 48 hour(s))  Urinalysis, Routine w reflex microscopic     Status: Abnormal   Collection Time: 05/08/17  3:29 PM  Result Value Ref Range   Color, Urine YELLOW YELLOW   APPearance HAZY (A) CLEAR   Specific Gravity, Urine 1.025 1.005 - 1.030   pH 5.0 5.0 - 8.0   Glucose, UA NEGATIVE NEGATIVE mg/dL   Hgb urine dipstick NEGATIVE NEGATIVE   Bilirubin Urine NEGATIVE NEGATIVE   Ketones, ur 5 (A) NEGATIVE mg/dL   Protein, ur NEGATIVE NEGATIVE mg/dL   Nitrite NEGATIVE NEGATIVE   Leukocytes, UA MODERATE (A) NEGATIVE   RBC / HPF 0-5 0 - 5 RBC/hpf   WBC, UA 6-30 0 - 5 WBC/hpf   Bacteria, UA RARE (A) NONE SEEN   Squamous Epithelial / LPF 0-5 (A) NONE SEEN   Mucus PRESENT   Pregnancy, urine     Status: None   Collection Time: 05/08/17  3:29 PM  Result Value Ref Range   Preg Test, Ur NEGATIVE NEGATIVE    Comment:        THE SENSITIVITY  OF THIS METHODOLOGY IS >20 mIU/mL.   hCG, serum, qualitative     Status: None   Collection Time: 05/08/17  3:53 PM  Result Value Ref Range   Preg, Serum NEGATIVE NEGATIVE  Pregnancy, urine POC     Status: None   Collection Time: 05/08/17  3:59 PM  Result Value Ref Range   Preg Test, Ur NEGATIVE NEGATIVE    Comment:        THE SENSITIVITY OF THIS METHODOLOGY IS >24 mIU/mL     Review of Systems  Constitutional: Negative for fever.  Gastrointestinal: Positive for nausea. Negative for vomiting.  Genitourinary: Negative for dysuria.   Physical Exam   Blood pressure 125/83, pulse 83, temperature 98.9 F (37.2 C), temperature source Oral, resp. rate 20, height 5\' 4"  (1.626 m), weight 149 lb 14.4 oz (68 kg), SpO2 100 %, not currently breastfeeding.  Physical Exam  Psychiatric: Her mood appears anxious. Her affect is angry, blunt  and inappropriate. Her speech is rapid and/or pressured. She is agitated and aggressive. Thought content is delusional. She expresses impulsivity and inappropriate judgment.   MAU Course  Procedures  None  MDM  Urine pregnancy test negative Hcg test negative Patient declined physical exam, sitting on the bed fully dressed.   Patient was seen in the MAU on 4/10 for abdominal pain and reports she had several positive pregnancy tests at home; HCG was recommended however the patient left AMA.  Patient also presented on 8/24 with abdominal pain and reports 4 positive pregnancy tests, and MAU test was negative. Patient did not wait to be seen.  Patient belligerent in MAU today; using profanity and stating things like "If [name] come back with a baby in 3 months [name] will leave the baby for you to care for" "[name] know that [name] am pregnant, [name] can feel the sh** move".  Security called by RN staff due to patients escalating anger in the hall of MAU. Patient asked multiple times to lower her voice and come back in the room to discuss other options and treatment plans. Patient continued with aggressive behavior.  Security escorted patient out of MAU. Security notified of concern for previous MAU visits reporting pregnancy.    Assessment and Plan    Patient angry and left AMA   , Nicole RutherfordJennifer I, NP 05/08/2017 5:38 PM

## 2017-05-08 NOTE — Progress Notes (Addendum)
Nicole EastJ. Rasch, NP @ bediside discussing both urine and blood tests results. Pt informed both tests negative indicating she's not currently pregnant.  Pt became increasingly agitated & belligerent with increasing tone of voice and utilizing profanity.  Pt instructed she may continue with current visit or seek care @ another facility.  Pt refused to receive further care but wouldn't leave facility.  Security notified and pt escorted off unit AMA.

## 2017-06-12 ENCOUNTER — Emergency Department (HOSPITAL_COMMUNITY)
Admission: EM | Admit: 2017-06-12 | Discharge: 2017-06-13 | Disposition: A | Discharge status: Home / Self Care | Payer: Medicaid Other | Attending: Emergency Medicine | Admitting: Emergency Medicine

## 2017-06-12 ENCOUNTER — Encounter (HOSPITAL_COMMUNITY): Payer: Self-pay | Admitting: Emergency Medicine

## 2017-06-12 DIAGNOSIS — D6489 Other specified anemias: Secondary | ICD-10-CM | POA: Diagnosis not present

## 2017-06-12 DIAGNOSIS — F17290 Nicotine dependence, other tobacco product, uncomplicated: Secondary | ICD-10-CM | POA: Insufficient documentation

## 2017-06-12 DIAGNOSIS — R112 Nausea with vomiting, unspecified: Secondary | ICD-10-CM | POA: Diagnosis present

## 2017-06-12 DIAGNOSIS — D509 Iron deficiency anemia, unspecified: Secondary | ICD-10-CM

## 2017-06-12 DIAGNOSIS — E876 Hypokalemia: Secondary | ICD-10-CM | POA: Diagnosis not present

## 2017-06-12 LAB — URINALYSIS, ROUTINE W REFLEX MICROSCOPIC
Bilirubin Urine: NEGATIVE
Glucose, UA: NEGATIVE mg/dL
Hgb urine dipstick: NEGATIVE
KETONES UR: NEGATIVE mg/dL
Nitrite: NEGATIVE
Protein, ur: NEGATIVE mg/dL
SPECIFIC GRAVITY, URINE: 1.011 (ref 1.005–1.030)
pH: 5 (ref 5.0–8.0)

## 2017-06-12 LAB — COMPREHENSIVE METABOLIC PANEL
ALBUMIN: 3.9 g/dL (ref 3.5–5.0)
ALT: 17 U/L (ref 14–54)
AST: 30 U/L (ref 15–41)
Alkaline Phosphatase: 54 U/L (ref 38–126)
Anion gap: 9 (ref 5–15)
BILIRUBIN TOTAL: 0.5 mg/dL (ref 0.3–1.2)
BUN: 8 mg/dL (ref 6–20)
CHLORIDE: 106 mmol/L (ref 101–111)
CO2: 23 mmol/L (ref 22–32)
Calcium: 8.8 mg/dL — ABNORMAL LOW (ref 8.9–10.3)
Creatinine, Ser: 0.72 mg/dL (ref 0.44–1.00)
GFR calc Af Amer: >60 mL/min (ref >60)
GFR calc non Af Amer: >60 mL/min (ref >60)
GLUCOSE: 89 mg/dL (ref 65–99)
POTASSIUM: 3.2 mmol/L — AB (ref 3.5–5.1)
SODIUM: 138 mmol/L (ref 135–145)
Total Protein: 7.1 g/dL (ref 6.5–8.1)

## 2017-06-12 LAB — CBC
HEMATOCRIT: 27.6 % — AB (ref 36.0–46.0)
HEMOGLOBIN: 8.8 g/dL — AB (ref 12.0–15.0)
MCH: 24.4 pg — AB (ref 26.0–34.0)
MCHC: 31.9 g/dL (ref 30.0–36.0)
MCV: 76.5 fL — AB (ref 78.0–100.0)
Platelets: 371 10*3/uL (ref 150–400)
RBC: 3.61 MIL/uL — ABNORMAL LOW (ref 3.87–5.11)
RDW: 16.4 % — AB (ref 11.5–15.5)
WBC: 6 10*3/uL (ref 4.0–10.5)

## 2017-06-12 LAB — POC URINE PREG, ED: Preg Test, Ur: NEGATIVE

## 2017-06-12 LAB — LIPASE, BLOOD: LIPASE: 26 U/L (ref 11–51)

## 2017-06-12 MED ORDER — ONDANSETRON HCL 4 MG/2ML IJ SOLN
4.0000 mg | Freq: Once | INTRAMUSCULAR | Status: AC
  Administered 2017-06-13: 4 mg via INTRAVENOUS
  Filled 2017-06-12: qty 2

## 2017-06-12 MED ORDER — SODIUM CHLORIDE 0.9 % IV BOLUS (SEPSIS)
1000.0000 mL | Freq: Once | INTRAVENOUS | Status: AC
  Administered 2017-06-13: 1000 mL via INTRAVENOUS

## 2017-06-12 MED ORDER — POTASSIUM CHLORIDE CRYS ER 20 MEQ PO TBCR
40.0000 meq | EXTENDED_RELEASE_TABLET | Freq: Once | ORAL | Status: AC
  Administered 2017-06-13: 40 meq via ORAL
  Filled 2017-06-12: qty 2

## 2017-06-12 NOTE — ED Provider Notes (Signed)
[facility]PT Provider Note   CSN: [id number] Arrival date & time: 06/12/17  2152     History   Chief Complaint Chief Complaint  Patient presents with  . Abdominal Pain    HPI Nicole [name] is a 26 y.o. female.  The history is provided by the patient.  She complains of nausea and vomiting for the last 5 days. During that time, she has been able to drink water, but everything else that she eats will make her vomit and it comes right back up. She has been having normal bowel movements without diarrhea. She denies fever, chills, sweats. There has been some crampy lower abdominal pain which he rates at 7/10. Pain is better after vomiting. She denies any sick contacts. She has not taken anything at home to treat this.  Past Medical History:  Diagnosis Date  . Pregnancy induced hypertension 2011    Patient Active Problem List   Diagnosis Date Noted  . Active labor 01/04/2014  . NVD (normal vaginal delivery) 01/04/2014  . Cesarean delivery, without mention of indication, delivered, with or without mention of antepartum condition 10/18/2011    Past Surgical History:  Procedure Laterality Date  . CESAREAN SECTION  10/14/2011   Procedure: CESAREAN SECTION;  Surgeon: Kathreen Cosier, MD;  Location: [facility];  Service: Gynecology;  Laterality: N/A;  Primary Cesarean Section Delivery Baby Boy @ 0001, Apgars 8/9  . WISDOM TOOTH EXTRACTION  2010    OB History    Gravida Para Term Preterm AB Living   0 0 3   SAB TAB Ectopic Multiple Live Births   0 0 0 0 3       Home Medications    Prior to Admission medications   Medication Sig Start Date End Date Taking? Authorizing Provider  albuterol (PROVENTIL HFA;VENTOLIN HFA) 108 (90 Base) MCG/ACT inhaler Inhale 1-2 puffs into the lungs every 6 (six) hours as needed for wheezing or shortness of breath.    [provider]  potassium chloride SA (K-DUR,KLOR-CON) 20 MEQ tablet Take 1 tablet (20 mEq total) by mouth 2 (two)  times daily. Patient not taking: Reported on 05/08/2017 10/15/16   [name], NP  promethazine (PHENERGAN) 25 MG tablet Take 1 tablet (25 mg total) by mouth every 6 (six) hours as needed for nausea or vomiting. Patient not taking: Reported on 05/08/2017 10/15/16   [name], NP    Family History Family History  Problem Relation Age of Onset  . Hypertension Mother   . Hypertension Father     Social History Social History  Substance Use Topics  . Smoking status: Current Some Day Smoker    Types: Cigars  . Smokeless tobacco: Never Used  . Alcohol use No     Allergies   Patient has no known allergies.   Review of Systems Review of Systems  All other systems reviewed and are negative.    Physical Exam Updated Vital Signs BP (!) 141/99 (BP Location: Right Arm)   Pulse 75   Temp 98.2 F (36.8 C) (Oral)   Resp 16   Ht  (1.651 m)   Wt 67.6 kg (149 lb)   LMP 06/08/2017   SpO2 100%   BMI 24.79 kg/m   Physical Exam  Nursing note and vitals reviewed.  26 year old female, resting comfortably and in no acute distress. Vital signs are significant for hypertension. Oxygen saturation is 100%, which is normal. Head is normocephalic and atraumatic. PERRLA, EOMI. Oropharynx  is clear. Neck is nontender and supple without adenopathy or JVD. Back is nontender and there is no CVA tenderness. Lungs are clear without rales, wheezes, or rhonchi. Chest is nontender. Heart has regular rate and rhythm without murmur. Abdomen is soft, flat, nontender without masses or hepatosplenomegaly and peristalsis is hypoactive. Extremities have no cyanosis or edema, full range of motion is present. Skin is warm and dry without rash. Neurologic: Mental status is normal, cranial nerves are intact, there are no motor or sensory deficits.  ED Treatments / Results  Labs (all labs ordered are listed, but only abnormal results are displayed) Labs Reviewed  COMPREHENSIVE METABOLIC PANEL -  Abnormal; Notable for the following:       Result Value   Potassium 3.2 (*)    Calcium 8.8 (*)    All other components within normal limits  CBC - Abnormal; Notable for the following:    RBC 3.61 (*)    Hemoglobin 8.8 (*)    HCT 27.6 (*)    MCV 76.5 (*)    MCH 24.4 (*)    RDW 16.4 (*)    All other components within normal limits  URINALYSIS, ROUTINE W REFLEX MICROSCOPIC - Abnormal; Notable for the following:    Leukocytes, UA TRACE (*)    Bacteria, UA RARE (*)    Squamous Epithelial / LPF 0-5 (*)    All other components within normal limits  LIPASE, BLOOD  POC URINE PREG, ED    Procedures Procedures (including critical care time)  Medications Ordered in ED Medications - No data to display   Initial Impression / Assessment and Plan / ED Course  I have reviewed the triage vital signs and the nursing notes.  Pertinent labs & imaging results that were available during my care of the patient were reviewed by me and considered in my medical decision making (see chart for details).  Nausea and vomiting of uncertain cause. No sign of serious pathology. Laboratory workup significant for stable anemia and hypokalemia. Review of old records shows anemia present dating back to 2011, microcytic. She does admit to heavy menses.she has had prior ED visits for abdominal pain and vomiting, and had negative CT of abdomen and pelvis in April 2017. No indication for imaging today. She is given IV fluids, ondansetron, and oral potassium.  She feels much better after above noted treatment. She is discharged with prescription for ondansetron, K-Dur, and ferrous sulfate. Follow-up with PCP next week.  Final Clinical Impressions(s) / ED Diagnoses   Final diagnoses:  Non-intractable vomiting with nausea, unspecified vomiting type  Microcytic anemia  Hypokalemia    New Prescriptions New Prescriptions   FERROUS SULFATE 325 (65 FE) MG TABLET    Take 1 tablet (325 mg total) by mouth daily.    ONDANSETRON (ZOFRAN) 4 MG TABLET    Take 1 tablet (4 mg total) by mouth every 6 (six) hours as needed for nausea or vomiting.     Dione Booze, MD 06/13/17 [phone]

## 2017-06-12 NOTE — ED Triage Notes (Signed)
Reports LLQ abdominal cramping on and off for a month.  Worse this week.  Also reporting nausea and vomiting.

## 2017-06-13 MED ORDER — FERROUS SULFATE 325 (65 FE) MG PO TABS: 325.0000 mg | ORAL_TABLET | Freq: Every day | ORAL | 0 refills | Status: DC

## 2017-06-13 MED ORDER — POTASSIUM CHLORIDE CRYS ER 20 MEQ PO TBCR: 20.0000 meq | EXTENDED_RELEASE_TABLET | Freq: Two times a day (BID) | ORAL | 0 refills | Status: DC

## 2017-06-13 MED ORDER — ONDANSETRON HCL 4 MG PO TABS: 4.0000 mg | ORAL_TABLET | Freq: Four times a day (QID) | ORAL | 0 refills | Status: DC | PRN

## 2017-06-16 ENCOUNTER — Encounter (HOSPITAL_COMMUNITY): Payer: Self-pay | Admitting: Emergency Medicine

## 2017-06-16 ENCOUNTER — Emergency Department (HOSPITAL_COMMUNITY)
Admission: EM | Admit: 2017-06-16 | Discharge: 2017-06-16 | Discharge status: Against Medical Advice | Payer: Medicaid Other | Attending: Emergency Medicine | Admitting: Emergency Medicine

## 2017-06-16 DIAGNOSIS — R109 Unspecified abdominal pain: Secondary | ICD-10-CM | POA: Diagnosis not present

## 2017-06-16 LAB — URINALYSIS, ROUTINE W REFLEX MICROSCOPIC
Bilirubin Urine: NEGATIVE
GLUCOSE, UA: NEGATIVE mg/dL
Ketones, ur: NEGATIVE mg/dL
NITRITE: NEGATIVE
PH: 5 (ref 5.0–8.0)
Protein, ur: 30 mg/dL — AB
Specific Gravity, Urine: 1.024 (ref 1.005–1.030)

## 2017-06-16 LAB — CBC WITH DIFFERENTIAL/PLATELET
BASOS PCT: 0 %
Basophils Absolute: 0 10*3/uL (ref 0.0–0.1)
EOS ABS: 0.3 10*3/uL (ref 0.0–0.7)
EOS PCT: 5 %
HCT: 28.9 % — ABNORMAL LOW (ref 36.0–46.0)
HEMOGLOBIN: 9.1 g/dL — AB (ref 12.0–15.0)
LYMPHS ABS: 2 10*3/uL (ref 0.7–4.0)
Lymphocytes Relative: 33 %
MCH: 24.3 pg — AB (ref 26.0–34.0)
MCHC: 31.5 g/dL (ref 30.0–36.0)
MCV: 77.1 fL — ABNORMAL LOW (ref 78.0–100.0)
MONOS PCT: 8 %
Monocytes Absolute: 0.5 10*3/uL (ref 0.1–1.0)
NEUTROS ABS: 3.2 10*3/uL (ref 1.7–7.7)
NEUTROS PCT: 54 %
PLATELETS: 374 10*3/uL (ref 150–400)
RBC: 3.75 MIL/uL — AB (ref 3.87–5.11)
RDW: 16.9 % — ABNORMAL HIGH (ref 11.5–15.5)
WBC: 6 10*3/uL (ref 4.0–10.5)

## 2017-06-16 LAB — I-STAT CHEM 8, ED
BUN: 12 mg/dL (ref 6–20)
CALCIUM ION: 1.16 mmol/L (ref 1.15–1.40)
CHLORIDE: 106 mmol/L (ref 101–111)
Creatinine, Ser: 0.7 mg/dL (ref 0.44–1.00)
Glucose, Bld: 114 mg/dL — ABNORMAL HIGH (ref 65–99)
HEMATOCRIT: 31 % — AB (ref 36.0–46.0)
Hemoglobin: 10.5 g/dL — ABNORMAL LOW (ref 12.0–15.0)
Potassium: 3.1 mmol/L — ABNORMAL LOW (ref 3.5–5.1)
SODIUM: 144 mmol/L (ref 135–145)
TCO2: 25 mmol/L (ref 22–32)

## 2017-06-16 LAB — POC URINE PREG, ED: Preg Test, Ur: NEGATIVE

## 2017-06-16 NOTE — ED Notes (Signed)
Patient up to [facility] asking about wait time, that she has seen a lot of people go back, and they came in after she did. I explained "Fast Track" and that there were just a few people in front of her. Patient remained upset about other people going back. She hurriedly walked out of the front door. Possible LWBS.

## 2017-06-16 NOTE — ED Triage Notes (Signed)
Pt c/o abd pain for approx 1 month.  St's for past week she has felt something moving in her lower abd.  Pt was seen here for same on 10/5 and st's she is not any better

## 2017-06-17 ENCOUNTER — Emergency Department (HOSPITAL_COMMUNITY): Payer: Medicaid Other

## 2017-06-17 ENCOUNTER — Encounter (HOSPITAL_COMMUNITY): Payer: Self-pay

## 2017-06-17 ENCOUNTER — Emergency Department (HOSPITAL_COMMUNITY)
Admission: EM | Admit: 2017-06-17 | Discharge: 2017-06-18 | Disposition: A | Discharge status: Home / Self Care | Payer: Medicaid Other | Source: Home / Self Care | Attending: Emergency Medicine | Admitting: Emergency Medicine

## 2017-06-17 ENCOUNTER — Emergency Department (HOSPITAL_COMMUNITY)
Admission: EM | Admit: 2017-06-17 | Discharge: 2017-06-17 | Disposition: A | Discharge status: Home / Self Care | Payer: Medicaid Other | Attending: Emergency Medicine | Admitting: Emergency Medicine

## 2017-06-17 ENCOUNTER — Encounter (HOSPITAL_COMMUNITY): Payer: Self-pay | Admitting: Emergency Medicine

## 2017-06-17 DIAGNOSIS — N3000 Acute cystitis without hematuria: Secondary | ICD-10-CM

## 2017-06-17 DIAGNOSIS — R112 Nausea with vomiting, unspecified: Secondary | ICD-10-CM | POA: Insufficient documentation

## 2017-06-17 DIAGNOSIS — F17210 Nicotine dependence, cigarettes, uncomplicated: Secondary | ICD-10-CM | POA: Diagnosis not present

## 2017-06-17 DIAGNOSIS — F458 Other somatoform disorders: Secondary | ICD-10-CM

## 2017-06-17 DIAGNOSIS — R1030 Lower abdominal pain, unspecified: Secondary | ICD-10-CM

## 2017-06-17 DIAGNOSIS — Z3202 Encounter for pregnancy test, result negative: Secondary | ICD-10-CM | POA: Insufficient documentation

## 2017-06-17 DIAGNOSIS — Z789 Other specified health status: Secondary | ICD-10-CM

## 2017-06-17 LAB — COMPREHENSIVE METABOLIC PANEL
ALBUMIN: 3.9 g/dL (ref 3.5–5.0)
ALK PHOS: 53 U/L (ref 38–126)
ALT: 15 U/L (ref 14–54)
ALT: 17 U/L (ref 14–54)
ANION GAP: 8 (ref 5–15)
AST: 25 U/L (ref 15–41)
AST: 27 U/L (ref 15–41)
Albumin: 3.6 g/dL (ref 3.5–5.0)
Alkaline Phosphatase: 53 U/L (ref 38–126)
Anion gap: 6 (ref 5–15)
BUN: 14 mg/dL (ref 6–20)
BUN: 8 mg/dL (ref 6–20)
CALCIUM: 8.6 mg/dL — AB (ref 8.9–10.3)
CHLORIDE: 103 mmol/L (ref 101–111)
CHLORIDE: 109 mmol/L (ref 101–111)
CO2: 25 mmol/L (ref 22–32)
CO2: 26 mmol/L (ref 22–32)
CREATININE: 0.72 mg/dL (ref 0.44–1.00)
Calcium: 9.1 mg/dL (ref 8.9–10.3)
Creatinine, Ser: 0.71 mg/dL (ref 0.44–1.00)
GFR calc Af Amer: >60 mL/min (ref >60)
GFR calc non Af Amer: >60 mL/min (ref >60)
GLUCOSE: 93 mg/dL (ref 65–99)
Glucose, Bld: 98 mg/dL (ref 65–99)
POTASSIUM: 3.1 mmol/L — AB (ref 3.5–5.1)
Potassium: 3.3 mmol/L — ABNORMAL LOW (ref 3.5–5.1)
SODIUM: 137 mmol/L (ref 135–145)
Sodium: 140 mmol/L (ref 135–145)
TOTAL PROTEIN: 7.2 g/dL (ref 6.5–8.1)
Total Bilirubin: 0.4 mg/dL (ref 0.3–1.2)
Total Bilirubin: 0.8 mg/dL (ref 0.3–1.2)
Total Protein: 6.6 g/dL (ref 6.5–8.1)

## 2017-06-17 LAB — CBC WITH DIFFERENTIAL/PLATELET
BASOS ABS: 0 10*3/uL (ref 0.0–0.1)
Basophils Relative: 0 %
Eosinophils Absolute: 0.2 10*3/uL (ref 0.0–0.7)
Eosinophils Relative: 5 %
HCT: 28.8 % — ABNORMAL LOW (ref 36.0–46.0)
Hemoglobin: 9 g/dL — ABNORMAL LOW (ref 12.0–15.0)
LYMPHS PCT: 42 %
Lymphs Abs: 1.9 10*3/uL (ref 0.7–4.0)
MCH: 24.3 pg — AB (ref 26.0–34.0)
MCHC: 31.3 g/dL (ref 30.0–36.0)
MCV: 77.6 fL — AB (ref 78.0–100.0)
Monocytes Absolute: 0.4 10*3/uL (ref 0.1–1.0)
Monocytes Relative: 10 %
Neutro Abs: 2 10*3/uL (ref 1.7–7.7)
Neutrophils Relative %: 43 %
Platelets: 372 10*3/uL (ref 150–400)
RBC: 3.71 MIL/uL — ABNORMAL LOW (ref 3.87–5.11)
RDW: 16.4 % — AB (ref 11.5–15.5)
WBC: 4.6 10*3/uL (ref 4.0–10.5)

## 2017-06-17 LAB — URINALYSIS, ROUTINE W REFLEX MICROSCOPIC
BILIRUBIN URINE: NEGATIVE
GLUCOSE, UA: NEGATIVE mg/dL
HGB URINE DIPSTICK: NEGATIVE
Ketones, ur: 5 mg/dL — AB
NITRITE: NEGATIVE
PH: 5 (ref 5.0–8.0)
Protein, ur: 30 mg/dL — AB
SPECIFIC GRAVITY, URINE: 1.031 — AB (ref 1.005–1.030)

## 2017-06-17 LAB — I-STAT BETA HCG BLOOD, ED (MC, WL, AP ONLY): I-stat hCG, quantitative: <5 m[IU]/mL (ref <5)

## 2017-06-17 LAB — CBC
HCT: 29.6 % — ABNORMAL LOW (ref 36.0–46.0)
Hemoglobin: 9.1 g/dL — ABNORMAL LOW (ref 12.0–15.0)
MCH: 23.8 pg — AB (ref 26.0–34.0)
MCHC: 30.7 g/dL (ref 30.0–36.0)
MCV: 77.5 fL — AB (ref 78.0–100.0)
PLATELETS: 378 10*3/uL (ref 150–400)
RBC: 3.82 MIL/uL — AB (ref 3.87–5.11)
RDW: 16.6 % — AB (ref 11.5–15.5)
WBC: 5.4 10*3/uL (ref 4.0–10.5)

## 2017-06-17 LAB — RAPID URINE DRUG SCREEN, HOSP PERFORMED
Amphetamines: NOT DETECTED
BARBITURATES: NOT DETECTED
BENZODIAZEPINES: NOT DETECTED
COCAINE: NOT DETECTED
Opiates: NOT DETECTED
Tetrahydrocannabinol: POSITIVE — AB

## 2017-06-17 LAB — LIPASE, BLOOD: LIPASE: 31 U/L (ref 11–51)

## 2017-06-17 LAB — ETHANOL: Alcohol, Ethyl (B): <10 mg/dL (ref <10)

## 2017-06-17 LAB — HCG, SERUM, QUALITATIVE: PREG SERUM: NEGATIVE

## 2017-06-17 LAB — POC URINE PREG, ED: Preg Test, Ur: NEGATIVE

## 2017-06-17 MED ORDER — ONDANSETRON HCL 4 MG PO TABS: 4.0000 mg | ORAL_TABLET | Freq: Three times a day (TID) | ORAL | 0 refills | Status: DC | PRN

## 2017-06-17 MED ORDER — ONDANSETRON HCL 4 MG PO TABS
4.0000 mg | ORAL_TABLET | Freq: Once | ORAL | Status: AC
  Administered 2017-06-17: 4 mg via ORAL
  Filled 2017-06-17: qty 1

## 2017-06-17 MED ORDER — CEPHALEXIN 500 MG PO CAPS: 500.0000 mg | ORAL_CAPSULE | Freq: Two times a day (BID) | ORAL | 0 refills | Status: AC

## 2017-06-17 MED ORDER — CEPHALEXIN 250 MG PO CAPS
500.0000 mg | ORAL_CAPSULE | Freq: Once | ORAL | Status: AC
  Administered 2017-06-17: 500 mg via ORAL
  Filled 2017-06-17: qty 2

## 2017-06-17 NOTE — ED Provider Notes (Signed)
[facility] Provider Note   CSN: [id number] Arrival date & time: 06/17/17  2134     History   Chief Complaint Chief Complaint  Patient presents with  . Abdominal Pain  . Medical Clearance    HPI Nicole [name] is a 26 y.o. female.  HPI  Nicole [name] apparently is a 26 year old female G3 P3 presents today stating that she has been having abdominal discomfort for the past month. She has been seen nd evaluated multiple times.during the evaluation she has had an ultrasound of her pelvis.  She has a stable anemia, mild hypokalemia. She is convinced that she is pregnant and can feel something moving in her abdomen. She states that this is "driving me crazy"  Past Medical History:  Diagnosis Date  . Pregnancy induced hypertension 2011    Patient Active Problem List   Diagnosis Date Noted  . Active labor 01/04/2014  . NVD (normal vaginal delivery) 01/04/2014  . Cesarean delivery, without mention of indication, delivered, with or without mention of antepartum condition 10/18/2011    Past Surgical History:  Procedure Laterality Date  . CESAREAN SECTION  10/14/2011   Procedure: CESAREAN SECTION;  Surgeon: Kathreen Cosier, MD;  Location: [facility];  Service: Gynecology;  Laterality: N/A;  Primary Cesarean Section Delivery Baby Boy @ 0001, Apgars 8/9  . WISDOM TOOTH EXTRACTION  2010    OB History    Gravida Para Term Preterm AB Living   0 0 3   SAB TAB Ectopic Multiple Live Births   0 0 0 0 3       Home Medications    Prior to Admission medications   Medication Sig Start Date End Date Taking? Authorizing Provider  cephALEXin (KEFLEX) 500 MG capsule Take 1 capsule (500 mg total) by mouth 2 (two) times daily. 06/17/17 06/24/17  Tegeler, Canary Brim, MD  ferrous sulfate 325 (65 FE) MG tablet Take 1 tablet (325 mg total) by mouth daily. 06/13/17   Dione Booze, MD  Multiple Vitamin (MULTIVITAMIN WITH MINERALS) TABS tablet Take 1 tablet by mouth daily.    [provider]  ondansetron (ZOFRAN) 4 MG tablet Take 1 tablet (4 mg total) by mouth every 6 (six) hours as needed for nausea or vomiting. 06/13/17   Dione Booze, MD  ondansetron (ZOFRAN) 4 MG tablet Take 1 tablet (4 mg total) by mouth every 8 (eight) hours as needed for nausea or vomiting. 06/17/17   Tegeler, Canary Brim, MD  potassium chloride SA (K-DUR,KLOR-CON) 20 MEQ tablet Take 1 tablet (20 mEq total) by mouth 2 (two) times daily. 06/13/17   Dione Booze, MD    Family History Family History  Problem Relation Age of Onset  . Hypertension Mother   . Hypertension Father     Social History Social History  Substance Use Topics  . Smoking status: Current Some Day Smoker    Types: Cigars  . Smokeless tobacco: Never Used  . Alcohol use No     Allergies   Patient has no known allergies.   Review of Systems Review of Systems  All other systems reviewed and are negative.    Physical Exam Updated Vital Signs BP 128/82 (BP Location: Left Arm)   Pulse 83   Temp 98.8 F (37.1 C) (Oral)   Resp 18   Ht 1.651 m ( )   Wt 65.8 kg (145 lb)   LMP 06/08/2017 (Exact Date)   SpO2 99%   BMI 24.13 kg/m   Physical Exam  Constitutional: She appears well-developed and well-nourished.  HENT:  Head: Normocephalic and atraumatic.  Right Ear: External ear normal.  Left Ear: External ear normal.  Mouth/Throat: Oropharynx is clear and moist.  Eyes: Pupils are equal, round, and reactive to light. EOM are normal.  Neck: Normal range of motion. Neck supple.  Cardiovascular: Normal rate and regular rhythm.   Pulmonary/Chest: Effort normal and breath sounds normal.  Abdominal: Soft. Bowel sounds are normal. She exhibits no distension and no mass. There is no tenderness. There is no guarding.  Neurological: She is alert.  Skin: Skin is warm. Capillary refill takes less than 2 seconds.  Psychiatric: Her behavior is normal. Judgment normal. Her mood appears anxious. Her affect is labile.  Thought content is delusional. Cognition and memory are normal.  Nursing note and vitals reviewed.    ED Treatments / Results  Labs (all labs ordered are listed, but only abnormal results are displayed) Labs Reviewed  COMPREHENSIVE METABOLIC PANEL  ETHANOL  RAPID URINE DRUG SCREEN, HOSP PERFORMED  CBC WITH DIFFERENTIAL/PLATELET  I-STAT BETA HCG BLOOD, ED ([facility], [facility], [facility] ONLY)    EKG  EKG Interpretation None       Radiology US Transvaginal Non-ob  Result Date: 06/17/2017 CLINICAL DATA:  Lower abdominal pain. Negative urine pregnancy test. EXAM: TRANSABDOMINAL AND TRANSVAGINAL ULTRASOUND OF PELVIS DOPPLER ULTRASOUND OF OVARIES TECHNIQUE: Both transabdominal and transvaginal ultrasound examinations of the pelvis were performed. Transabdominal technique was performed for global imaging of the pelvis including uterus, ovaries, adnexal regions, and pelvic cul-de-sac. It was necessary to proceed with endovaginal exam following the transabdominal exam to visualize the endometrium. Color and duplex Doppler ultrasound was utilized to evaluate blood flow to the ovaries. COMPARISON:  None. FINDINGS: Uterus Measurements: 8.4 x 5.9 x 5.2 cm. No fibroids or other mass visualized. Endometrium Thickness: 9 mm which is within normal limits for patient of reproductive age. No focal abnormality visualized. Right ovary Measurements: 4.2 x 3.1 x 2.2 cm. Normal appearance/no adnexal mass. Left ovary Measurements: 3.1 x 3.0 x 1.9 cm. Normal appearance/no adnexal mass. Pulsed Doppler evaluation of both ovaries demonstrates normal low-resistance arterial and venous waveforms. Other findings Trace free fluid is noted which most likely is physiologic. IMPRESSION: No definite abnormality seen in the pelvis. Electronically Signed   By: Lupita Raider, M.D.   On: 06/17/2017 08:58   US Pelvis Complete  Result Date: 06/17/2017 CLINICAL DATA:  Lower abdominal pain. Negative urine pregnancy test. EXAM: TRANSABDOMINAL AND  TRANSVAGINAL ULTRASOUND OF PELVIS DOPPLER ULTRASOUND OF OVARIES TECHNIQUE: Both transabdominal and transvaginal ultrasound examinations of the pelvis were performed. Transabdominal technique was performed for global imaging of the pelvis including uterus, ovaries, adnexal regions, and pelvic cul-de-sac. It was necessary to proceed with endovaginal exam following the transabdominal exam to visualize the endometrium. Color and duplex Doppler ultrasound was utilized to evaluate blood flow to the ovaries. COMPARISON:  None. FINDINGS: Uterus Measurements: 8.4 x 5.9 x 5.2 cm. No fibroids or other mass visualized. Endometrium Thickness: 9 mm which is within normal limits for patient of reproductive age. No focal abnormality visualized. Right ovary Measurements: 4.2 x 3.1 x 2.2 cm. Normal appearance/no adnexal mass. Left ovary Measurements: 3.1 x 3.0 x 1.9 cm. Normal appearance/no adnexal mass. Pulsed Doppler evaluation of both ovaries demonstrates normal low-resistance arterial and venous waveforms. Other findings Trace free fluid is noted which most likely is physiologic. IMPRESSION: No definite abnormality seen in the pelvis. Electronically Signed   By: Lupita Raider, M.D.  On: 06/17/2017 08:58   [place] Art/ven Flow Abd Pelv Doppler  Result Date: 06/17/2017 CLINICAL DATA:  Lower abdominal pain. Negative urine pregnancy test. EXAM: TRANSABDOMINAL AND TRANSVAGINAL ULTRASOUND OF PELVIS DOPPLER ULTRASOUND OF OVARIES TECHNIQUE: Both transabdominal and transvaginal ultrasound examinations of the pelvis were performed. Transabdominal technique was performed for global imaging of the pelvis including uterus, ovaries, adnexal regions, and pelvic cul-de-sac. It was necessary to proceed with endovaginal exam following the transabdominal exam to visualize the endometrium. Color and duplex Doppler ultrasound was utilized to evaluate blood flow to the ovaries. COMPARISON:  None. FINDINGS: Uterus Measurements: 8.4 x 5.9 x 5.2 cm.  No fibroids or other mass visualized. Endometrium Thickness: 9 mm which is within normal limits for patient of reproductive age. No focal abnormality visualized. Right ovary Measurements: 4.2 x 3.1 x 2.2 cm. Normal appearance/no adnexal mass. Left ovary Measurements: 3.1 x 3.0 x 1.9 cm. Normal appearance/no adnexal mass. Pulsed Doppler evaluation of both ovaries demonstrates normal low-resistance arterial and venous waveforms. Other findings Trace free fluid is noted which most likely is physiologic. IMPRESSION: No definite abnormality seen in the pelvis. Electronically Signed   By: Lupita Raider, M.D.   On: 06/17/2017 08:58    Procedures Procedures (including critical care time)  Medications Ordered in ED Medications - No data to display   Initial Impression / Assessment and Plan / ED Course  I have reviewed the triage vital signs and the nursing notes.  Pertinent labs & imaging results that were available during my care of the patient were reviewed by me and considered in my medical decision making (see chart for details).    atient became tearful here after being told that they not appear to be anything abnormal with her labs, exam, or ultrasound. Plan psych clearance and behavioral health tts.  12:33 AM Discussed results of that all appear normal. Patient states that she feels that she can go home. Plan outpatient behavioral health referrals. Final Clinical Impressions(s) / ED Diagnoses   Final diagnoses:  Delusion of pregnancy    New Prescriptions New Prescriptions   No medications on file     Margarita Grizzle, MD 06/18/17 [phone]

## 2017-06-17 NOTE — Discharge Instructions (Signed)
Your laboratory testing and imaging today showed that you are not pregnant. The ultrasound did not show any evidence of pregnancy or other abnormality in your uterus or ovaries. The urinalysis showed concern for urinary tract infection. You received a dose of antibiotics, please take these twice a day for the next week. Please use the nausea medicine to help stay hydrated as it may have contributed to some constipation. Please follow-up with your PCP and her OB/GYN for further management. If any symptoms change or worsen, please return to the nearest emergency department.

## 2017-06-17 NOTE — ED Triage Notes (Signed)
Patient here with abdominal pain, on and off for the last few days.  She states that she had nausea and vomiting earlier with the pain but that has subsided.  Patient still with pain, LMP started on 10/1 and only lasted for one day.

## 2017-06-17 NOTE — ED Notes (Signed)
EDP at bedside  

## 2017-06-17 NOTE — ED Notes (Addendum)
This RN presented to patient bedside to repeat vital signs and prepare for discharge. MD Tegeler has discussed results with patient indicating this is not pregnant. When asked if she would repeat vitals, patient became extremely agitated utilizing profanity. This RN walked out to get patient prescriptions and discharge information and patient left department, when asked to return patient kept walking, called multiple times without response. Pt discharge instructions and prescriptions left at [facility] in the event patient returns.

## 2017-06-17 NOTE — ED Provider Notes (Signed)
[facility]-EMERGENCY DEPT Provider Note   CSN: [id number] Arrival date & time: 06/17/17  0024     History   Chief Complaint Chief Complaint  Patient presents with  . Abdominal Pain    HPI Eleanor Gatliff is a 26 y.o. female.  The history is provided by the patient and medical records. No language interpreter was used.  Abdominal Cramping  This is a recurrent problem. The current episode started more than 2 days ago. The problem occurs constantly. The problem has not changed since onset.Associated symptoms include abdominal pain. Pertinent negatives include no chest pain, no headaches and no shortness of breath. Nothing aggravates the symptoms. Nothing relieves the symptoms. The treatment provided no relief.    Past Medical History:  Diagnosis Date  . Pregnancy induced hypertension 2011    Patient Active Problem List   Diagnosis Date Noted  . Active labor 01/04/2014  . NVD (normal vaginal delivery) 01/04/2014  . Cesarean delivery, without mention of indication, delivered, with or without mention of antepartum condition 10/18/2011    Past Surgical History:  Procedure Laterality Date  . CESAREAN SECTION  10/14/2011   Procedure: CESAREAN SECTION;  Surgeon: Kathreen Cosier, MD;  Location: [facility];  Service: Gynecology;  Laterality: N/A;  Primary Cesarean Section Delivery Baby Boy @ 0001, Apgars 8/9  . WISDOM TOOTH EXTRACTION  2010    OB History    Gravida Para Term Preterm AB Living   0 0 3   SAB TAB Ectopic Multiple Live Births   0 0 0 0 3       Home Medications    Prior to Admission medications   Medication Sig Start Date End Date Taking? Authorizing Provider  ferrous sulfate 325 (65 FE) MG tablet Take 1 tablet (325 mg total) by mouth daily. 06/13/17  Yes Dione Booze, MD  Multiple Vitamin (MULTIVITAMIN WITH MINERALS) TABS tablet Take 1 tablet by mouth daily.   Yes [provider]  ondansetron (ZOFRAN) 4 MG tablet Take 1 tablet (4 mg total) by mouth every 6  (six) hours as needed for nausea or vomiting. 06/13/17  Yes Dione Booze, MD  potassium chloride SA (K-DUR,KLOR-CON) 20 MEQ tablet Take 1 tablet (20 mEq total) by mouth 2 (two) times daily. 06/13/17  Yes Dione Booze, MD    Family History Family History  Problem Relation Age of Onset  . Hypertension Mother   . Hypertension Father     Social History Social History  Substance Use Topics  . Smoking status: Current Some Day Smoker    Types: Cigars  . Smokeless tobacco: Never Used  . Alcohol use No     Allergies   Patient has no known allergies.   Review of Systems Review of Systems  Constitutional: Negative for chills, diaphoresis, fatigue and fever.  HENT: Negative for congestion and rhinorrhea.   Eyes: Negative for visual disturbance.  Respiratory: Negative for cough, chest tightness and shortness of breath.   Cardiovascular: Negative for chest pain and palpitations.  Gastrointestinal: Positive for abdominal pain, nausea and vomiting. Negative for abdominal distention, constipation and diarrhea.  Genitourinary: Negative for decreased urine volume, dysuria, flank pain, vaginal bleeding, vaginal discharge and vaginal pain.  Musculoskeletal: Negative for back pain, neck pain and neck stiffness.  Skin: Negative for rash and wound.  Neurological: Negative for light-headedness and headaches.  Psychiatric/Behavioral: Negative for agitation and confusion.  All other systems reviewed and are negative.    Physical Exam Updated Vital Signs BP 112/75  Pulse 64   Temp 98.1 F (36.7 C) (Oral)   Resp 15   LMP 06/08/2017 (Exact Date)   SpO2 100%   Physical Exam  Constitutional: She is oriented to person, place, and time. She appears well-developed and well-nourished. No distress.  HENT:  Nose: Nose normal.  Mouth/Throat: Oropharynx is clear and moist. No oropharyngeal exudate.  Eyes: Pupils are equal, round, and reactive to light. Conjunctivae and EOM are normal.  Neck: Normal  range of motion.  Cardiovascular: Normal rate and intact distal pulses.   No murmur heard. Pulmonary/Chest: Effort normal and breath sounds normal. No stridor. No respiratory distress. She has no wheezes. She exhibits no tenderness.  Abdominal: Soft. Bowel sounds are normal. She exhibits no distension. There is no tenderness. There is no rebound and no guarding.  Musculoskeletal: She exhibits no tenderness.  Neurological: She is alert and oriented to person, place, and time. No sensory deficit. She exhibits normal muscle tone.  Skin: Capillary refill takes less than 2 seconds. She is not diaphoretic. No erythema.  Psychiatric: She has a normal mood and affect.  Nursing note and vitals reviewed.    ED Treatments / Results  Labs (all labs ordered are listed, but only abnormal results are displayed) Labs Reviewed  COMPREHENSIVE METABOLIC PANEL - Abnormal; Notable for the following:       Result Value   Potassium 3.3 (*)    Calcium 8.6 (*)    All other components within normal limits  CBC - Abnormal; Notable for the following:    RBC 3.82 (*)    Hemoglobin 9.1 (*)    HCT 29.6 (*)    MCV 77.5 (*)    MCH 23.8 (*)    RDW 16.6 (*)    All other components within normal limits  URINALYSIS, ROUTINE W REFLEX MICROSCOPIC - Abnormal; Notable for the following:    APPearance HAZY (*)    Specific Gravity, Urine 1.031 (*)    Ketones, ur 5 (*)    Protein, ur 30 (*)    Leukocytes, UA MODERATE (*)    Bacteria, UA RARE (*)    Squamous Epithelial / LPF 0-5 (*)    All other components within normal limits  LIPASE, BLOOD  HCG, SERUM, QUALITATIVE  POC URINE PREG, ED    EKG  EKG Interpretation None       Radiology US Transvaginal Non-ob  Result Date: 06/17/2017 CLINICAL DATA:  Lower abdominal pain. Negative urine pregnancy test. EXAM: TRANSABDOMINAL AND TRANSVAGINAL ULTRASOUND OF PELVIS DOPPLER ULTRASOUND OF OVARIES TECHNIQUE: Both transabdominal and transvaginal ultrasound examinations  of the pelvis were performed. Transabdominal technique was performed for global imaging of the pelvis including uterus, ovaries, adnexal regions, and pelvic cul-de-sac. It was necessary to proceed with endovaginal exam following the transabdominal exam to visualize the endometrium. Color and duplex Doppler ultrasound was utilized to evaluate blood flow to the ovaries. COMPARISON:  None. FINDINGS: Uterus Measurements: 8.4 x 5.9 x 5.2 cm. No fibroids or other mass visualized. Endometrium Thickness: 9 mm which is within normal limits for patient of reproductive age. No focal abnormality visualized. Right ovary Measurements: 4.2 x 3.1 x 2.2 cm. Normal appearance/no adnexal mass. Left ovary Measurements: 3.1 x 3.0 x 1.9 cm. Normal appearance/no adnexal mass. Pulsed Doppler evaluation of both ovaries demonstrates normal low-resistance arterial and venous waveforms. Other findings Trace free fluid is noted which most likely is physiologic. IMPRESSION: No definite abnormality seen in the pelvis. Electronically Signed   By: Lupita Raider, M.D.  On: 06/17/2017 08:58   US Pelvis Complete  Result Date: 06/17/2017 CLINICAL DATA:  Lower abdominal pain. Negative urine pregnancy test. EXAM: TRANSABDOMINAL AND TRANSVAGINAL ULTRASOUND OF PELVIS DOPPLER ULTRASOUND OF OVARIES TECHNIQUE: Both transabdominal and transvaginal ultrasound examinations of the pelvis were performed. Transabdominal technique was performed for global imaging of the pelvis including uterus, ovaries, adnexal regions, and pelvic cul-de-sac. It was necessary to proceed with endovaginal exam following the transabdominal exam to visualize the endometrium. Color and duplex Doppler ultrasound was utilized to evaluate blood flow to the ovaries. COMPARISON:  None. FINDINGS: Uterus Measurements: 8.4 x 5.9 x 5.2 cm. No fibroids or other mass visualized. Endometrium Thickness: 9 mm which is within normal limits for patient of reproductive age. No focal abnormality  visualized. Right ovary Measurements: 4.2 x 3.1 x 2.2 cm. Normal appearance/no adnexal mass. Left ovary Measurements: 3.1 x 3.0 x 1.9 cm. Normal appearance/no adnexal mass. Pulsed Doppler evaluation of both ovaries demonstrates normal low-resistance arterial and venous waveforms. Other findings Trace free fluid is noted which most likely is physiologic. IMPRESSION: No definite abnormality seen in the pelvis. Electronically Signed   By: Lupita Raider, M.D.   On: 06/17/2017 08:58   [place] Art/ven Flow Abd Pelv Doppler  Result Date: 06/17/2017 CLINICAL DATA:  Lower abdominal pain. Negative urine pregnancy test. EXAM: TRANSABDOMINAL AND TRANSVAGINAL ULTRASOUND OF PELVIS DOPPLER ULTRASOUND OF OVARIES TECHNIQUE: Both transabdominal and transvaginal ultrasound examinations of the pelvis were performed. Transabdominal technique was performed for global imaging of the pelvis including uterus, ovaries, adnexal regions, and pelvic cul-de-sac. It was necessary to proceed with endovaginal exam following the transabdominal exam to visualize the endometrium. Color and duplex Doppler ultrasound was utilized to evaluate blood flow to the ovaries. COMPARISON:  None. FINDINGS: Uterus Measurements: 8.4 x 5.9 x 5.2 cm. No fibroids or other mass visualized. Endometrium Thickness: 9 mm which is within normal limits for patient of reproductive age. No focal abnormality visualized. Right ovary Measurements: 4.2 x 3.1 x 2.2 cm. Normal appearance/no adnexal mass. Left ovary Measurements: 3.1 x 3.0 x 1.9 cm. Normal appearance/no adnexal mass. Pulsed Doppler evaluation of both ovaries demonstrates normal low-resistance arterial and venous waveforms. Other findings Trace free fluid is noted which most likely is physiologic. IMPRESSION: No definite abnormality seen in the pelvis. Electronically Signed   By: Lupita Raider, M.D.   On: 06/17/2017 08:58    Procedures Procedures (including critical care time)  Medications Ordered in  ED Medications  cephALEXin (KEFLEX) capsule 500 mg (500 mg Oral Given 06/17/17 0741)  ondansetron (ZOFRAN) tablet 4 mg (4 mg Oral Given 06/17/17 0741)     Initial Impression / Assessment and Plan / ED Course  I have reviewed the triage vital signs and the nursing notes.  Pertinent labs & imaging results that were available during my care of the patient were reviewed by me and considered in my medical decision making (see chart for details).     Bell Carbo is a 26 y.o. female with no significant past medical history who presents with abdominal pain, nausea, vomiting, and constipation. Patient said that she has had abdominal pain for the last few days that is intermittent. She describes it as cramping and "moving". She says she's had nausea and vomiting that is mild. She says her last cycle was 10 days ago and lasted 1 day. Patient is concerned she is pregnant. Chart review shows that patient has had multiple negative pregnancy tests over the last few days however patient insists that "  something is moving inside me". She is convinced that she is pregnant.  Patient does report some urinary frequency but denies dysuria. She denies diarrhea but reports mild constipation. Her last BM was yesterday and was nonbloody. She denies any vaginal discharge and vaginal bleeding or pelvic pain. She denies any respiratory symptoms. She denies chest pain or palpitations. She describes her pain as moderate to severe across her abdomen.  On exam, patient is very mild tenderness in her superior pubic area. No flank tenderness. No back tenderness. No CVA tenderness. Lungs clear. Overall abdomen is minimally tender.  Patient deferred GU exam but is still convinced she is pregnant. Urine burns test was negative on arrival. Patient had laboratory testing showing concern for urinary tract infection in the setting frequency.  Patient will have a blood pregnancy test obtained as well as a pelvic ultrasound to assure  patient that she is not pregnant and rule out other causes of her abdominal discomfort. Lipase not elevated. LFTs not elevated. Next  Patient was treated with nausea medicine and antibiotics for UTI. Anticipate discharge with reassurances after workup completed.     10:06 AM Patient's serum pregnancy test was negative. This confirmed she is not pregnant. Ultrasound was also performed showing no evidence of pregnancy or other abnormality. Extensive reassurances were given to the patient. Patient appeared to be convinced she is not pregnant at this time.  Patient will be given prescription for antibiotics as urinalysis showed concern for UTI. Patient also be given pressure for Zofran for nausea so she can stay hydrated that may help with her constipation troubles. Patient voiced understanding of the plan of care. Patient instructed to follow-up with PCP and OB/GYN for further management. Return precautions were given and understood. Patient discharged in good condition.   10:09 AM After presenting discharge information, nursing reports that patient was furious and yelled at the nursing team. She said that this was "Bullshit!" and she still thinks she is pregnant. Patient left without receiving discharge information. We'll hold her prescription and paperwork in the pod if patient decides to return to collected.   Final Clinical Impressions(s) / ED Diagnoses   Final diagnoses:  Lower abdominal pain  Acute cystitis without hematuria  Not currently pregnant    New Prescriptions New Prescriptions   CEPHALEXIN (KEFLEX) 500 MG CAPSULE    Take 1 capsule (500 mg total) by mouth 2 (two) times daily.   ONDANSETRON (ZOFRAN) 4 MG TABLET    Take 1 tablet (4 mg total) by mouth every 8 (eight) hours as needed for nausea or vomiting.    Clinical Impression: 1. Lower abdominal pain   2. Acute cystitis without hematuria   3. Not currently pregnant     Disposition: Discharge  Condition: Good  I have  discussed the results, Dx and Tx plan with the pt(& family if present). He/she/they expressed understanding and agree(s) with the plan. Discharge instructions discussed at great length. Strict return precautions discussed and pt &/or family have verbalized understanding of the instructions. No further questions at time of discharge.    New Prescriptions   CEPHALEXIN (KEFLEX) 500 MG CAPSULE    Take 1 capsule (500 mg total) by mouth 2 (two) times daily.   ONDANSETRON (ZOFRAN) 4 MG TABLET    Take 1 tablet (4 mg total) by mouth every 8 (eight) hours as needed for nausea or vomiting.    Follow Up: Fleet Contras, MD [address] [place] [address] [phone]  Schedule an appointment as soon  as possible for a visit    [facility] [address] [place] [address] [phone] Schedule an appointment as soon as possible for a visit    [facility] EMERGENCY DEPARTMENT [address] [id number] [facility] [place] [address] [phone]  If symptoms worsen     Alivya Wegman, Canary Brim, MD 06/17/17 1600

## 2017-06-17 NOTE — ED Triage Notes (Addendum)
Unable to keep down solid food x several weeks. Pt states she keeps feeling something moving in abd x several days and states it is jumping. PT has been seen multiple times this week with negative preg test, ct abd, and [place]. Dr. Rosalia Hammers in triage. Pt states "its runnin me crazy"

## 2017-06-17 NOTE — ED Notes (Signed)
Pt reports gen abd pain present X several weeks, states it is worse on the L side and she feels something "moving" on the R side. Pt also reports N/V.   After looking through previous notes, pt was seen at [facility] at end of August where she believed she was pregnant b/c she took 4 at home tests that were positive and "she could feel something moving" Pt had pregnancy test that was negative at [facility] and her pregnancy test today is negative.

## 2017-06-18 NOTE — ED Provider Notes (Signed)
2:00 AM  Pt is a 22 are old female who was evaluated by Dr. Rosalia Hammers. Patient here because she is convinced that she is pregnant. She has had multiple negative pregnancy test. Patient screaming at staff because she is convinced she is pregnant. She refuses to leave the room. She will be escorted out of the emergency department by security for staff safety. I do not feel this time she needs for the emergent workup. She is hemodynamically stable with normal labs.   [name], [name], DO 06/18/17 0201

## 2017-06-18 NOTE — BH Assessment (Signed)
[facility] Assessment Progress Note    Called to assess patient, RN currently tied up with a trauma patient.

## 2017-06-18 NOTE — BH Assessment (Signed)
[facility] Assessment Progress Note  Patient D/C from ED with outpatient follow-up prior to TTS being able to complete the assessment.

## 2017-06-27 ENCOUNTER — Encounter (HOSPITAL_COMMUNITY): Payer: Self-pay

## 2017-06-27 ENCOUNTER — Emergency Department (HOSPITAL_COMMUNITY)
Admission: EM | Admit: 2017-06-27 | Discharge: 2017-06-27 | Disposition: A | Discharge status: Home / Self Care | Payer: Medicaid Other | Attending: Emergency Medicine | Admitting: Emergency Medicine

## 2017-06-27 DIAGNOSIS — Z5321 Procedure and treatment not carried out due to patient leaving prior to being seen by health care provider: Secondary | ICD-10-CM | POA: Insufficient documentation

## 2017-06-27 DIAGNOSIS — R51 Headache: Secondary | ICD-10-CM | POA: Diagnosis present

## 2017-06-27 NOTE — ED Notes (Signed)
Have called pt multiple times with no answer in lobby 

## 2017-06-27 NOTE — ED Triage Notes (Signed)
Patient complains of migraine x 1 day with nausea, alert and oriented, NAD

## 2017-06-27 NOTE — ED Notes (Signed)
Called pt x3 for room, no response. 

## 2017-09-15 ENCOUNTER — Encounter (HOSPITAL_COMMUNITY): Payer: Self-pay | Admitting: Emergency Medicine

## 2017-09-15 ENCOUNTER — Emergency Department (HOSPITAL_COMMUNITY)
Admission: EM | Admit: 2017-09-15 | Discharge: 2017-09-15 | Disposition: A | Discharge status: Home / Self Care | Payer: Medicaid Other | Attending: Emergency Medicine | Admitting: Emergency Medicine

## 2017-09-15 DIAGNOSIS — H53149 Visual discomfort, unspecified: Secondary | ICD-10-CM | POA: Diagnosis not present

## 2017-09-15 DIAGNOSIS — R1084 Generalized abdominal pain: Secondary | ICD-10-CM | POA: Insufficient documentation

## 2017-09-15 DIAGNOSIS — R51 Headache: Secondary | ICD-10-CM | POA: Insufficient documentation

## 2017-09-15 DIAGNOSIS — F17290 Nicotine dependence, other tobacco product, uncomplicated: Secondary | ICD-10-CM | POA: Diagnosis not present

## 2017-09-15 DIAGNOSIS — R109 Unspecified abdominal pain: Secondary | ICD-10-CM

## 2017-09-15 DIAGNOSIS — R519 Headache, unspecified: Secondary | ICD-10-CM

## 2017-09-15 DIAGNOSIS — R110 Nausea: Secondary | ICD-10-CM | POA: Insufficient documentation

## 2017-09-15 LAB — CBC
HEMATOCRIT: 30.5 % — AB (ref 36.0–46.0)
HEMOGLOBIN: 9.6 g/dL — AB (ref 12.0–15.0)
MCH: 25.3 pg — ABNORMAL LOW (ref 26.0–34.0)
MCHC: 31.5 g/dL (ref 30.0–36.0)
MCV: 80.5 fL (ref 78.0–100.0)
Platelets: 365 10*3/uL (ref 150–400)
RBC: 3.79 MIL/uL — ABNORMAL LOW (ref 3.87–5.11)
RDW: 16.4 % — ABNORMAL HIGH (ref 11.5–15.5)
WBC: 4.7 10*3/uL (ref 4.0–10.5)

## 2017-09-15 LAB — COMPREHENSIVE METABOLIC PANEL
ALBUMIN: 4 g/dL (ref 3.5–5.0)
ALT: 9 U/L — ABNORMAL LOW (ref 14–54)
ANION GAP: 5 (ref 5–15)
AST: 17 U/L (ref 15–41)
Alkaline Phosphatase: 51 U/L (ref 38–126)
BILIRUBIN TOTAL: 0.5 mg/dL (ref 0.3–1.2)
BUN: 9 mg/dL (ref 6–20)
CO2: 26 mmol/L (ref 22–32)
Calcium: 8.9 mg/dL (ref 8.9–10.3)
Chloride: 107 mmol/L (ref 101–111)
Creatinine, Ser: 0.66 mg/dL (ref 0.44–1.00)
GFR calc Af Amer: >60 mL/min (ref >60)
GFR calc non Af Amer: >60 mL/min (ref >60)
GLUCOSE: 96 mg/dL (ref 65–99)
Potassium: 3.7 mmol/L (ref 3.5–5.1)
Sodium: 138 mmol/L (ref 135–145)
TOTAL PROTEIN: 7.3 g/dL (ref 6.5–8.1)

## 2017-09-15 LAB — URINALYSIS, ROUTINE W REFLEX MICROSCOPIC
BACTERIA UA: NONE SEEN
BILIRUBIN URINE: NEGATIVE
Glucose, UA: NEGATIVE mg/dL
HGB URINE DIPSTICK: NEGATIVE
Ketones, ur: 5 mg/dL — AB
NITRITE: NEGATIVE
PROTEIN: NEGATIVE mg/dL
Specific Gravity, Urine: 1.021 (ref 1.005–1.030)
pH: 7 (ref 5.0–8.0)

## 2017-09-15 LAB — I-STAT BETA HCG BLOOD, ED (MC, WL, AP ONLY)

## 2017-09-15 LAB — LIPASE, BLOOD: Lipase: 25 U/L (ref 11–51)

## 2017-09-15 MED ORDER — KETOROLAC TROMETHAMINE 30 MG/ML IJ SOLN
15.0000 mg | Freq: Once | INTRAMUSCULAR | Status: AC
  Administered 2017-09-15: 15 mg via INTRAVENOUS
  Filled 2017-09-15: qty 1

## 2017-09-15 MED ORDER — PROMETHAZINE HCL 25 MG/ML IJ SOLN
12.5000 mg | Freq: Once | INTRAMUSCULAR | Status: AC
  Administered 2017-09-15: 12.5 mg via INTRAVENOUS
  Filled 2017-09-15: qty 1

## 2017-09-15 MED ORDER — SODIUM CHLORIDE 0.9 % IV BOLUS (SEPSIS)
500.0000 mL | Freq: Once | INTRAVENOUS | Status: AC
  Administered 2017-09-15: 500 mL via INTRAVENOUS

## 2017-09-15 NOTE — ED Provider Notes (Signed)
[facility]-EMERGENCY DEPT Provider Note   CSN: [id number] Arrival date & time: 09/15/17  1248     History   Chief Complaint Chief Complaint  Patient presents with  . Migraine  . Abdominal Pain    HPI Nicole [name] is a 27 y.o. female.  HPI Patient presents with headache for the last 2-3 days.  States she has a migraine.  History of previous headaches like this.  It is throbbing on the right side of her head.  No relief with Motrin at home.  Has reported photophobia and nausea although does not appear photophobic on my exam.  No numbness or weakness.  No confusion.  Last headache was a week ago Also has abdominal pain.  He goes to various parts of her abdomen.  She is been dealing with this for months.  States no causes been found.  Pain is dull.  Similar to other episodes she has had.  Denies possibility of pregnancy.  States that sometimes associates when she gets the headaches but not always. Past Medical History:  Diagnosis Date  . Pregnancy induced hypertension 2011    Patient Active Problem List   Diagnosis Date Noted  . Active labor 01/04/2014  . NVD (normal vaginal delivery) 01/04/2014  . Cesarean delivery, without mention of indication, delivered, with or without mention of antepartum condition 10/18/2011    Past Surgical History:  Procedure Laterality Date  . CESAREAN SECTION  10/14/2011   Procedure: CESAREAN SECTION;  Surgeon: Kathreen Cosier, MD;  Location: [facility];  Service: Gynecology;  Laterality: N/A;  Primary Cesarean Section Delivery Baby Boy @ 0001, Apgars 8/9  . WISDOM TOOTH EXTRACTION  2010    OB History    Gravida Para Term Preterm AB Living   4 3 3  0 0 3   SAB TAB Ectopic Multiple Live Births   0 0 0 0 3       Home Medications    Prior to Admission medications   Medication Sig Start Date End Date Taking? Authorizing Provider  Multiple Vitamin (MULTIVITAMIN WITH MINERALS) TABS tablet Take 1 tablet by mouth daily.   Yes  [provider]  ferrous sulfate 325 (65 FE) MG tablet Take 1 tablet (325 mg total) by mouth daily. Patient not taking: Reported on 09/15/2017 06/13/17   Dione Booze, MD  ondansetron (ZOFRAN) 4 MG tablet Take 1 tablet (4 mg total) by mouth every 6 (six) hours as needed for nausea or vomiting. Patient not taking: Reported on 09/15/2017 06/13/17   Dione Booze, MD  ondansetron (ZOFRAN) 4 MG tablet Take 1 tablet (4 mg total) by mouth every 8 (eight) hours as needed for nausea or vomiting. Patient not taking: Reported on 09/15/2017 06/17/17   Tegeler, Canary Brim, MD  potassium chloride SA (K-DUR,KLOR-CON) 20 MEQ tablet Take 1 tablet (20 mEq total) by mouth 2 (two) times daily. Patient not taking: Reported on 09/15/2017 06/13/17   Dione Booze, MD    Family History Family History  Problem Relation Age of Onset  . Hypertension Mother   . Hypertension Father     Social History Social History   Tobacco Use  . Smoking status: Current Some Day Smoker    Types: Cigars  . Smokeless tobacco: Never Used  Substance Use Topics  . Alcohol use: No  . Drug use: No     Allergies   Patient has no known allergies.   Review of Systems Review of Systems  Constitutional: Negative for appetite change, fatigue  and fever.  HENT: Negative for congestion.   Eyes: Positive for photophobia.  Respiratory: Negative for shortness of breath.   Cardiovascular: Negative for chest pain.  Gastrointestinal: Positive for abdominal pain and nausea.  Genitourinary: Negative for dysuria.  Musculoskeletal: Negative for back pain.  Skin: Negative for rash.  Neurological: Positive for headaches. Negative for tremors.  Hematological: Negative for adenopathy.  Psychiatric/Behavioral: Negative for confusion.     Physical Exam Updated Vital Signs BP 134/89 (BP Location: Right Arm)   Pulse (!) 55   Temp 98.4 F (36.9 C) (Oral)   Resp 18   Ht 5\' 4"  (1.626 m)   SpO2 100%   BMI 24.89 kg/m   Physical Exam    Constitutional: She appears well-developed.  HENT:  Head: Atraumatic.  Eyes: EOM are normal. Pupils are equal, round, and reactive to light.  Cardiovascular: Regular rhythm.  Pulmonary/Chest: Breath sounds normal.  Abdominal: Normal appearance and bowel sounds are normal. There is no tenderness.  Neurological: She is alert.  Skin: Skin is warm. Capillary refill takes less than 2 seconds.  Psychiatric: She has a normal mood and affect.     ED Treatments / Results  Labs (all labs ordered are listed, but only abnormal results are displayed) Labs Reviewed  COMPREHENSIVE METABOLIC PANEL - Abnormal; Notable for the following components:      Result Value   ALT 9 (*)    All other components within normal limits  CBC - Abnormal; Notable for the following components:   RBC 3.79 (*)    Hemoglobin 9.6 (*)    HCT 30.5 (*)    MCH 25.3 (*)    RDW 16.4 (*)    All other components within normal limits  URINALYSIS, ROUTINE W REFLEX MICROSCOPIC - Abnormal; Notable for the following components:   Ketones, ur 5 (*)    Leukocytes, UA TRACE (*)    Squamous Epithelial / LPF 0-5 (*)    All other components within normal limits  LIPASE, BLOOD  I-STAT BETA HCG BLOOD, ED ([facility], [facility], [facility] ONLY)    EKG  EKG Interpretation None       Radiology No results found.  Procedures Procedures (including critical care time)  Medications Ordered in ED Medications  sodium chloride 0.9 % bolus 500 mL (0 mLs Intravenous Stopped 09/15/17 2022)  promethazine (PHENERGAN) injection 12.5 mg (12.5 mg Intravenous Given 09/15/17 1951)  ketorolac (TORADOL) 30 MG/ML injection 15 mg (15 mg Intravenous Given 09/15/17 1951)     Initial Impression / Assessment and Plan / ED Course  I have reviewed the triage vital signs and the nursing notes.  Pertinent labs & imaging results that were available during my care of the patient were reviewed by me and considered in my medical decision making (see chart for details).      Patient with headache and abdominal pain.  Has had both previously.  Labs reassuring.  Feels better after treatment.  Nonfocal exam.  Will discharge home.  Final Clinical Impressions(s) / ED Diagnoses   Final diagnoses:  Nonintractable episodic headache, unspecified headache type  Abdominal pain, unspecified abdominal location    ED Discharge Orders    None       Benjiman CorePickering, Toia Micale, MD 09/15/17 2101

## 2017-09-15 NOTE — ED Triage Notes (Signed)
Patient c/o right side migraine for week. abd pain that started today. Patient adds that she has lots of pressure before she voids and will relieve wants actually voids-that been going on "for while". Denies any blood in urine.

## 2017-09-18 ENCOUNTER — Encounter (HOSPITAL_COMMUNITY): Payer: Self-pay | Admitting: Nurse Practitioner

## 2017-09-18 ENCOUNTER — Encounter (HOSPITAL_COMMUNITY): Payer: Self-pay | Admitting: *Deleted

## 2017-09-18 ENCOUNTER — Inpatient Hospital Stay (EMERGENCY_DEPARTMENT_HOSPITAL)
Admission: AD | Admit: 2017-09-18 | Discharge: 2017-09-18 | Disposition: A | Discharge status: Home / Self Care | Payer: Medicaid Other | Source: Ambulatory Visit | Attending: Obstetrics & Gynecology | Admitting: Obstetrics & Gynecology

## 2017-09-18 ENCOUNTER — Other Ambulatory Visit: Payer: Self-pay

## 2017-09-18 DIAGNOSIS — A599 Trichomoniasis, unspecified: Secondary | ICD-10-CM

## 2017-09-18 DIAGNOSIS — Z3202 Encounter for pregnancy test, result negative: Secondary | ICD-10-CM

## 2017-09-18 DIAGNOSIS — Z79899 Other long term (current) drug therapy: Secondary | ICD-10-CM | POA: Diagnosis not present

## 2017-09-18 DIAGNOSIS — G43009 Migraine without aura, not intractable, without status migrainosus: Secondary | ICD-10-CM | POA: Diagnosis not present

## 2017-09-18 DIAGNOSIS — R102 Pelvic and perineal pain: Secondary | ICD-10-CM | POA: Diagnosis not present

## 2017-09-18 DIAGNOSIS — R51 Headache: Secondary | ICD-10-CM | POA: Diagnosis present

## 2017-09-18 DIAGNOSIS — F17290 Nicotine dependence, other tobacco product, uncomplicated: Secondary | ICD-10-CM | POA: Diagnosis not present

## 2017-09-18 LAB — CBC
HCT: 34 % — ABNORMAL LOW (ref 36.0–46.0)
Hemoglobin: 10.9 g/dL — ABNORMAL LOW (ref 12.0–15.0)
MCH: 25.5 pg — ABNORMAL LOW (ref 26.0–34.0)
MCHC: 32.1 g/dL (ref 30.0–36.0)
MCV: 79.4 fL (ref 78.0–100.0)
PLATELETS: 385 10*3/uL (ref 150–400)
RBC: 4.28 MIL/uL (ref 3.87–5.11)
RDW: 16.4 % — AB (ref 11.5–15.5)
WBC: 6.9 10*3/uL (ref 4.0–10.5)

## 2017-09-18 LAB — WET PREP, GENITAL
Sperm: NONE SEEN
Yeast Wet Prep HPF POC: NONE SEEN

## 2017-09-18 LAB — URINALYSIS, ROUTINE W REFLEX MICROSCOPIC
Bacteria, UA: NONE SEEN
Bilirubin Urine: NEGATIVE
GLUCOSE, UA: NEGATIVE mg/dL
KETONES UR: 80 mg/dL — AB
Nitrite: NEGATIVE
Protein, ur: 30 mg/dL — AB
Specific Gravity, Urine: 1.028 (ref 1.005–1.030)
pH: 5 (ref 5.0–8.0)

## 2017-09-18 LAB — HCG, SERUM, QUALITATIVE: Preg, Serum: NEGATIVE

## 2017-09-18 MED ORDER — METRONIDAZOLE 500 MG PO TABS
2000.0000 mg | ORAL_TABLET | Freq: Once | ORAL | Status: AC
  Administered 2017-09-18: 2000 mg via ORAL
  Filled 2017-09-18: qty 4

## 2017-09-18 NOTE — Discharge Instructions (Signed)
Expedited Partner Therapy:  °Information Sheet for Patients and Partners  °            ° °You have been offered expedited partner therapy (EPT). This information sheet contains important information and warnings you need to be aware of, so please read it carefully.  ° °Expedited Partner Therapy (EPT) is the clinical practice of treating the sexual partners of persons who receive chlamydia, gonorrhea, or trichomoniasis diagnoses by providing medications or prescriptions to the patient. Patients then provide partners with these therapies without the health-care provider having examined the partner. In other words, EPT is a convenient, fast and private way for patients to help their sexual partners get treated.  ° °Chlamydia and gonorrhea are bacterial infections you get from having sex with a person who is already infected. Trichomoniasis (or “trich”) is a very common sexually transmitted infection (STI) that is caused by infection with a protozoan parasite called Trichomonas vaginalis.  Many people with these infections don’t know it because they feel fine, but without treatment these infections can cause serious health problems, such as pelvic inflammatory disease, ectopic pregnancy, infertility and increased risk of HIV.  ° °It is important to get treated as soon as possible to protect your health, to avoid spreading these infections to others, and to prevent yourself from becoming re-infected. The good news is these infections can be easily cured with proper antibiotic medicine. The best way to take care of your self is to see a doctor or go to your local health department. If you are not able to see a doctor or other medical provider, you should take EPT.  ° ° °Recommended Medication: °EPT for Chlamydia:  Azithromycin (Zithromax) 1 gram orally in a single dose °EPT for Gonorrhea:  Cefixime (Suprax) 400 milligrams orally in a single dose PLUS azithromycin (Zithromax) 1 gram orally in a single dose °EPT for  Trichomoniasis:  Metronidazole (Flagyl) 2 grams orally in a single dose ° ° °These medicines are very safe. However, you should not take them if you have ever had an allergic reaction (like a rash) to any of these medicines: azithromycin (Zithromax), erythromycin, clarithromycin (Biaxin), metronidazole (Flagyl), tinidazole (Tindimax). If you are uncertain about whether you have an allergy, call your medical provider or pharmacist before taking this medicine. If you have a serious, long-term illness like kidney, liver or heart disease, colitis or stomach problems, or you are currently taking other prescription medication, talk to your provider before taking this medication.  ° °Women: If you have lower belly pain, pain during sex, vomiting, or a fever, do not take this medicine. Instead, you should see a medical provider to be certain you do not have pelvic inflammatory disease (PID). PID can be serious and lead to infertility, pregnancy problems or chronic pelvic pain.  ° °Pregnant Women: It is very important for you to see a doctor to get pregnancy services and pre-natal care. These antibiotics for EPT are safe for pregnant women, but you still need to see a medical provider as soon as possible. It is also important to note that Doxycycline is an alternative therapy for chlamydia, but it should not be taken by someone who is pregnant.  ° °Men: If you have pain or swelling in the testicles or a fever, do not take this medicine and see a medical provider.    ° °Men who have sex with men (MSM): MSM in [place] continue to experience high rates of syphilis and HIV. Many MSM with gonorrhea or   chlamydia could also have syphilis and/or HIV and not know it. If you are a man who has sex with other men, it is very important that you see a medical provider and are tested for HIV and syphilis. EPT is not recommended for gonorrhea for MSM.  Recommended treatment for gonorrhea for MSM is Rocephin (shot) AND azithromycin  due to decreased cure rate.  Please see your medical provider if this is the case.   ° °Along with this information sheet is a prescription for the medicine. If you receive a prescription it will be in your name and will indicate your date of birth, or it will be in the name of “Expedited Partner Therapy”.   In either case, you can have the prescription filled at a pharmacy. You will be responsible for the cost of the medicine, unless you have prescription drug coverage. In that case, you could provide your name so the pharmacy could bill your health plan.  ° °Take the medication as directed. Some people will have a mild, upset stomach, which does not last long. AVOID alcohol 24 hours after taking metronidazole (Flagyl) to reduce the possibility of a disulfiram-like reaction (severe vomiting and abdominal pain).  After taking the medicine, do not have sex for 7 days. Do not share this medicine or give it to anyone else. It is important to tell everyone you have had sex with in the last 60 days that they need to go and get tested for sexually transmitted infections.  ° °Ways to prevent these and other sexually transmitted infections (STIs):  ° °• Abstain from sex. This is the only sure way to avoid getting an STI.  °• Use barrier methods, such as condoms, consistently and correctly.  °• Limit the number of sexual partners.  °• Have regular physical exams, including testing for STIs.  ° °For more information about EPT or other issues pertaining to an STI, please contact your medical provider or the [place] [facility] at [phone] or [email or web address]   ° °

## 2017-09-18 NOTE — ED Triage Notes (Signed)
Pt is c/o a migraine HA that she is rating 8/10. Reports hx of such, denies neurology care.

## 2017-09-18 NOTE — MAU Provider Note (Signed)
History     CSN: [id number]  Arrival date and time: 09/18/17 1741   First Provider Initiated Contact with Patient 09/18/17 1912     Chief Complaint  Patient presents with  . Pelvic Pain   HPI Nicole [name] is a 27 y.o. [phone] non pregnant female who presents with pelvic pain. She states the pain is ongoing but has gotten worse over the last 8 hours. She rates the pain an 9/10 and has not tried anything for the pain. She denies any vaginal bleeding or discharge. Unsure LMP and reports positive HPT today. She denies nausea, vomiting, diarrhea or constipation. Denies any urinary symptoms. Patient reports that she does not like to eat or drink during the day, stating "I never have and I don't want to."  She has a long history with many visits to ERs for the same complaint. She is frustrated because she feels like she cannot get any answers when she comes in but does not keep outpatient follow up.  OB History    Gravida Para Term Preterm AB Living   4 3 3  0 0 3   SAB TAB Ectopic Multiple Live Births   0 0 0 0 3      Past Medical History:  Diagnosis Date  . Pregnancy induced hypertension 2011    Past Surgical History:  Procedure Laterality Date  . CESAREAN SECTION  10/14/2011   Procedure: CESAREAN SECTION;  Surgeon: Kathreen Cosier, MD;  Location: [facility];  Service: Gynecology;  Laterality: N/A;  Primary Cesarean Section Delivery Baby Boy @ 0001, Apgars 8/9  . WISDOM TOOTH EXTRACTION  2010    Family History  Problem Relation Age of Onset  . Hypertension Mother   . Hypertension Father     Social History   Tobacco Use  . Smoking status: Current Some Day Smoker    Types: Cigars  . Smokeless tobacco: Never Used  Substance Use Topics  . Alcohol use: No  . Drug use: No    Allergies: No Known Allergies  Medications Prior to Admission  Medication Sig Dispense Refill Last Dose  . Multiple Vitamin (MULTIVITAMIN WITH MINERALS) TABS tablet Take 1 tablet by mouth daily.   Past  Week at Unknown time  . naproxen sodium (ALEVE) 220 MG tablet Take 440 mg by mouth 2 (two) times daily as needed (pain).   Past Week at Unknown time  . ferrous sulfate 325 (65 FE) MG tablet Take 1 tablet (325 mg total) by mouth daily. (Patient not taking: Reported on 09/15/2017) 30 tablet 0 Not Taking at Unknown time  . ondansetron (ZOFRAN) 4 MG tablet Take 1 tablet (4 mg total) by mouth every 8 (eight) hours as needed for nausea or vomiting. (Patient not taking: Reported on 09/15/2017) 12 tablet 0 Not Taking at Unknown time  . potassium chloride SA (K-DUR,KLOR-CON) 20 MEQ tablet Take 1 tablet (20 mEq total) by mouth 2 (two) times daily. (Patient not taking: Reported on 09/15/2017) 10 tablet 0 Not Taking at Unknown time    Review of Systems Physical Exam   Blood pressure 118/75, pulse (!) 57, temperature 98.7 F (37.1 C), temperature source Oral, resp. rate 15, height 5\' 5"  (1.651 m), weight 152 lb (68.9 kg), SpO2 100 %.  Physical Exam  MAU Course  Procedures Results for orders placed or performed during the hospital encounter of 09/18/17 (from the past 24 hour(s))  Urinalysis, Routine w reflex microscopic     Status: Abnormal   Collection Time: 09/18/17  6:18  PM  Result Value Ref Range   Color, Urine YELLOW YELLOW   APPearance HAZY (A) CLEAR   Specific Gravity, Urine 1.028 1.005 - 1.030   pH 5.0 5.0 - 8.0   Glucose, UA NEGATIVE NEGATIVE mg/dL   Hgb urine dipstick SMALL (A) NEGATIVE   Bilirubin Urine NEGATIVE NEGATIVE   Ketones, ur 80 (A) NEGATIVE mg/dL   Protein, ur 30 (A) NEGATIVE mg/dL   Nitrite NEGATIVE NEGATIVE   Leukocytes, UA SMALL (A) NEGATIVE   RBC / HPF 0-5 0 - 5 RBC/hpf   WBC, UA 6-30 0 - 5 WBC/hpf   Bacteria, UA NONE SEEN NONE SEEN   Squamous Epithelial / LPF 0-5 (A) NONE SEEN   Mucus PRESENT   hCG, serum, qualitative     Status: None   Collection Time: 09/18/17  6:51 PM  Result Value Ref Range   Preg, Serum NEGATIVE NEGATIVE  CBC     Status: Abnormal   Collection  Time: 09/18/17  6:51 PM  Result Value Ref Range   WBC 6.9 4.0 - 10.5 K/uL   RBC 4.28 3.87 - 5.11 MIL/uL   Hemoglobin 10.9 (L) 12.0 - 15.0 g/dL   HCT 21.334.0 (L) 08.636.0 - 57.846.0 %   MCV 79.4 78.0 - 100.0 fL   MCH 25.5 (L) 26.0 - 34.0 pg   MCHC 32.1 30.0 - 36.0 g/dL   RDW 46.916.4 (H) 62.911.5 - 52.815.5 %   Platelets 385 150 - 400 K/uL  Wet prep, genital     Status: Abnormal   Collection Time: 09/18/17  7:25 PM  Result Value Ref Range   Yeast Wet Prep HPF POC NONE SEEN NONE SEEN   Trich, Wet Prep PRESENT (A) NONE SEEN   Clue Cells Wet Prep HPF POC PRESENT (A) NONE SEEN   WBC, Wet Prep HPF POC FEW (A) NONE SEEN   Sperm NONE SEEN    MDM UA Qualitative HCG CBC Wet prep and gc/chlamydia  Care turned over to V. Deryk Bozman CNM at 2020.  Assessment and Plan   1. Trichimoniasis   2. Pelvic pain in female   3. Pregnancy examination or test, negative result    Wet prep showed Trich: Trich treated in MAU with 2,000mg  Flagyl. Expedited partner treatment information given to patient to inform partner to go to HD for treatment as soon as possible. Patient declines knowledge of partner's name in order to give written prescription for therapy.   Outpatient Pelvic US offered to patient to r/o ovarian cyst causing pain, patient declined US, states "she is not pregnant she does not need an US".   Follow up with PCP for worsening symptoms  Discharge patient. Patient stable at discharge   Sharyon CableVeronica C Tonishia Steffy, CNM 09/18/17, 8:35 PM

## 2017-09-18 NOTE — MAU Note (Signed)
[facility], pt. Started to cry and became silent.  Patient asked questions and would not respond. Discharge instructions reviewed with patient, patient signed discharge instructions via e-signature and threw away copy of discharge instructions. Patient crying, social work consult offered, patient refused and walked away.

## 2017-09-18 NOTE — MAU Note (Signed)
Pt reports pelvic pain x 8 hours, has had lower abd pain off/on for years

## 2017-09-19 ENCOUNTER — Emergency Department (HOSPITAL_COMMUNITY)
Admission: EM | Admit: 2017-09-19 | Discharge: 2017-09-19 | Disposition: A | Discharge status: Home / Self Care | Payer: Medicaid Other | Attending: Emergency Medicine | Admitting: Emergency Medicine

## 2017-09-19 DIAGNOSIS — G43009 Migraine without aura, not intractable, without status migrainosus: Secondary | ICD-10-CM

## 2017-09-19 LAB — GC/CHLAMYDIA PROBE AMP (~~LOC~~) NOT AT ARMC
CHLAMYDIA, DNA PROBE: POSITIVE — AB
Neisseria Gonorrhea: NEGATIVE

## 2017-09-19 MED ORDER — KETOROLAC TROMETHAMINE 60 MG/2ML IM SOLN
60.0000 mg | Freq: Once | INTRAMUSCULAR | Status: AC
  Administered 2017-09-19: 60 mg via INTRAMUSCULAR
  Filled 2017-09-19: qty 2

## 2017-09-19 MED ORDER — METOCLOPRAMIDE HCL 5 MG/ML IJ SOLN
10.0000 mg | Freq: Once | INTRAMUSCULAR | Status: AC
  Administered 2017-09-19: 10 mg via INTRAMUSCULAR
  Filled 2017-09-19: qty 2

## 2017-09-19 MED ORDER — DIPHENHYDRAMINE HCL 50 MG/ML IJ SOLN
25.0000 mg | Freq: Once | INTRAMUSCULAR | Status: AC
  Administered 2017-09-19: 25 mg via INTRAMUSCULAR
  Filled 2017-09-19: qty 1

## 2017-09-19 NOTE — ED Provider Notes (Signed)
[facility]-EMERGENCY DEPT Provider Note   CSN: [id number] Arrival date & time: 09/18/17  2109     History   Chief Complaint Chief Complaint  Patient presents with  . Migraine    HPI Nicole [name] is a 27 y.o. female.  Patient presents with gradual onset frontal headache similar to previous that she calls "migraine".  Headache is similar to previous she did not take anything for it at home.  She was seen for similar symptoms 4 days ago.  Denies thunderclap onset.  Associated with photophobia and phonophobia and nausea.  No vomiting or fever.  No focal weakness, numbness or tingling.  No bowel or bladder incontinence.  She denies any possibility of pregnancy.   The history is provided by the patient.  Migraine  Pertinent negatives include no chest pain, no abdominal pain, no headaches and no shortness of breath.    Past Medical History:  Diagnosis Date  . Pregnancy induced hypertension 2011    Patient Active Problem List   Diagnosis Date Noted  . Active labor 01/04/2014  . NVD (normal vaginal delivery) 01/04/2014  . Cesarean delivery, without mention of indication, delivered, with or without mention of antepartum condition 10/18/2011    Past Surgical History:  Procedure Laterality Date  . CESAREAN SECTION  10/14/2011   Procedure: CESAREAN SECTION;  Surgeon: Kathreen CosierBernard A Marshall, MD;  Location: [facility];  Service: Gynecology;  Laterality: N/A;  Primary Cesarean Section Delivery Baby Boy @ 0001, Apgars 8/9  . WISDOM TOOTH EXTRACTION  2010    OB History    Gravida Para Term Preterm AB Living   4 3 3  0 0 3   SAB TAB Ectopic Multiple Live Births   0 0 0 0 3       Home Medications    Prior to Admission medications   Medication Sig Start Date End Date Taking? Authorizing Provider  Multiple Vitamin (MULTIVITAMIN WITH MINERALS) TABS tablet Take 1 tablet by mouth daily.   Yes [provider]  naproxen sodium (ALEVE) 220 MG tablet Take 440 mg by  mouth 2 (two) times daily as needed (pain).   Yes [provider]  ferrous sulfate 325 (65 FE) MG tablet Take 1 tablet (325 mg total) by mouth daily. Patient not taking: Reported on 09/15/2017 06/13/17   Dione BoozeGlick, David, MD    Family History Family History  Problem Relation Age of Onset  . Hypertension Mother   . Hypertension Father     Social History Social History   Tobacco Use  . Smoking status: Current Some Day Smoker    Types: Cigars  . Smokeless tobacco: Never Used  Substance Use Topics  . Alcohol use: No  . Drug use: No     Allergies   Patient has no known allergies.   Review of Systems Review of Systems  Constitutional: Negative for activity change, appetite change and fever.  HENT: Negative for congestion.   Eyes: Positive for photophobia.  Respiratory: Negative for cough, chest tightness and shortness of breath.   Cardiovascular: Negative for chest pain.  Gastrointestinal: Negative for abdominal pain, nausea and vomiting.  Endocrine: Negative for polyuria.  Genitourinary: Negative for dysuria, hematuria and vaginal discharge.  Musculoskeletal: Negative for arthralgias and myalgias.  Skin: Negative for rash.  Neurological: Negative for dizziness, tremors, seizures, facial asymmetry and headaches.  Hematological: Negative for adenopathy.  Psychiatric/Behavioral: Negative for agitation and suicidal ideas.   all other systems are negative except as noted in the HPI and  PMH.     Physical Exam Updated Vital Signs BP 135/79   Pulse 76   Temp 98.4 F (36.9 C) (Oral)   Resp 18   LMP  (LMP Unknown)   SpO2 100%   Physical Exam  Constitutional: She is oriented to person, place, and time. She appears well-developed and well-nourished. No distress.  HENT:  Head: Normocephalic and atraumatic.  Mouth/Throat: Oropharynx is clear and moist. No oropharyngeal exudate.  Eyes: Conjunctivae and EOM are normal. Pupils are equal, round, and reactive to light.    Neck: Normal range of motion. Neck supple.  No meningismus.  Cardiovascular: Normal rate, regular rhythm, normal heart sounds and intact distal pulses.  No murmur heard. Pulmonary/Chest: Effort normal and breath sounds normal. No respiratory distress.  Abdominal: Soft. There is no tenderness. There is no rebound and no guarding.  Musculoskeletal: Normal range of motion. She exhibits no edema or tenderness.  Neurological: She is alert and oriented to person, place, and time. No cranial nerve deficit. She exhibits normal muscle tone. Coordination normal.   5/5 strength throughout. CN 2-12 intact.Equal grip strength.   Skin: Skin is warm.  Psychiatric: She has a normal mood and affect. Her behavior is normal.  Nursing note and vitals reviewed.    ED Treatments / Results  Labs (all labs ordered are listed, but only abnormal results are displayed) Labs Reviewed - No data to display  EKG  EKG Interpretation None       Radiology No results found.  Procedures Procedures (including critical care time)  Medications Ordered in ED Medications  ketorolac (TORADOL) injection 60 mg (not administered)  metoCLOPramide (REGLAN) injection 10 mg (not administered)  diphenhydrAMINE (BENADRYL) injection 25 mg (not administered)     Initial Impression / Assessment and Plan / ED Course  I have reviewed the triage vital signs and the nursing notes.  Pertinent labs & imaging results that were available during my care of the patient were reviewed by me and considered in my medical decision making (see chart for details).    Gradual onset headache similar to previous.  No thunderclap onset.  No focal neurological deficits.  Low suspicion for subarachnoid hemorrhage or meningitis.  Pregnancy test on January 8 was negative.  Gradual onset headache similar to previous.  Low suspicion for subretinal hemorrhage, meningitis or temporal arteritis.  Patient treated with Toradol, Reglan and  Benadryl.  Patient did have improvement with the above treatment.  She is tolerating p.o. and ambulatory. Neurology follow up given.  Patient stable for discharge.  Final Clinical Impressions(s) / ED Diagnoses   Final diagnoses:  Migraine without aura and without status migrainosus, not intractable    ED Discharge Orders    None       Refugio Vandevoorde, Jeannett Senior, MD 09/19/17 [phone]

## 2017-09-19 NOTE — Discharge Instructions (Signed)
Follow-up with your neurologist for recurrent headaches.  Return to the ED if you develop new or worsening symptoms.

## 2017-09-20 ENCOUNTER — Inpatient Hospital Stay (HOSPITAL_COMMUNITY)
Admission: AD | Admit: 2017-09-20 | Discharge: 2017-09-26 | DRG: 885 | Disposition: A | Discharge status: Home / Self Care | Payer: Medicaid Other | Source: Intra-hospital | Attending: Psychiatry | Admitting: Psychiatry

## 2017-09-20 ENCOUNTER — Encounter (HOSPITAL_COMMUNITY): Payer: Self-pay | Admitting: *Deleted

## 2017-09-20 ENCOUNTER — Other Ambulatory Visit: Payer: Self-pay

## 2017-09-20 ENCOUNTER — Encounter (HOSPITAL_COMMUNITY): Payer: Self-pay | Admitting: Emergency Medicine

## 2017-09-20 ENCOUNTER — Emergency Department (HOSPITAL_COMMUNITY)
Admission: EM | Admit: 2017-09-20 | Discharge: 2017-09-20 | Disposition: A | Discharge status: Home / Self Care | Payer: Medicaid Other | Attending: Emergency Medicine | Admitting: Emergency Medicine

## 2017-09-20 ENCOUNTER — Emergency Department (HOSPITAL_COMMUNITY)
Admission: EM | Admit: 2017-09-20 | Discharge: 2017-09-20 | Disposition: A | Discharge status: Other Acute Inpatient Hospital | Payer: Medicaid Other | Source: Home / Self Care | Attending: Emergency Medicine | Admitting: Emergency Medicine

## 2017-09-20 DIAGNOSIS — R4689 Other symptoms and signs involving appearance and behavior: Secondary | ICD-10-CM | POA: Diagnosis not present

## 2017-09-20 DIAGNOSIS — R440 Auditory hallucinations: Secondary | ICD-10-CM

## 2017-09-20 DIAGNOSIS — R45851 Suicidal ideations: Secondary | ICD-10-CM | POA: Diagnosis present

## 2017-09-20 DIAGNOSIS — F1210 Cannabis abuse, uncomplicated: Secondary | ICD-10-CM | POA: Diagnosis not present

## 2017-09-20 DIAGNOSIS — F332 Major depressive disorder, recurrent severe without psychotic features: Principal | ICD-10-CM | POA: Diagnosis present

## 2017-09-20 DIAGNOSIS — G8929 Other chronic pain: Secondary | ICD-10-CM | POA: Insufficient documentation

## 2017-09-20 DIAGNOSIS — X838XXA Intentional self-harm by other specified means, initial encounter: Secondary | ICD-10-CM | POA: Diagnosis not present

## 2017-09-20 DIAGNOSIS — F1010 Alcohol abuse, uncomplicated: Secondary | ICD-10-CM | POA: Diagnosis present

## 2017-09-20 DIAGNOSIS — Z91410 Personal history of adult physical and sexual abuse: Secondary | ICD-10-CM | POA: Diagnosis not present

## 2017-09-20 DIAGNOSIS — R450 Nervousness: Secondary | ICD-10-CM | POA: Diagnosis not present

## 2017-09-20 DIAGNOSIS — F1011 Alcohol abuse, in remission: Secondary | ICD-10-CM | POA: Diagnosis not present

## 2017-09-20 DIAGNOSIS — F1994 Other psychoactive substance use, unspecified with psychoactive substance-induced mood disorder: Secondary | ICD-10-CM | POA: Diagnosis not present

## 2017-09-20 DIAGNOSIS — F1410 Cocaine abuse, uncomplicated: Secondary | ICD-10-CM | POA: Diagnosis present

## 2017-09-20 DIAGNOSIS — T1491XA Suicide attempt, initial encounter: Secondary | ICD-10-CM | POA: Diagnosis not present

## 2017-09-20 DIAGNOSIS — F17290 Nicotine dependence, other tobacco product, uncomplicated: Secondary | ICD-10-CM | POA: Diagnosis present

## 2017-09-20 DIAGNOSIS — F410 Panic disorder [episodic paroxysmal anxiety] without agoraphobia: Secondary | ICD-10-CM | POA: Diagnosis not present

## 2017-09-20 DIAGNOSIS — Z560 Unemployment, unspecified: Secondary | ICD-10-CM | POA: Diagnosis not present

## 2017-09-20 DIAGNOSIS — F39 Unspecified mood [affective] disorder: Secondary | ICD-10-CM | POA: Diagnosis not present

## 2017-09-20 DIAGNOSIS — Z811 Family history of alcohol abuse and dependence: Secondary | ICD-10-CM | POA: Diagnosis not present

## 2017-09-20 DIAGNOSIS — Z599 Problem related to housing and economic circumstances, unspecified: Secondary | ICD-10-CM | POA: Diagnosis not present

## 2017-09-20 DIAGNOSIS — Z87891 Personal history of nicotine dependence: Secondary | ICD-10-CM

## 2017-09-20 DIAGNOSIS — R110 Nausea: Secondary | ICD-10-CM

## 2017-09-20 DIAGNOSIS — F17210 Nicotine dependence, cigarettes, uncomplicated: Secondary | ICD-10-CM | POA: Diagnosis not present

## 2017-09-20 DIAGNOSIS — G4700 Insomnia, unspecified: Secondary | ICD-10-CM | POA: Diagnosis present

## 2017-09-20 DIAGNOSIS — F323 Major depressive disorder, single episode, severe with psychotic features: Secondary | ICD-10-CM | POA: Diagnosis present

## 2017-09-20 DIAGNOSIS — R443 Hallucinations, unspecified: Secondary | ICD-10-CM

## 2017-09-20 DIAGNOSIS — A749 Chlamydial infection, unspecified: Secondary | ICD-10-CM

## 2017-09-20 DIAGNOSIS — Z915 Personal history of self-harm: Secondary | ICD-10-CM

## 2017-09-20 DIAGNOSIS — F1490 Cocaine use, unspecified, uncomplicated: Secondary | ICD-10-CM

## 2017-09-20 DIAGNOSIS — F458 Other somatoform disorders: Secondary | ICD-10-CM | POA: Diagnosis not present

## 2017-09-20 DIAGNOSIS — F419 Anxiety disorder, unspecified: Secondary | ICD-10-CM | POA: Diagnosis not present

## 2017-09-20 DIAGNOSIS — R1030 Lower abdominal pain, unspecified: Secondary | ICD-10-CM | POA: Diagnosis present

## 2017-09-20 DIAGNOSIS — Z23 Encounter for immunization: Secondary | ICD-10-CM | POA: Diagnosis not present

## 2017-09-20 DIAGNOSIS — Z598 Other problems related to housing and economic circumstances: Secondary | ICD-10-CM | POA: Diagnosis not present

## 2017-09-20 DIAGNOSIS — R109 Unspecified abdominal pain: Secondary | ICD-10-CM

## 2017-09-20 LAB — CBC
HEMATOCRIT: 31.3 % — AB (ref 36.0–46.0)
Hemoglobin: 10.2 g/dL — ABNORMAL LOW (ref 12.0–15.0)
MCH: 25.8 pg — ABNORMAL LOW (ref 26.0–34.0)
MCHC: 32.6 g/dL (ref 30.0–36.0)
MCV: 79 fL (ref 78.0–100.0)
Platelets: 360 10*3/uL (ref 150–400)
RBC: 3.96 MIL/uL (ref 3.87–5.11)
RDW: 15.8 % — AB (ref 11.5–15.5)
WBC: 4.4 10*3/uL (ref 4.0–10.5)

## 2017-09-20 LAB — RAPID URINE DRUG SCREEN, HOSP PERFORMED
Amphetamines: NOT DETECTED
Barbiturates: NOT DETECTED
Benzodiazepines: NOT DETECTED
Cocaine: NOT DETECTED
Opiates: NOT DETECTED
Tetrahydrocannabinol: POSITIVE — AB

## 2017-09-20 LAB — COMPREHENSIVE METABOLIC PANEL
ALBUMIN: 4.4 g/dL (ref 3.5–5.0)
ALK PHOS: 50 U/L (ref 38–126)
ALT: 14 U/L (ref 14–54)
ANION GAP: 8 (ref 5–15)
AST: 31 U/L (ref 15–41)
BILIRUBIN TOTAL: 0.8 mg/dL (ref 0.3–1.2)
BUN: 17 mg/dL (ref 6–20)
CALCIUM: 9.3 mg/dL (ref 8.9–10.3)
CO2: 23 mmol/L (ref 22–32)
Chloride: 106 mmol/L (ref 101–111)
Creatinine, Ser: 0.67 mg/dL (ref 0.44–1.00)
GFR calc Af Amer: >60 mL/min (ref >60)
GLUCOSE: 100 mg/dL — AB (ref 65–99)
Potassium: 3.6 mmol/L (ref 3.5–5.1)
Sodium: 137 mmol/L (ref 135–145)
TOTAL PROTEIN: 7.9 g/dL (ref 6.5–8.1)

## 2017-09-20 LAB — PREGNANCY, URINE: PREG TEST UR: NEGATIVE

## 2017-09-20 LAB — SALICYLATE LEVEL: Salicylate Lvl: <7 mg/dL (ref 2.8–30.0)

## 2017-09-20 LAB — ACETAMINOPHEN LEVEL: Acetaminophen (Tylenol), Serum: <10 ug/mL — ABNORMAL LOW (ref 10–30)

## 2017-09-20 LAB — ETHANOL: Alcohol, Ethyl (B): <10 mg/dL (ref <10)

## 2017-09-20 LAB — I-STAT BETA HCG BLOOD, ED (MC, WL, AP ONLY)

## 2017-09-20 MED ORDER — ALUM & MAG HYDROXIDE-SIMETH 200-200-20 MG/5ML PO SUSP: 30.0000 mL | ORAL | Status: DC | PRN

## 2017-09-20 MED ORDER — PNEUMOCOCCAL VAC POLYVALENT 25 MCG/0.5ML IJ INJ
0.5000 mL | INJECTION | INTRAMUSCULAR | Status: AC
  Administered 2017-09-21: 0.5 mL via INTRAMUSCULAR

## 2017-09-20 MED ORDER — CITALOPRAM HYDROBROMIDE 10 MG PO TABS
10.0000 mg | ORAL_TABLET | Freq: Every day | ORAL | Status: DC
  Administered 2017-09-20 – 2017-09-23 (×4): 10 mg via ORAL
  Filled 2017-09-20 (×8): qty 1

## 2017-09-20 MED ORDER — AZITHROMYCIN 250 MG PO TABS
1000.0000 mg | ORAL_TABLET | Freq: Once | ORAL | Status: AC
  Administered 2017-09-20: 1000 mg via ORAL
  Filled 2017-09-20: qty 4

## 2017-09-20 MED ORDER — HYDROXYZINE HCL 25 MG PO TABS
25.0000 mg | ORAL_TABLET | Freq: Four times a day (QID) | ORAL | Status: DC | PRN
  Administered 2017-09-20 – 2017-09-21 (×2): 25 mg via ORAL
  Filled 2017-09-20 (×2): qty 1

## 2017-09-20 MED ORDER — TRAZODONE HCL 50 MG PO TABS
50.0000 mg | ORAL_TABLET | Freq: Every evening | ORAL | Status: DC | PRN
  Administered 2017-09-20 – 2017-09-22 (×3): 50 mg via ORAL
  Filled 2017-09-20 (×5): qty 1

## 2017-09-20 MED ORDER — ACETAMINOPHEN 325 MG PO TABS: 650.0000 mg | ORAL_TABLET | Freq: Four times a day (QID) | ORAL | Status: DC | PRN

## 2017-09-20 MED ORDER — MAGNESIUM HYDROXIDE 400 MG/5ML PO SUSP: 30.0000 mL | Freq: Every day | ORAL | Status: DC | PRN

## 2017-09-20 MED ORDER — NICOTINE POLACRILEX 2 MG MT GUM: 2.0000 mg | CHEWING_GUM | OROMUCOSAL | Status: DC | PRN

## 2017-09-20 MED ORDER — ARIPIPRAZOLE 5 MG PO TABS
5.0000 mg | ORAL_TABLET | Freq: Every day | ORAL | Status: DC
  Administered 2017-09-20 – 2017-09-22 (×3): 5 mg via ORAL
  Filled 2017-09-20 (×7): qty 1
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
## 2017-09-20 NOTE — ED Notes (Signed)
[facility]

## 2017-09-20 NOTE — Progress Notes (Signed)
D.  Pt in dayroom on approach, denies complaints at this time.  Pt was positive for evening AA group, minimal but appropriate interaction with peers on unit.  Pt denies SI/HI at this time, does report command hallucinations periodically.  A.  Support and encouragement offered, medication given as ordered.  R.  Pt remains safe on the unit, will continue to monitor.

## 2017-09-20 NOTE — ED Notes (Signed)
Security has informed RN [facility]

## 2017-09-20 NOTE — ED Triage Notes (Signed)
Patient here with complaints of suicidal ideation. States that she is "thinking about harming herself". " I found a knife in the trash can". " I just need help".

## 2017-09-20 NOTE — ED Notes (Signed)
Pt has one black back pack and 1 belongings at the nurses station.
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
## 2017-09-20 NOTE — ED Notes (Addendum)
Patient belongings placed in cabinets above the nurses desk for 19-22

## 2017-09-20 NOTE — Progress Notes (Signed)
Pt attended AA group this evening.  
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
## 2017-09-20 NOTE — Tx Team (Signed)
Initial Treatment Plan 09/20/2017 1415 Nicole PaulaKashia Angerer ZOX:[id number]    PATIENT STRESSORS: Financial difficulties Occupational concerns Substance abuse - patient reports clean from alcohol x 2 weeks   PATIENT STRENGTHS: Average or above average intelligence Communication skills General fund of knowledge Motivation for treatment/growth Physical Health Supportive family/friends Work skills   PATIENT IDENTIFIED PROBLEMS:   "To become mentally stable. I can't keep having spells in front of my kids."    "Growth. I want to grow."                DISCHARGE CRITERIA:  Improved stabilization in mood, thinking, and/or behavior Need for constant or close observation no longer present Reduction of life-threatening or endangering symptoms to within safe limits  PRELIMINARY DISCHARGE PLAN: Attend aftercare/continuing care group Attend 12-step recovery group Outpatient therapy Return to previous living arrangement  PATIENT/FAMILY INVOLVEMENT: This treatment plan has been presented to and reviewed with the patient, Nicole [name], and/or family member.  The patient and family have been given the opportunity to ask questions and make suggestions.  Lawrence MarseillesFriedman, Danay Mckellar Eakes, RN 09/20/2017, 3:39 PM

## 2017-09-20 NOTE — ED Notes (Signed)
Urine for preg testing was in lab.  Lab said that they would run order.

## 2017-09-20 NOTE — BH Assessment (Signed)
Per Julieanne Cottonina, AC, pt accepted to 304-2 to Dr. Jama Flavorsobos. Pt can be transported by [facility]. Call report to [phone]. Voluntary paperwork signed and faxed.

## 2017-09-20 NOTE — ED Triage Notes (Signed)
Pt [facility] pt told her that she wasn't visiting anyone she was just there Pt was seen yesterday and discharged and it seems like she's been in the hospital roaming around since discharge Pt complains of something moving in her stomach and has been having this issue for about 6 months, she states that she's had blood work but she doesn't know the results Pt states that it could be in her head Pt wants an ultrasound to see if anything is in her stomach
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
## 2017-09-20 NOTE — ED Notes (Signed)
[facility]. Transport will be here to pick up pt in or around 20 minutes.
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
## 2017-09-20 NOTE — ED Notes (Signed)
Pt was tired of waiting for transportation [facility] is aware and [facility]. [facility] is [facility] and sitter.

## 2017-09-20 NOTE — ED Notes (Signed)
ED TO INPATIENT HANDOFF REPORT  Name/Age/Gender Nicole [name] 27 y.o. female  Code Status    Code Status Orders  (From admission, onward)        Start     Ordered   09/20/17 0527  Full code  Continuous     09/20/17 0526    Code Status History    Date Active Date Inactive Code Status Order ID Comments User Context   06/18/2017 00:30 06/18/2017 05:11 Full Code [id number]  Pattricia Boss, MD ED   01/04/2014 09:37 01/06/2014 14:35 Full Code [id number]  Frederico Hamman, MD Inpatient   01/04/2014 00:57 01/04/2014 09:37 Full Code [id number]  Wallace Cullens, RN Inpatient      Home/SNF/Other Home  Chief Complaint medical clearance  Level of Care/Admitting Diagnosis ED Disposition    ED Disposition Condition Comment   Discharge  Discharge home      Medical History Past Medical History:  Diagnosis Date  . Pregnancy induced hypertension 2011    Allergies No Known Allergies  IV Location/Drains/Wounds Patient Lines/Drains/Airways Status   Active Line/Drains/Airways    None          Labs/Imaging Results for orders placed or performed during the hospital encounter of 09/20/17 (from the past 48 hour(s))  Rapid urine drug screen (hospital performed)     Status: Abnormal   Collection Time: 09/20/17  3:52 AM  Result Value Ref Range   Opiates NONE DETECTED NONE DETECTED   Cocaine NONE DETECTED NONE DETECTED   Benzodiazepines NONE DETECTED NONE DETECTED   Amphetamines NONE DETECTED NONE DETECTED   Tetrahydrocannabinol POSITIVE (A) NONE DETECTED   Barbiturates NONE DETECTED NONE DETECTED    Comment: (NOTE) DRUG SCREEN FOR MEDICAL PURPOSES ONLY.  IF CONFIRMATION IS NEEDED FOR ANY PURPOSE, NOTIFY LAB WITHIN 5 DAYS. LOWEST DETECTABLE LIMITS FOR URINE DRUG SCREEN Drug Class                     Cutoff (ng/mL) Amphetamine and metabolites    1000 Barbiturate and metabolites    200 Benzodiazepine                 542 Tricyclics and metabolites     300 Opiates and  metabolites        300 Cocaine and metabolites        300 THC                            50   Comprehensive metabolic panel     Status: Abnormal   Collection Time: 09/20/17  3:59 AM  Result Value Ref Range   Sodium 137 135 - 145 mmol/L   Potassium 3.6 3.5 - 5.1 mmol/L   Chloride 106 101 - 111 mmol/L   CO2 23 22 - 32 mmol/L   Glucose, Bld 100 (H) 65 - 99 mg/dL   BUN 17 6 - 20 mg/dL   Creatinine, Ser 0.67 0.44 - 1.00 mg/dL   Calcium 9.3 8.9 - 10.3 mg/dL   Total Protein 7.9 6.5 - 8.1 g/dL   Albumin 4.4 3.5 - 5.0 g/dL   AST 31 15 - 41 U/L   ALT 14 14 - 54 U/L   Alkaline Phosphatase 50 38 - 126 U/L   Total Bilirubin 0.8 0.3 - 1.2 mg/dL   GFR calc non Af Amer >60 >60 mL/min   GFR calc Af Amer >60 >60 mL/min    Comment: (NOTE) The eGFR has  been calculated using the CKD EPI equation. This calculation has not been validated in all clinical situations. eGFR's persistently <60 mL/min signify possible Chronic Kidney Disease.    Anion gap 8 5 - 15  cbc     Status: Abnormal   Collection Time: 09/20/17  3:59 AM  Result Value Ref Range   WBC 4.4 4.0 - 10.5 K/uL   RBC 3.96 3.87 - 5.11 MIL/uL   Hemoglobin 10.2 (L) 12.0 - 15.0 g/dL   HCT 31.3 (L) 36.0 - 46.0 %   MCV 79.0 78.0 - 100.0 fL   MCH 25.8 (L) 26.0 - 34.0 pg   MCHC 32.6 30.0 - 36.0 g/dL   RDW 15.8 (H) 11.5 - 15.5 %   Platelets 360 150 - 400 K/uL  Ethanol     Status: None   Collection Time: 09/20/17  4:00 AM  Result Value Ref Range   Alcohol, Ethyl (B) <10 <10 mg/dL    Comment:        LOWEST DETECTABLE LIMIT FOR SERUM ALCOHOL IS 10 mg/dL FOR MEDICAL PURPOSES ONLY   Salicylate level     Status: None   Collection Time: 09/20/17  4:00 AM  Result Value Ref Range   Salicylate Lvl <2.7 2.8 - 30.0 mg/dL  Acetaminophen level     Status: Abnormal   Collection Time: 09/20/17  4:00 AM  Result Value Ref Range   Acetaminophen (Tylenol), Serum <10 (L) 10 - 30 ug/mL    Comment:        THERAPEUTIC CONCENTRATIONS VARY SIGNIFICANTLY. A  RANGE OF 10-30 ug/mL MAY BE AN EFFECTIVE CONCENTRATION FOR MANY PATIENTS. HOWEVER, SOME ARE BEST TREATED AT CONCENTRATIONS OUTSIDE THIS RANGE. ACETAMINOPHEN CONCENTRATIONS >150 ug/mL AT 4 HOURS AFTER INGESTION AND >50 ug/mL AT 12 HOURS AFTER INGESTION ARE OFTEN ASSOCIATED WITH TOXIC REACTIONS.   Pregnancy, urine     Status: None   Collection Time: 09/20/17  4:00 AM  Result Value Ref Range   Preg Test, Ur NEGATIVE NEGATIVE  I-Stat Beta hCG blood, ED ([facility], [facility], [facility] only)     Status: None   Collection Time: 09/20/17  4:13 AM  Result Value Ref Range   I-stat hCG, quantitative <5.0 <5 mIU/mL   Comment 3            Comment:   GEST. AGE      CONC.  (mIU/mL)   <=1 WEEK        5 - 50     2 WEEKS       50 - 500     3 WEEKS       100 - 10,000     4 WEEKS     1,000 - 30,000        FEMALE AND NON-PREGNANT FEMALE:     LESS THAN 5 mIU/mL    No results found.  Pending Labs Unresulted Labs (From admission, onward)   None      Vitals/Pain Today's Vitals   09/20/17 1106 09/20/17 1106 09/20/17 1237 09/20/17 1237  BP: 114/72   124/83  Pulse: 74   71  Resp: 17   15  Temp: 98.9 F (37.2 C)   99.3 F (37.4 C)  TempSrc: Oral   Oral  SpO2: 100%   100%  PainSc:  0-No pain 0-No pain     Isolation Precautions No active isolations  Medications Medications - No data to display  Mobility walks
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
## 2017-09-20 NOTE — ED Notes (Signed)
Bed: [facility] Expected date:  Expected time:  Means of arrival:  Comments: 

## 2017-09-20 NOTE — ED Notes (Signed)
Pt became very loud on the way out--- pt took Zithromax 1000 mg.  Discharge papers given to pt took a look at it but slammed it onto the floor and briskly walked out the door.

## 2017-09-20 NOTE — ED Triage Notes (Signed)
Jacki ConesLaurie, [facility] wants to be called when patient is discharged

## 2017-09-21 DIAGNOSIS — G4700 Insomnia, unspecified: Secondary | ICD-10-CM

## 2017-09-21 DIAGNOSIS — R45851 Suicidal ideations: Secondary | ICD-10-CM

## 2017-09-21 DIAGNOSIS — F419 Anxiety disorder, unspecified: Secondary | ICD-10-CM

## 2017-09-21 DIAGNOSIS — F17210 Nicotine dependence, cigarettes, uncomplicated: Secondary | ICD-10-CM

## 2017-09-21 DIAGNOSIS — F1010 Alcohol abuse, uncomplicated: Secondary | ICD-10-CM | POA: Diagnosis present

## 2017-09-21 DIAGNOSIS — R450 Nervousness: Secondary | ICD-10-CM

## 2017-09-21 DIAGNOSIS — F39 Unspecified mood [affective] disorder: Secondary | ICD-10-CM

## 2017-09-21 LAB — LIPID PANEL
CHOL/HDL RATIO: 3.3 ratio
Cholesterol: 147 mg/dL (ref 0–200)
HDL: 45 mg/dL (ref >40)
LDL CALC: 94 mg/dL (ref 0–99)
Triglycerides: 41 mg/dL (ref <150)
VLDL: 8 mg/dL (ref 0–40)

## 2017-09-21 LAB — HEMOGLOBIN A1C
HEMOGLOBIN A1C: 5.4 % (ref 4.8–5.6)
Mean Plasma Glucose: 108.28 mg/dL

## 2017-09-21 LAB — URINE CULTURE

## 2017-09-21 LAB — TSH: TSH: 0.621 u[IU]/mL (ref 0.350–4.500)

## 2017-09-21 MED ORDER — GABAPENTIN 100 MG PO CAPS
100.0000 mg | ORAL_CAPSULE | Freq: Three times a day (TID) | ORAL | Status: DC
  Administered 2017-09-21 – 2017-09-25 (×14): 100 mg via ORAL
  Filled 2017-09-21 (×19): qty 1

## 2017-09-21 NOTE — Progress Notes (Signed)
DAR NOTE: Pt present with flat affect and depressed mood in the unit. Pt has been in the dayroom most of the time interacting with peers. Reports fair night sleep, good appetite, normal energy, and poor concentration. Pt rate depression at 0, hopeless ness at 4, and anxiety at 5. Pt's safety ensured with 15 minute and environmental checks. Pt currently denies SI/HI and A/V hallucinations. Pt verbally agrees to seek staff if SI/HI or A/VH occurs and to consult with staff before acting on these thoughts. Will continue POC.

## 2017-09-21 NOTE — Progress Notes (Signed)
Pt moved to 403-2, pt stated she like it at [address].

## 2017-09-21 NOTE — Progress Notes (Signed)
[facility]  09/21/2017 1:56 PM Nicole PaulaKashia [name]  MRN:  [id number]   Subjective:  Patient reports that she is starting to feel slightly better. Patient reports that she slept good last night after taking a Trazodone. She reports feeling agitated some today, a mild headache, and reports some mild nausea each morning, but goes away. She has been eating fair. She reports that she drinks alcohol because she is depressed and she has minimal cravings.     Objective: Patient's chart and findings reviewed and discussed with treatment team. Patient presents in the day room and is appropriate and pleasant. Will start some Gabapentin 100 mg TID for agitation. She states she wants AA after discharge. Patient has more symptoms to be treated on [address] for coping skills and treatment for other SI and MDD. She reports taht the drinking comes after she starts feeling depressed.   Principal Problem: Major depressive disorder, recurrent severe without psychotic features (HCC) Diagnosis:   Patient Active Problem List   Diagnosis Date Noted  . Alcohol abuse [F10.10] 09/21/2017  . 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 Active labor [IMO0001] 01/04/2014  . NVD (normal vaginal delivery) [O80] 01/04/2014  . Cesarean delivery, without mention of indication, delivered, with or without mention of antepartum condition [O82] 10/18/2011   Total Time spent with patient: 25 minutes  Past Psychiatric History: See H&P  Past Medical History:  Past Medical History:  Diagnosis Date  . Pregnancy induced hypertension 2011    Past Surgical History:  Procedure Laterality Date  . CESAREAN SECTION  10/14/2011   Procedure: CESAREAN SECTION;  Surgeon: Kathreen CosierBernard A Marshall, MD;  Location: [facility];  Service: Gynecology;  Laterality: N/A;  Primary Cesarean Section Delivery Baby Boy @ 0001, Apgars 8/9  . WISDOM TOOTH EXTRACTION  2010   Family History:  Family History   Problem Relation Age of Onset  . Hypertension Mother   . Hypertension Father    Family Psychiatric  History: See H&P Social History:  Social History   Substance and Sexual Activity  Alcohol Use No     Social History   Substance and Sexual Activity  Drug Use No    Social History   Socioeconomic History  . Marital status: Single    Spouse name: None  . Number of children: None  . Years of education: None  . Highest education level: None  Social Needs  . Financial resource strain: None  . Food insecurity - worry: None  . Food insecurity - inability: None  . Transportation needs - medical: None  . Transportation needs - non-medical: None  Occupational History  . None  Tobacco Use  . Smoking status: Current Some Day Smoker    Types: Cigars  . Smokeless tobacco: Never Used  Substance and Sexual Activity  . Alcohol use: No  . Drug use: No  . Sexual activity: Not Currently    Birth control/protection: None  Other Topics Concern  . None  Social History Narrative  . None   Additional Social History:                         Sleep: Good  Appetite:  Fair  Current Medications: Current Facility-Administered Medications  Medication Dose Route Frequency Provider Last Rate Last Dose  . acetaminophen (TYLENOL) tablet 650 mg  650 mg Oral Q6H PRN Charm RingsLord, Jamison Y, NP      . alum & mag hydroxide-simeth (  MAALOX/MYLANTA) 200-200-20 MG/5ML suspension 30 mL  30 mL Oral Q4H PRN Charm Rings, NP      . ARIPiprazole (ABILIFY) tablet 5 mg  5 mg Oral Daily Cobos, Rockey Situ, MD   5 mg at 09/21/17 0847  . citalopram (CELEXA) tablet 10 mg  10 mg Oral Daily Cobos, Rockey Situ, MD   10 mg at 09/21/17 0846  . gabapentin (NEURONTIN) capsule 100 mg  100 mg Oral TID Money, Gerlene Burdock, FNP      . hydrOXYzine (ATARAX/VISTARIL) tablet 25 mg  25 mg Oral Q6H PRN Cobos, Rockey Situ, MD   25 mg at 09/20/17 2250  . magnesium hydroxide (MILK OF MAGNESIA) suspension 30 mL  30 mL Oral Daily PRN  Charm Rings, NP      . nicotine polacrilex (NICORETTE) gum 2 mg  2 mg Oral PRN Cobos, Rockey Situ, MD      . traZODone (DESYREL) tablet 50 mg  50 mg Oral QHS PRN Cobos, Rockey Situ, MD   50 mg at 09/20/17 2250    Lab Results:  Results for orders placed or performed during the hospital encounter of 09/20/17 (from the past 48 hour(s))  TSH     Status: None   Collection Time: 09/21/17  6:34 AM  Result Value Ref Range   TSH 0.621 0.350 - 4.500 uIU/mL    Comment: Performed by a 3rd Generation assay with a functional sensitivity of <=0.01 uIU/mL. Performed at [facility], [address], [place], [place] [address]   Lipid panel     Status: None   Collection Time: 09/21/17  6:34 AM  Result Value Ref Range   Cholesterol 147 0 - 200 mg/dL   Triglycerides 41 <956 mg/dL   HDL 45 >21 mg/dL   Total CHOL/HDL Ratio 3.3 RATIO   VLDL 8 0 - 40 mg/dL   LDL Cholesterol 94 0 - 99 mg/dL    Comment:        Total Cholesterol/HDL:CHD Risk Coronary Heart Disease Risk Table                     Men   Women  1/2 Average Risk   3.4   3.3  Average Risk       5.0   4.4  2 X Average Risk   9.6   7.1  3 X Average Risk  23.4   11.0        Use the calculated Patient Ratio above and the CHD Risk Table to determine the patient's CHD Risk.        ATP III CLASSIFICATION (LDL):  <100     mg/dL   Optimal  308-657  mg/dL   Near or Above                    Optimal  130-159  mg/dL   Borderline  846-962  mg/dL   High  >952     mg/dL   Very High Performed at [facility], [address] [place], [place] [address]   Hemoglobin A1c     Status: None   Collection Time: 09/21/17  6:34 AM  Result Value Ref Range   Hgb A1c MFr Bld 5.4 4.8 - 5.6 %    Comment: (NOTE) Pre diabetes:          5.7%-6.4% Diabetes:              >6.4% Glycemic control for   <7.0%  adults with diabetes    Mean Plasma Glucose 108.28 mg/dL    Comment: Performed at [facility], [address], [place], [place] [address]    Blood Alcohol level:  Lab Results  Component Value Date   ETH <10 09/20/2017   ETH <10 06/17/2017    Metabolic Disorder Labs: Lab Results  Component Value Date   HGBA1C 5.4 09/21/2017   MPG 108.28 09/21/2017   No results found for: PROLACTIN Lab Results  Component Value Date   CHOL 147 09/21/2017   TRIG 41 09/21/2017   HDL 45 09/21/2017   CHOLHDL 3.3 09/21/2017   VLDL 8 09/21/2017   LDLCALC 94 09/21/2017    Physical Findings: AIMS: Facial and Oral Movements Muscles of Facial Expression: None, normal Lips and Perioral Area: None, normal Jaw: None, normal Tongue: None, normal,Extremity Movements Upper (arms, wrists, hands, fingers): None, normal Lower (legs, knees, ankles, toes): None, normal, Trunk Movements Neck, shoulders, hips: None, normal, Overall Severity Severity of abnormal movements (highest score from questions above): None, normal Incapacitation due to abnormal movements: None, normal Patient's awareness of abnormal movements (rate only patient's report): No Awareness, Dental Status Current problems with teeth and/or dentures?: No Does patient usually wear dentures?: No  CIWA:  CIWA-Ar Total: 0 COWS:     Musculoskeletal: Strength & Muscle Tone: within normal limits Gait & Station: normal Patient leans: N/A  Psychiatric Specialty Exam: Physical Exam  Nursing note and vitals reviewed. Constitutional: She is oriented to person, place, and time. She appears well-developed and well-nourished.  Cardiovascular: Normal rate.  Respiratory: Effort normal.  Musculoskeletal: Normal range of motion.  Neurological: She is alert and oriented to person, place, and time.  Skin: Skin is warm.    Review of Systems  Constitutional: Negative.   HENT: Negative.   Eyes: Negative.   Respiratory: Negative.   Cardiovascular: Negative.   Gastrointestinal: Negative.   Genitourinary: Negative.   Musculoskeletal: Negative.   Skin: Negative.    Neurological: Negative.   Endo/Heme/Allergies: Negative.   Psychiatric/Behavioral: Positive for depression and suicidal ideas. The patient is nervous/anxious.     Blood pressure 110/80, pulse 87, temperature 97.6 F (36.4 C), temperature source Oral, resp. rate 16, height 5\' 5"  (1.651 m), weight 71.7 kg (158 lb).Body mass index is 26.29 kg/m.  General Appearance: Casual  Eye Contact:  Good  Speech:  Clear and Coherent and Normal Rate  Volume:  Normal  Mood:  Depressed  Affect:  Flat  Thought Process:  Goal Directed and Descriptions of Associations: Intact  Orientation:  Full (Time, Place, and Person)  Thought Content:  WDL  Suicidal Thoughts:  Yes.  without intent/plan  Homicidal Thoughts:  No  Memory:  Immediate;   Good Recent;   Good Remote;   Good  Judgement:  Fair  Insight:  Good  Psychomotor Activity:  Normal  Concentration:  Concentration: Good and Attention Span: Good  Recall:  Good  Fund of Knowledge:  Good  Language:  Good  Akathisia:  No  Handed:  Right  AIMS (if indicated):     Assets:  Communication Skills Desire for Improvement Financial Resources/Insurance Housing Physical Health Social Support Transportation  ADL's:  Intact  Cognition:  WNL  Sleep:  Number of Hours: 6   Problems Addressed: MDD severe Alcohol abuse  Treatment Plan Summary: Daily contact with patient to assess and evaluate symptoms and progress in treatment, Medication management and Plan is to:  -Start Gabapentin 100 mg PO TID for agitation -Continue Celexa  10 mg PO Daily for mood stability -Continue Abilify 5 mg PO Daily for mood stability -Continue Trazodone 50 mg PO QHS PRN for insomnia -Continue Vistaril 25 mg Q6H PRN for anxiety -Encourage group therapy participation  Maryfrances Bunnell, FNP 09/21/2017, 1:56 PM   Agree with NP Progress Note

## 2017-09-21 NOTE — Progress Notes (Signed)
Recreation Therapy Notes  Date: 09/21/17 Time: 0930 Location: [address] Dayroom  Group Topic: Stress Management  Goal Area(s) Addresses:  Patient will verbalize importance of using healthy stress management.  Patient will identify positive emotions associated with healthy stress management.   Behavioral Response: Engaged  Intervention: Stress Management  Activity :  [place] Sanct[place].  LRT introduced the stress management technique of guided imagery.  LRT read a script to allow patients to visualize being in a protected wildlife sanctuary.  Patients were to follow along as script was read to engage in activity.  Education:  Stress Management, Discharge Planning.   Education Outcome: Acknowledges edcuation/In group clarification offered/Needs additional education  Clinical Observations/Feedback: Pt attended group.     David Rodriquez, LRT/CTRS         Tashiya Souders A 09/21/2017 12:14 PM 

## 2017-09-21 NOTE — Tx Team (Signed)
Interdisciplinary Treatment and Diagnostic Plan Update  09/21/2017 Time of Session: 0830AM Nicole PaulaKashia [name] MRN: [id number]  Principal Diagnosis: MDD, recurrent, severe  Secondary Diagnoses: Active Problems:   Major depressive disorder, recurrent severe without psychotic features (HCC)   Current Medications:  Current Facility-Administered Medications  Medication Dose Route Frequency Provider Last Rate Last Dose  . acetaminophen (TYLENOL) tablet 650 mg  650 mg Oral Q6H PRN Charm RingsLord, Jamison Y, NP      . alum & mag hydroxide-simeth (MAALOX/MYLANTA) 200-200-20 MG/5ML suspension 30 mL  30 mL Oral Q4H PRN Charm RingsLord, Jamison Y, NP      . ARIPiprazole (ABILIFY) tablet 5 mg  5 mg Oral Daily Cobos, Rockey SituFernando A, MD   5 mg at 09/21/17 0847  . citalopram (CELEXA) tablet 10 mg  10 mg Oral Daily Cobos, Rockey SituFernando A, MD   10 mg at 09/21/17 0846  . hydrOXYzine (ATARAX/VISTARIL) tablet 25 mg  25 mg Oral Q6H PRN Cobos, Rockey SituFernando A, MD   25 mg at 09/20/17 2250  . magnesium hydroxide (MILK OF MAGNESIA) suspension 30 mL  30 mL Oral Daily PRN Charm RingsLord, Jamison Y, NP      . nicotine polacrilex (NICORETTE) gum 2 mg  2 mg Oral PRN Cobos, Rockey SituFernando A, MD      . pneumococcal 23 valent vaccine (PNU-IMMUNE) injection 0.5 mL  0.5 mL Intramuscular Tomorrow-1000 Cobos, Fernando A, MD      . traZODone (DESYREL) tablet 50 mg  50 mg Oral QHS PRN Cobos, Rockey SituFernando A, MD   50 mg at 09/20/17 2250   PTA Medications: Medications Prior to Admission  Medication Sig Dispense Refill Last Dose  . ferrous sulfate 325 (65 FE) MG tablet Take 1 tablet (325 mg total) by mouth daily. (Patient not taking: Reported on 09/15/2017) 30 tablet 0 Not Taking at Unknown time  . Multiple Vitamin (MULTIVITAMIN WITH MINERALS) TABS tablet Take 1 tablet by mouth daily.   Past Week at Unknown time  . naproxen sodium (ALEVE) 220 MG tablet Take 440 mg by mouth 2 (two) times daily as needed (pain).   Past Week at Unknown time    Patient Stressors: Financial  difficulties Occupational concerns Substance abuse  Patient Strengths: Average or above average intelligence Wellsite geologistCommunication skills General fund of knowledge Motivation for treatment/growth Physical Health Supportive family/friends Work skills  Treatment Modalities: Medication Management, Group therapy, Case management,  1 to 1 session with clinician, Psychoeducation, Recreational therapy.   Physician Treatment Plan for Primary Diagnosis: MDD, recurrent, severe Long Term Goal(s): Improvement in symptoms so as ready for discharge Improvement in symptoms so as ready for discharge   Short Term Goals: Ability to identify changes in lifestyle to reduce recurrence of condition will improve Ability to identify and develop effective coping behaviors will improve Ability to identify triggers associated with substance abuse/mental health issues will improve  Medication Management: Evaluate patient's response, side effects, and tolerance of medication regimen.  Therapeutic Interventions: 1 to 1 sessions, Unit Group sessions and Medication administration.  Evaluation of Outcomes: Progressing  Physician Treatment Plan for Secondary Diagnosis: Active Problems:   Major depressive disorder, recurrent severe without psychotic features (HCC)  Long Term Goal(s): Improvement in symptoms so as ready for discharge Improvement in symptoms so as ready for discharge   Short Term Goals: Ability to identify changes in lifestyle to reduce recurrence of condition will improve Ability to identify and develop effective coping behaviors will improve Ability to identify triggers associated with substance abuse/mental health issues will improve  Medication Management: Evaluate patient's response, side effects, and tolerance of medication regimen.  Therapeutic Interventions: 1 to 1 sessions, Unit Group sessions and Medication administration.  Evaluation of Outcomes: Progressing   RN Treatment Plan for  Primary Diagnosis: MDD, recurrent, severe Long Term Goal(s): Knowledge of disease and therapeutic regimen to maintain health will improve  Short Term Goals: Ability to remain free from injury will improve, Ability to verbalize feelings will improve and Ability to disclose and discuss suicidal ideas  Medication Management: RN will administer medications as ordered by provider, will assess and evaluate patient's response and provide education to patient for prescribed medication. RN will report any adverse and/or side effects to prescribing provider.  Therapeutic Interventions: 1 on 1 counseling sessions, Psychoeducation, Medication administration, Evaluate responses to treatment, Monitor vital signs and CBGs as ordered, Perform/monitor CIWA, COWS, AIMS and Fall Risk screenings as ordered, Perform wound care treatments as ordered.  Evaluation of Outcomes: Progressing   LCSW Treatment Plan for Primary Diagnosis: MDD, recurrent, severe Long Term Goal(s): Safe transition to appropriate next level of care at discharge, Engage patient in therapeutic group addressing interpersonal concerns.  Short Term Goals: Engage patient in aftercare planning with referrals and resources, Facilitate patient progression through stages of change regarding substance use diagnoses and concerns and Identify triggers associated with mental health/substance abuse issues  Therapeutic Interventions: Assess for all discharge needs, 1 to 1 time with Social worker, Explore available resources and support systems, Assess for adequacy in community support network, Educate family and significant other(s) on suicide prevention, Complete Psychosocial Assessment, Interpersonal group therapy.  Evaluation of Outcomes: Progressing   Progress in Treatment: Attending groups: Yes. Participating in groups: Yes. Taking medication as prescribed: Yes. Toleration medication: Yes. Family/Significant other contact made: No, will contact:   pt's mother. Patient understands diagnosis: Yes. Discussing patient identified problems/goals with staff: Yes. Medical problems stabilized or resolved: Yes. Denies suicidal/homicidal ideation: Yes. Issues/concerns per patient self-inventory: No. Other: n/a   New problem(s) identified: No, Describe:  n/a  New Short Term/Long Term Goal(s): detox, medication management for mood stabilization; elimination of SI thoughts; development of comprehensive mental wellness/sobriety plan.   Discharge Plan or Barriers: CSW assessing for appropriate referrals. Pt would like to follow-up at [facility] and plans to return home at discharge. MHAG pamphlet provided and AA/NA list for [facility] provided to pt for additional community supports.   Reason for Continuation of Hospitalization: Aggression Anxiety Depression Medication stabilization Suicidal ideation Withdrawal symptoms  Estimated Length of Stay: Wed, 09/23/17  Attendees: Patient: 09/21/2017 9:58 AM  Physician: Dr. Altamese [place] MD; Dr. Jama Flavors MD 09/21/2017 9:58 AM  Nursing: Foy [place] RN; Jane RN 09/21/2017 9:58 AM  RN Care Manager:x 09/21/2017 9:58 AM  Social Worker: Chartered loss adjuster, LCSW 09/21/2017 9:58 AM  Recreational Therapist: x 09/21/2017 9:58 AM  Other: Armandina Stammer NP; Feliz Beam Money NP 09/21/2017 9:58 AM  Other:  09/21/2017 9:58 AM  Other: 09/21/2017 9:58 AM    Scribe for Treatment Team: Ledell Peoples Smart, LCSW 09/21/2017 9:58 AM

## 2017-09-21 NOTE — Progress Notes (Signed)
Patient ID: Nicole [name], female   DOB: Oct 27, 1990, 27 y.o.   MRN: [id number]  Pt currently presents with a guarded affect and behavior. Pt can be seen responding to internal stimuli (talking to herself/voices) during assessment. Pt often holds a blanket over her mouth while in common areas. Pt forwards little to writer, reports her day was a good one. Pt reports good sleep with current medication regimen. Reports she was not sleeping well at home PTA.   Pt provided with medications per providers orders. Pt's labs and vitals were monitored throughout the night. Pt given a 1:1 about emotional and mental status. Pt supported and encouraged to express concerns and questions. Pt educated on medications.  Pt's safety ensured with 15 minute and environmental checks. Pt currently denies SI/HI and A/V hallucinations. Pt verbally agrees to seek staff if SI/HI or A/VH occurs and to consult with staff before acting on any harmful thoughts. Will continue POC.

## 2017-09-22 DIAGNOSIS — F1010 Alcohol abuse, uncomplicated: Secondary | ICD-10-CM

## 2017-09-22 MED ORDER — ARIPIPRAZOLE 10 MG PO TABS
10.0000 mg | ORAL_TABLET | Freq: Every day | ORAL | Status: DC
  Administered 2017-09-23 – 2017-09-25 (×3): 10 mg via ORAL
  Filled 2017-09-22 (×6): qty 1

## 2017-09-22 NOTE — Progress Notes (Signed)
Dar Note: Patient presents with flat affect and depressed mood.  Patient appears guarded and preoccupied.  Patient observed covering her mouth with a blanket during assessment.  Denies suicidal thoughts, pain, auditory and visual hallucinations.  Medications given as prescribed.  Patient is visible in the dayroom with no interaction with staff or peers.  Routine safety checks maintained for safety.  Attended group and participated.  Patient offered support and encouragement as needed.  Patient is safe on the unit.

## 2017-09-22 NOTE — Plan of Care (Signed)
Patient is safe and free from injury on the unit.  Routine safety checks maintained Q 15 minutes. 

## 2017-09-22 NOTE — Progress Notes (Signed)
The focus of this group is to help patients review their daily goal of treatment and discuss progress on daily workbooks. Pt attended the evening group session and responded to all discussion prompts from the Writer. Pt told that today started off bad with feelings of anxiety and a panic attack, but that things improved in the afternoon.  "I'm doing better right now. It was a good day otherwise."  Pt rated her overall day a 5 and her affect was appropriate.

## 2017-09-22 NOTE — Progress Notes (Signed)
Nursing Progress Note 1900-0730  D) Patient presents with sullen affect and is guarded/minimal with Clinical research associatewriter. Patient reports her day "started off rocky but got better". Patient declines to discuss this further with Clinical research associatewriter. Patient is observed up in the dayroom but minimally interacts with peers. Patient did attend group. Patient denies SI/HI/AVH or pain. Patient appears to be responding to internal stimuli at times. Patient contracts for safety on the unit. Patient requests PRN trazodone this evening for sleep.  A) Emotional support given. 1:1 interaction and active listening provided. Patient medicated as prescribed. Medications and plan of care reviewed with patient. Patient verbalized understanding without further questions. Snacks and fluids provided. Opportunities for questions or concerns presented to patient. Patient encouraged to continue to work on treatment goals. Labs, vital signs and patient behavior monitored throughout shift. Patient safety maintained with q15 min safety checks. Low fall risk precautions in place and reviewed with patient; patient verbalized understanding.  R) Patient receptive to interaction with nurse. Patient remains safe on the unit at this time. Patient denies any adverse medication reactions at this time. Patient is resting in bed without complaints. Will continue to monitor.

## 2017-09-22 NOTE — Progress Notes (Signed)
Recreation Therapy Notes  Animal-Assisted Activity (AAA) Program Checklist/Progress Notes Patient Eligibility Criteria Checklist & Daily Group note for Rec TxIntervention  Date: 01.15.2019 Time: 2:45pm Location: [address] Dayroom   AAA/T Program Assumption of Risk Form signed by Patient/ or Parent Legal Guardian Yes  Patient is free of allergies or sever asthma Yes  Patient reports no fear of animals Yes  Patient reports no history of cruelty to animals Yes  Patient understands his/her participation is voluntary Yes  Patient washes hands before animal contact Yes  Patient washes hands after animal contact Yes  Behavioral Response: Appropriate   Education:Hand Washing, Appropriate Animal Interaction   Education Outcome: Acknowledges education.   Clinical Observations/Feedback: Patient attended session and interacted appropriately with therapy dog and peers.   Elyana Grabski L Terryn Redner, LRT/CTRS        Iyanni Hepp L 09/22/2017 3:04 PM 

## 2017-09-22 NOTE — [facility] Group Notes (Signed)
Adult Psychoeducational Group Note  Date:  09/22/2017 Time:  4:47 AM  Group Topic/Focus:  Wrap-Up Group:   The focus of this group is to help patients review their daily goal of treatment and discuss progress on daily workbooks.  Participation Level:  Active  Participation Quality:  Appropriate and Attentive  Affect:  Appropriate  Cognitive:  Alert and Appropriate  Insight: Appropriate  Engagement in Group:  Engaged  Modes of Intervention:  Discussion  Additional Comments:  Pt attended and participated in wrap up group. Pt rated their day a 6-7 because they had a "decent day" and their anxiety was not high. Pt goal was to open up more and they did well with it, with the other pt's. A positive noted by the pt was that everyone was friendly and they aren't use to that.   Nicole NettersOctavia A Saher [name] 09/22/2017, 4:47 AM

## 2017-09-22 NOTE — [facility] Group Notes (Signed)
[facility] LCSW Group Therapy Note  Date/Time: 09/22/17, 1315  Type of Therapy/Topic:  Group Therapy:  Feelings about Diagnosis  Participation Level:  Minimal   Mood:pleasant   Description of Group:    This group will allow patients to explore their thoughts and feelings about diagnoses they have received. Patients will be guided to explore their level of understanding and acceptance of these diagnoses. Facilitator will encourage patients to process their thoughts and feelings about the reactions of others to their diagnosis, and will guide patients in identifying ways to discuss their diagnosis with significant others in their lives. This group will be process-oriented, with patients participating in exploration of their own experiences as well as giving and receiving support and challenge from other group members.   Therapeutic Goals: 1. Patient will demonstrate understanding of diagnosis as evidence by identifying two or more symptoms of the disorder:  2. Patient will be able to express two feelings regarding the diagnosis 3. Patient will demonstrate ability to communicate their needs through discussion and/or role plays  Summary of Patient Progress:Pt was able to identify symptoms of her diagnosis.  Pt was not attentive in group, shared regarding current symptoms, but did respond to CSW questions regarding diagnosis.          Therapeutic Modalities:   Cognitive Behavioral Therapy Brief Therapy Feelings Identification   Daleen SquibbGreg Aariana Shankland, LCSW

## 2017-09-22 NOTE — Progress Notes (Signed)
[facility] MD Progress Note  09/22/2017 1:33 PM [place] Se[place]  MRN:  [id number]   Subjective: Nicole [name] reports "   I am feeling a little better today"    Objective: Keiera Strathman is awake, alert and oriented. Seen resting in dayroom coloring. Patient presents with a flat and guarded affect, however is smiling and pleasant. Per staffing notes patient is isolative and was observer responding to internal stimuli.  Denies suicidal or homicidal ideation. Denies auditory or visual hallucination.  Reports she is medication compliant without mediation side effects.  Discussed medication adjustment to the Abilify. Support, encouragement and reassurance was provided.   Principal Problem: Major depressive disorder, recurrent severe without psychotic features (HCC) Diagnosis:   Patient Active Problem List   Diagnosis Date Noted  . Alcohol abuse [F10.10] 09/21/2017  . 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 Active labor [IMO0001] 01/04/2014  . NVD (normal vaginal delivery) [O80] 01/04/2014  . Cesarean delivery, without mention of indication, delivered, with or without mention of antepartum condition [O82] 10/18/2011   Total Time spent with patient: 25 minutes  Past Psychiatric History: See H&P  Past Medical History:  Past Medical History:  Diagnosis Date  . Pregnancy induced hypertension 2011    Past Surgical History:  Procedure Laterality Date  . CESAREAN SECTION  10/14/2011   Procedure: CESAREAN SECTION;  Surgeon: Kathreen Cosier, MD;  Location: [facility];  Service: Gynecology;  Laterality: N/A;  Primary Cesarean Section Delivery Baby Boy @ 0001, Apgars 8/9  . WISDOM TOOTH EXTRACTION  2010   Family History:  Family History  Problem Relation Age of Onset  . Hypertension Mother   . Hypertension Father    Family Psychiatric  History: See H&P Social History:  Social History   Substance and Sexual Activity  Alcohol Use No     Social History    Substance and Sexual Activity  Drug Use No    Social History   Socioeconomic History  . Marital status: Single    Spouse name: None  . Number of children: None  . Years of education: None  . Highest education level: None  Social Needs  . Financial resource strain: None  . Food insecurity - worry: None  . Food insecurity - inability: None  . Transportation needs - medical: None  . Transportation needs - non-medical: None  Occupational History  . None  Tobacco Use  . Smoking status: Current Some Day Smoker    Types: Cigars  . Smokeless tobacco: Never Used  Substance and Sexual Activity  . Alcohol use: No  . Drug use: No  . Sexual activity: Not Currently    Birth control/protection: None  Other Topics Concern  . None  Social History Narrative  . None   Additional Social History:                         Sleep: Good  Appetite:  Fair  Current Medications: Current Facility-Administered Medications  Medication Dose Route Frequency Provider Last Rate Last Dose  . acetaminophen (TYLENOL) tablet 650 mg  650 mg Oral Q6H PRN Charm Rings, NP      . alum & mag hydroxide-simeth (MAALOX/MYLANTA) 200-200-20 MG/5ML suspension 30 mL  30 mL Oral Q4H PRN Charm Rings, NP      . ARIPiprazole (ABILIFY) tablet 5 mg  5 mg Oral Daily Cobos, Rockey Situ, MD   5 mg at 09/22/17 0831  . citalopram (CELEXA)  tablet 10 mg  10 mg Oral Daily Cobos, Rockey Situ, MD   10 mg at 09/22/17 0831  . gabapentin (NEURONTIN) capsule 100 mg  100 mg Oral TID Money, Feliz Beam B, FNP   100 mg at 09/22/17 1159  . hydrOXYzine (ATARAX/VISTARIL) tablet 25 mg  25 mg Oral Q6H PRN Cobos, Rockey Situ, MD   25 mg at 09/21/17 2149  . magnesium hydroxide (MILK OF MAGNESIA) suspension 30 mL  30 mL Oral Daily PRN Charm Rings, NP      . nicotine polacrilex (NICORETTE) gum 2 mg  2 mg Oral PRN Cobos, Rockey Situ, MD      . traZODone (DESYREL) tablet 50 mg  50 mg Oral QHS PRN Cobos, Rockey Situ, MD   50 mg at  09/21/17 2149    Lab Results:  Results for orders placed or performed during the hospital encounter of 09/20/17 (from the past 48 hour(s))  TSH     Status: None   Collection Time: 09/21/17  6:34 AM  Result Value Ref Range   TSH 0.621 0.350 - 4.500 uIU/mL    Comment: Performed by a 3rd Generation assay with a functional sensitivity of <=0.01 uIU/mL. Performed at [facility], [address] [place], [place] [address]   Lipid panel     Status: None   Collection Time: 09/21/17  6:34 AM  Result Value Ref Range   Cholesterol 147 0 - 200 mg/dL   Triglycerides 41 <469 mg/dL   HDL 45 >62 mg/dL   Total CHOL/HDL Ratio 3.3 RATIO   VLDL 8 0 - 40 mg/dL   LDL Cholesterol 94 0 - 99 mg/dL    Comment:        Total Cholesterol/HDL:CHD Risk Coronary Heart Disease Risk Table                     Men   Women  1/2 Average Risk   3.4   3.3  Average Risk       5.0   4.4  2 X Average Risk   9.6   7.1  3 X Average Risk  23.4   11.0        Use the calculated Patient Ratio above and the CHD Risk Table to determine the patient's CHD Risk.        ATP III CLASSIFICATION (LDL):  <100     mg/dL   Optimal  952-841  mg/dL   Near or Above                    Optimal  130-159  mg/dL   Borderline  324-401  mg/dL   High  >027     mg/dL   Very High Performed at [facility], [address], [place], [place] [address]   Hemoglobin A1c     Status: None   Collection Time: 09/21/17  6:34 AM  Result Value Ref Range   Hgb A1c MFr Bld 5.4 4.8 - 5.6 %    Comment: (NOTE) Pre diabetes:          5.7%-6.4% Diabetes:              >6.4% Glycemic control for   <7.0% adults with diabetes    Mean Plasma Glucose 108.28 mg/dL    Comment: Performed at [facility], [address], [place], [place] [address]    Blood Alcohol level:  Lab Results  Component Value Date   ETH <  10 09/20/2017   ETH <10 06/17/2017    Metabolic Disorder Labs: Lab Results  Component Value Date    HGBA1C 5.4 09/21/2017   MPG 108.28 09/21/2017   No results found for: PROLACTIN Lab Results  Component Value Date   CHOL 147 09/21/2017   TRIG 41 09/21/2017   HDL 45 09/21/2017   CHOLHDL 3.3 09/21/2017   VLDL 8 09/21/2017   LDLCALC 94 09/21/2017    Physical Findings: AIMS: Facial and Oral Movements Muscles of Facial Expression: None, normal Lips and Perioral Area: None, normal Jaw: None, normal Tongue: None, normal,Extremity Movements Upper (arms, wrists, hands, fingers): None, normal Lower (legs, knees, ankles, toes): None, normal, Trunk Movements Neck, shoulders, hips: None, normal, Overall Severity Severity of abnormal movements (highest score from questions above): None, normal Incapacitation due to abnormal movements: None, normal Patient's awareness of abnormal movements (rate only patient's report): No Awareness, Dental Status Current problems with teeth and/or dentures?: No Does patient usually wear dentures?: No  CIWA:  CIWA-Ar Total: 0 COWS:     Musculoskeletal: Strength & Muscle Tone: within normal limits Gait & Station: normal Patient leans: N/A  Psychiatric Specialty Exam: Physical Exam  Nursing note and vitals reviewed. Constitutional: She is oriented to person, place, and time. She appears well-developed and well-nourished.  Cardiovascular: Normal rate.  Respiratory: Effort normal.  Musculoskeletal: Normal range of motion.  Neurological: She is alert and oriented to person, place, and time.  Skin: Skin is warm.    Review of Systems  Constitutional: Negative.   HENT: Negative.   Eyes: Negative.   Respiratory: Negative.   Cardiovascular: Negative.   Gastrointestinal: Negative.   Genitourinary: Negative.   Musculoskeletal: Negative.   Skin: Negative.   Neurological: Negative.   Endo/Heme/Allergies: Negative.   Psychiatric/Behavioral: Positive for depression and suicidal ideas. The patient is nervous/anxious.     Blood pressure 117/79, pulse  83, temperature 98.4 F (36.9 C), temperature source Oral, resp. rate 12, height 5\' 5"  (1.651 m), weight 71.7 kg (158 lb).Body mass index is 26.29 kg/m.  General Appearance: Casual  Eye Contact:  Good  Speech:  Clear and Coherent and Normal Rate  Volume:  Normal  Mood:  Depressed  Affect:  Flat  Thought Process:  Coherent and Goal Directed  Orientation:  Full (Time, Place, and Person)  Thought Content:  WDL  Suicidal Thoughts:  No currently denying thought and is able to contract for safety while on the unit  Homicidal Thoughts:  No  Memory:  Immediate;   Good Recent;   Good Remote;   Good  Judgement:  Fair  Insight:  Good  Psychomotor Activity:  Normal  Concentration:  Concentration: Good and Attention Span: Good  Recall:  Good  Fund of Knowledge:  Good  Language:  Good  Akathisia:  No  Handed:  Right  AIMS (if indicated):     Assets:  Communication Skills Desire for Improvement Financial Resources/Insurance Housing Physical Health Social Support Transportation  ADL's:  Intact  Cognition:  WNL  Sleep:  Number of Hours: 6.75   Problems Addressed: MDD severe Alcohol abuse  Treatment Plan Summary: Daily contact with patient to assess and evaluate symptoms and progress in treatment and Medication management   Continue with current treatment plan on 09/22/2016 except where noted   Mood stabilization/ Anxiety:  -Continue  Gabapentin 100 mg PO TID for agitation  -Continue Celexa 10 mg PO Daily for mood stability  -Increased  Abilify 5 to 10 mg  PO Daily for mood  stability  -Continue Vistaril 25 mg Q6H PRN for anxiety Insomina -Continue Trazodone 50 mg PO QHS PRN for insomnia  Will continue to monitor vitals ,medication compliance and treatment side effects while patient is here.  CSW will continue  working on disposition.  Patient to participate in therapeutic milieu  Oneta Rack, NP 09/22/2017, 1:33 PM

## 2017-09-23 DIAGNOSIS — Z560 Unemployment, unspecified: Secondary | ICD-10-CM

## 2017-09-23 DIAGNOSIS — F1994 Other psychoactive substance use, unspecified with psychoactive substance-induced mood disorder: Secondary | ICD-10-CM

## 2017-09-23 DIAGNOSIS — Z599 Problem related to housing and economic circumstances, unspecified: Secondary | ICD-10-CM

## 2017-09-23 MED ORDER — CITALOPRAM HYDROBROMIDE 20 MG PO TABS
20.0000 mg | ORAL_TABLET | Freq: Every day | ORAL | Status: DC
  Administered 2017-09-24 – 2017-09-25 (×2): 20 mg via ORAL
  Filled 2017-09-23 (×4): qty 1

## 2017-09-23 MED ORDER — CITALOPRAM HYDROBROMIDE 10 MG PO TABS
10.0000 mg | ORAL_TABLET | Freq: Once | ORAL | Status: AC
  Administered 2017-09-23: 10 mg via ORAL
  Filled 2017-09-23: qty 1

## 2017-09-23 NOTE — Progress Notes (Signed)
Patient verbalized in group that she had a better day and that she was pleased that the staff offered more groups today. Her goal for tomorrow is to work on not panicking as much. She admits to panicking over nearly every issue that she is dealing with.

## 2017-09-23 NOTE — Progress Notes (Signed)
Patient ID: Nicole [name], female   DOB: 03/19/1991, 27 y.o.   MRN: [id number]  DAR: Pt. Denies SI/HI and A/V Hallucinations during morning assessment. However, endorses SI on her daily inventory sheet. She reports that this is a "sometimes" event. She is able to contract for safety. She reports that her sleep last night was fair, her appetite is fair, her energy level is low, and her concentration is poor. She rates her depression level is 5/10, her hopelessness level is 6/10, and her anxiety level is 7/10. Patient does report pain in her left arm from "a shot." She received a heat pack per request. She reports some blurred vision and lightheadedness this morning but this has since subsided. Support and encouragement provided to the patient. Scheduled medication administered to patient per physician's orders. Patient is minimal but cooperative. She is seen in the milieu intermittently and is attending groups. Q15 minute checks are maintained for safety.

## 2017-09-23 NOTE — [facility] Group Notes (Signed)
[facility] Group Therapy 09/23/2017 1:15pm  Type of Therapy: [facility] Presentation  Participation Level: Active  Participation Quality: Attentive  Affect: Appropriate  Cognitive: Oriented  Insight: Developing/Improving  Engagement in Therapy: Engaged  Modes of Intervention: Discussion, Education and Socialization  Summary of Progress/Problems: [facility] ([facility]HA) Speaker came to talk about his personal journey with mental health. The pt processed ways by which to relate to the speaker. MHA speaker provided handouts and educational information pertaining to groups and services offered by the MHA. Pt was engaged in speaker's presentation and was receptive to resources provided.    Angel Weedon Jon, LCSW 09/23/2017 4:09 PM 

## 2017-09-23 NOTE — Progress Notes (Signed)
Recreation Therapy Notes  Date: 09/23/17 Time: 0930 Location: [address] Dayroom  Group Topic: Stress Management  Goal Area(s) Addresses:  Patient will verbalize importance of using healthy stress management.  Patient will identify positive emotions associated with healthy stress management.   Intervention: Stress Management  Activity :  Meditation.  LRT introduced the stress management group of meditation.  LRT played [name] meditation from the Calm app to participate in the meditation.  Patients were to listen and follow along with the meditation in order to engage in the activity.  Education:  Stress Management, Discharge Planning.   Education Outcome: Acknowledges edcuation/In group clarification offered/Needs additional education  Clinical Observations/Feedback: Pt did not attend group.    Chozen Latulippe, LRT/CTRS         Nicole [name] 09/23/2017 12:35 PM 

## 2017-09-23 NOTE — Progress Notes (Signed)
[facility]  09/23/2017 5:28 PM Nicole [name]  MRN:  [id number] Subjective:    27 y.o AAF, single, lives with her mother and her threechildren, unemployed. Background history of MDD and AUD. Presented to the ER initially for migraine. Represented after discharge and reported she attempted to hang herself with a scarf. Financial constraints as her main issue. No support from her children's father.  Routine labs are essentially normal, toxicology is negative,  UDS is positive for THC , BAL<10 mg/dl.  Chart reviewed today. Patient discussed at team today.  Staff reports that she has been appropriate. She has been participating with unit groups and activities. She has not voiced any suicidal thoughts lately. She has been tolerating her medications well. No behavioral issues. Normal sleep wake cycle. She eats well.   Seen today. Patient says she was fired from her last job over six months ago. Says since then it has been difficult coping. Says her mother and sister has been the only support she has for her children. Patient says she has been considering DNA test from their father so he can pay child support. Patient says after she left the ER, she got a message about being turned down from a job application. Says she was just tired of being turned down over and over. Patient says she just couln not handle the disappointment. Says she used her scarf in a suicide bid. Says she was found and rescued by her mother. Patient says this is the fifth time she has attempted this way. Says her mother just called her and went through the effects suicide would have on the family. Patient says she has now realized it would hurt er children for ever. Says she feels more energized with her medications. She is no longer as down as she was. She is now sleeping more at night. She is eating more. Says she is more optimistic about the future. Her mother is visiting tonight. Says her children are doing well. No current  suicidal thoughts. No infanticidal or homicidal thoughts. No weapons at their house.   Principal Problem: Major depressive disorder, recurrent severe without psychotic features (HCC) Diagnosis:   Patient Active Problem List   Diagnosis Date Noted  . Alcohol abuse [F10.10] 09/21/2017  . 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 Active labor [IMO0001] 01/04/2014  . NVD (normal vaginal delivery) [O80] 01/04/2014  . Cesarean delivery, without mention of indication, delivered, with or without mention of antepartum condition [O82] 10/18/2011   Total Time spent with patient: 20 minutes  Past Psychiatric History: As in H&P  Past Medical History:  Past Medical History:  Diagnosis Date  . Pregnancy induced hypertension 2011    Past Surgical History:  Procedure Laterality Date  . CESAREAN SECTION  10/14/2011   Procedure: CESAREAN SECTION;  Surgeon: Kathreen CosierBernard A Marshall, MD;  Location: [facility];  Service: Gynecology;  Laterality: N/A;  Primary Cesarean Section Delivery Baby Boy @ 0001, Apgars 8/9  . WISDOM TOOTH EXTRACTION  2010   Family History:  Family History  Problem Relation Age of Onset  . Hypertension Mother   . Hypertension Father    Family Psychiatric  History: As in H&P Social History:  Social History   Substance and Sexual Activity  Alcohol Use No     Social History   Substance and Sexual Activity  Drug Use No    Social History   Socioeconomic History  . Marital status: Single  Spouse name: None  . Number of children: None  . Years of education: None  . Highest education level: None  Social Needs  . Financial resource strain: None  . Food insecurity - worry: None  . Food insecurity - inability: None  . Transportation needs - medical: None  . Transportation needs - non-medical: None  Occupational History  . None  Tobacco Use  . Smoking status: Current Some Day Smoker    Types: Cigars  . Smokeless  tobacco: Never Used  Substance and Sexual Activity  . Alcohol use: No  . Drug use: No  . Sexual activity: Not Currently    Birth control/protection: None  Other Topics Concern  . None  Social History Narrative  . None   Additional Social History:                         Sleep: Good  Appetite:  Good  Current Medications: Current Facility-Administered Medications  Medication Dose Route Frequency Provider Last Rate Last Dose  . acetaminophen (TYLENOL) tablet 650 mg  650 mg Oral Q6H PRN Charm Rings, NP      . alum & mag hydroxide-simeth (MAALOX/MYLANTA) 200-200-20 MG/5ML suspension 30 mL  30 mL Oral Q4H PRN Charm Rings, NP      . ARIPiprazole (ABILIFY) tablet 10 mg  10 mg Oral Daily Oneta Rack, NP   10 mg at 09/23/17 0820  . citalopram (CELEXA) tablet 10 mg  10 mg Oral Daily Cobos, Rockey Situ, MD   10 mg at 09/23/17 0820  . gabapentin (NEURONTIN) capsule 100 mg  100 mg Oral TID Money, Feliz Beam B, FNP   100 mg at 09/23/17 1641  . hydrOXYzine (ATARAX/VISTARIL) tablet 25 mg  25 mg Oral Q6H PRN Cobos, Rockey Situ, MD   25 mg at 09/21/17 2149  . magnesium hydroxide (MILK OF MAGNESIA) suspension 30 mL  30 mL Oral Daily PRN Charm Rings, NP      . nicotine polacrilex (NICORETTE) gum 2 mg  2 mg Oral PRN Cobos, Rockey Situ, MD      . traZODone (DESYREL) tablet 50 mg  50 mg Oral QHS PRN Cobos, Rockey Situ, MD   50 mg at 09/22/17 2233    Lab Results: No results found for this or any previous visit (from the past 48 hour(s)).  Blood Alcohol level:  Lab Results  Component Value Date   ETH <10 09/20/2017   ETH <10 06/17/2017    Metabolic Disorder Labs: Lab Results  Component Value Date   HGBA1C 5.4 09/21/2017   MPG 108.28 09/21/2017   No results found for: PROLACTIN Lab Results  Component Value Date   CHOL 147 09/21/2017   TRIG 41 09/21/2017   HDL 45 09/21/2017   CHOLHDL 3.3 09/21/2017   VLDL 8 09/21/2017   LDLCALC 94 09/21/2017    Physical  Findings: AIMS: Facial and Oral Movements Muscles of Facial Expression: None, normal Lips and Perioral Area: None, normal Jaw: None, normal Tongue: None, normal,Extremity Movements Upper (arms, wrists, hands, fingers): None, normal Lower (legs, knees, ankles, toes): None, normal, Trunk Movements Neck, shoulders, hips: None, normal, Overall Severity Severity of abnormal movements (highest score from questions above): None, normal Incapacitation due to abnormal movements: None, normal Patient's awareness of abnormal movements (rate only patient's report): No Awareness, Dental Status Current problems with teeth and/or dentures?: No Does patient usually wear dentures?: No  CIWA:  CIWA-Ar Total: 0 COWS:  Musculoskeletal: Strength & Muscle Tone: within normal limits Gait & Station: normal Patient leans: N/A  Psychiatric Specialty Exam: Physical Exam  Constitutional: She is oriented to person, place, and time. She appears well-developed and well-nourished.  HENT:  Head: Normocephalic and atraumatic.  Respiratory: Effort normal.  Neurological: She is alert and oriented to person, place, and time.  Psychiatric:  As above.     ROS  Blood pressure 107/73, pulse (!) 108, temperature 98.7 F (37.1 C), temperature source Oral, resp. rate 18, height 5\' 5"  (1.651 m), weight 71.7 kg (158 lb).Body mass index is 26.29 kg/m.  General Appearance: In hospital clothing, poor grooming. Calm and cooperative. Good relatedness. Appropriate behavior.   Eye Contact:  Good  Speech:  Clear and Coherent and Normal Rate  Volume:  Normal  Mood:  Less depressed.   Affect:  Restricted mostly  Thought Process:  Linear  Orientation:  Full (Time, Place, and Person)  Thought Content:  More future oriented. Less ruminations on her problems. No delusional theme. No preoccupation with violent thoughts.   No hallucination in any modality.   Suicidal Thoughts:  None lately  Homicidal Thoughts:  No  Memory:   Immediate;   Good Recent;   Good Remote;   Good  Judgement:  Good  Insight:  Good  Psychomotor Activity:  Normal  Concentration:  Concentration: Good and Attention Span: Good  Recall:  Good  Fund of Knowledge:  Good  Language:  Good  Akathisia:  Negative  Handed:    AIMS (if indicated):     Assets:  Communication Skills Desire for Improvement Housing Physical Health Resilience  ADL's:  Limited   Cognition:  WNL  Sleep:  Number of Hours: 6.75     Treatment Plan Summary: Depression is lifting. She is tolerating her medications well. She has been able to process the effects of suicide on her family. Patient is more future oriented. We have agreed to increase her antidepressant medication today. We would get feedback from her mother and evaluate her further. Hopeful discharge in a day or two.    Psychiatric: MDD  SUD  Medical:  Psychosocial:  Financial constraints  Unemployed  PLAN:  1. Increase Citalopram to 20 mg daily 2. Continue to monitor mood, behavior and interaction with peers 3. Feedback from her mother  4. SW would coordinate aftercare   Georgiann Cocker, MD 09/23/2017, 5:28 PM

## 2017-09-23 NOTE — Plan of Care (Signed)
  Progressing Medication: Compliance with prescribed medication regimen will improve 09/23/2017 1803 - Progressing by Lenord Fellersopson, Letesha Klecker Elizabeth, RN

## 2017-09-23 NOTE — [facility] Suicide Risk Assessment (Signed)
[facility] INPATIENT:  Family/Significant Other Suicide Prevention Education  Suicide Prevention Education:  Education Completed; Nicole [name], mother, [phone], has been identified by the patient as the family member/significant other with whom the patient will be residing, and identified as the person(s) who will aid the patient in the event of a mental health crisis (suicidal ideations/suicide attempt).  With written consent from the patient, the family member/significant other has been provided the following suicide prevention education, prior to the and/or following the discharge of the patient.  The suicide prevention education provided includes the following:  Suicide risk factors  Suicide prevention and interventions  National Suicide Hotline telephone number  [facility] assessment telephone number  [facility] Emergency Assistance 911  [facility] and/or [facility] telephone number  Request made of family/significant other to:  Remove weapons (e.g., guns, rifles, knives), all items previously/currently identified as safety concern.  No guns in the home, per [facility].  Remove drugs/medications (over-the-counter, prescriptions, illicit drugs), all items previously/currently identified as a safety concern. Only medications are Nicole [name]'s and she keeps them locked.  The family member/significant other verbalizes understanding of the suicide prevention education information provided.  The family member/significant other agrees to remove the items of safety concern listed above.  Mother was not aware that pt had reached the point of being suicidal prior to her admission and will check in more regularly moving forward.  Nicole [name], Nicole Kincer Jon, LCSW 09/23/2017, 3:14 PM

## 2017-09-23 NOTE — [facility] Suicide Risk Assessment (Signed)
[facility] INPATIENT:  Family/Significant Other Suicide Prevention Education  Suicide Prevention Education:  Contact Attempts:  Donella StadeVeleka McLean, mother, [phone],has been identified by the patient as the family member/significant other with whom the patient will be residing, and identified as the person(s) who will aid the patient in the event of a mental health crisis.  With written consent from the patient, two attempts were made to provide suicide prevention education, prior to and/or following the patient's discharge.  We were unsuccessful in providing suicide prevention education.  A suicide education pamphlet was given to the patient to share with family/significant other.  Date and time of first attempt:09/23/17, 1506 Date and time of second attempt:  [name], Bayler Nehring Jon, LCSW 09/23/2017, 3:06 PM

## 2017-09-23 NOTE — Progress Notes (Signed)
Pt has been observed sitting in the dayroom watching TV.  Pt has minimal interaction with other patients.  She is pleasant and cooperative with Clinical research associatewriter.  She smiles in conversation, and says that she had a good day.  She denies SI/HI/AVH.  She is hopeful that as she continues on her medicine that she will not feel so anxious.  Pt was encouraged to make her needs known to staff.  Pt was offered a sleep aid at bedtime, but she went to bed without coming to ask for it.  Support and encouragement offered.  Discharge plans are in process.  Safety maintained with q15 minute checks.

## 2017-09-24 NOTE — [facility] Group Notes (Signed)
[facility] LCSW Group Therapy Note  Date/Time: 09/24/17, 1315  Type of Therapy/Topic:  Group Therapy:  Balance in Life  Participation Level:  minimal  Description of Group:    This group will address the concept of balance and how it feels and looks when one is unbalanced. Patients will be encouraged to process areas in their lives that are out of balance, and identify reasons for remaining unbalanced. Facilitators will guide patients utilizing problem- solving interventions to address and correct the stressor making their life unbalanced. Understanding and applying boundaries will be explored and addressed for obtaining  and maintaining a balanced life. Patients will be encouraged to explore ways to assertively make their unbalanced needs known to significant others in their lives, using other group members and facilitator for support and feedback.  Therapeutic Goals: 1. Patient will identify two or more emotions or situations they have that consume much of in their lives. 2. Patient will identify signs/triggers that life has become out of balance:  3. Patient will identify two ways to set boundaries in order to achieve balance in their lives:  4. Patient will demonstrate ability to communicate their needs through discussion and/or role plays  Summary of Patient Progress::Pt identified physical, finance and family as current areas that are out of balance but did not participate in group discussion about how to identify and respond positively in these situations.  She did respond to CSW questions.          Therapeutic Modalities:   Cognitive Behavioral Therapy Solution-Focused Therapy Assertiveness Training  Daleen SquibbGreg Isidra Mings, Kentucky[place]CS[place]

## 2017-09-24 NOTE — Progress Notes (Signed)
Adult Psychoeducational Group Note  Date:  09/24/2017 Time:  0915 Group Topic/Focus:  TED talk on vulnerability   Participation Level:  Minimal  Participation Quality:  Appropriate and Attentive  Affect:  Appropriate  Cognitive:  Appropriate  Insight: None  Engagement in Group:  Lacking  Modes of Intervention:  Discussion, Education and Support  Additional Comments:  Pt attended group but did not engage in discussion   Gwenevere Ghazili, Tineshia Becraft Patience 09/24/2017, 12:15 PM

## 2017-09-24 NOTE — Progress Notes (Signed)
[facility] MD Progress Note  09/24/2017 6:19 PM Makesha Belitz  MRN:  [id number] Subjective:    27 y.o AAF, single, lives with her mother and her threechildren, unemployed. Background history of MDD and AUD. Presented to the ER initially for migraine. Represented after discharge and reported she attempted to hang herself with a scarf. Financial constraints as her main issue. No support from her children's father.  Routine labs are essentially normal, toxicology is negative,  UDS is positive for THC , BAL<10 mg/dl.  Chart reviewed today. Patient discussed at team today.  Staff reports that she has been appropriate. She has been participating with unit groups and activities. She has not voiced any suicidal thoughts lately. She has been tolerating her medications well. No behavioral issues. Normal sleep wake cycle. She eats well.   Seen today. Feels better. Says her family remains supportive. She hopes to be home tomorrow. No linger having any futility thoughts. More optimistic about the future. Says she knows improving her financial situation would get her to hundred percent.   Principal Problem: Major depressive disorder, recurrent severe without psychotic features (HCC) Diagnosis:   Patient Active Problem List   Diagnosis Date Noted  . Alcohol abuse [F10.10] 09/21/2017  . 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 Active labor [IMO0001] 01/04/2014  . NVD (normal vaginal delivery) [O80] 01/04/2014  . Cesarean delivery, without mention of indication, delivered, with or without mention of antepartum condition [O82] 10/18/2011   Total Time spent with patient: 20 minutes  Past Psychiatric History: As in H&P  Past Medical History:  Past Medical History:  Diagnosis Date  . Pregnancy induced hypertension 2011    Past Surgical History:  Procedure Laterality Date  . CESAREAN SECTION  10/14/2011   Procedure: CESAREAN SECTION;  Surgeon: Kathreen Cosier, MD;  Location: [facility];  Service: Gynecology;  Laterality: N/A;  Primary Cesarean Section Delivery Baby Boy @ 0001, Apgars 8/9  . WISDOM TOOTH EXTRACTION  2010   Family History:  Family History  Problem Relation Age of Onset  . Hypertension Mother   . Hypertension Father    Family Psychiatric  History: As in H&P Social History:  Social History   Substance and Sexual Activity  Alcohol Use No     Social History   Substance and Sexual Activity  Drug Use No    Social History   Socioeconomic History  . Marital status: Single    Spouse name: None  . Number of children: None  . Years of education: None  . Highest education level: None  Social Needs  . Financial resource strain: None  . Food insecurity - worry: None  . Food insecurity - inability: None  . Transportation needs - medical: None  . Transportation needs - non-medical: None  Occupational History  . None  Tobacco Use  . Smoking status: Current Some Day Smoker    Types: Cigars  . Smokeless tobacco: Never Used  Substance and Sexual Activity  . Alcohol use: No  . Drug use: No  . Sexual activity: Not Currently    Birth control/protection: None  Other Topics Concern  . None  Social History Narrative  . None   Additional Social History:                         Sleep: Good  Appetite:  Good  Current Medications: Current Facility-Administered Medications  Medication Dose Route Frequency Provider Last Rate Last  Dose  . acetaminophen (TYLENOL) tablet 650 mg  650 mg Oral Q6H PRN Charm Rings, NP      . alum & mag hydroxide-simeth (MAALOX/MYLANTA) 200-200-20 MG/5ML suspension 30 mL  30 mL Oral Q4H PRN Charm Rings, NP      . ARIPiprazole (ABILIFY) tablet 10 mg  10 mg Oral Daily Oneta Rack, NP   10 mg at 09/24/17 0813  . citalopram (CELEXA) tablet 20 mg  20 mg Oral Daily Dima Ferrufino, Delight Ovens, MD   20 mg at 09/24/17 0813  . gabapentin (NEURONTIN) capsule 100 mg  100 mg Oral TID Money,  Feliz Beam B, FNP   100 mg at 09/24/17 1654  . hydrOXYzine (ATARAX/VISTARIL) tablet 25 mg  25 mg Oral Q6H PRN Cobos, Rockey Situ, MD   25 mg at 09/21/17 2149  . magnesium hydroxide (MILK OF MAGNESIA) suspension 30 mL  30 mL Oral Daily PRN Charm Rings, NP      . nicotine polacrilex (NICORETTE) gum 2 mg  2 mg Oral PRN Cobos, Rockey Situ, MD      . traZODone (DESYREL) tablet 50 mg  50 mg Oral QHS PRN Cobos, Rockey Situ, MD   50 mg at 09/22/17 2233    Lab Results: No results found for this or any previous visit (from the past 48 hour(s)).  Blood Alcohol level:  Lab Results  Component Value Date   ETH <10 09/20/2017   ETH <10 06/17/2017    Metabolic Disorder Labs: Lab Results  Component Value Date   HGBA1C 5.4 09/21/2017   MPG 108.28 09/21/2017   No results found for: PROLACTIN Lab Results  Component Value Date   CHOL 147 09/21/2017   TRIG 41 09/21/2017   HDL 45 09/21/2017   CHOLHDL 3.3 09/21/2017   VLDL 8 09/21/2017   LDLCALC 94 09/21/2017    Physical Findings: AIMS: Facial and Oral Movements Muscles of Facial Expression: None, normal Lips and Perioral Area: None, normal Jaw: None, normal Tongue: None, normal,Extremity Movements Upper (arms, wrists, hands, fingers): None, normal Lower (legs, knees, ankles, toes): None, normal, Trunk Movements Neck, shoulders, hips: None, normal, Overall Severity Severity of abnormal movements (highest score from questions above): None, normal Incapacitation due to abnormal movements: None, normal Patient's awareness of abnormal movements (rate only patient's report): No Awareness, Dental Status Current problems with teeth and/or dentures?: No Does patient usually wear dentures?: No  CIWA:  CIWA-Ar Total: 0 COWS:     Musculoskeletal: Strength & Muscle Tone: within normal limits Gait & Station: normal Patient leans: N/A  Psychiatric Specialty Exam: Physical Exam  Constitutional: She is oriented to person, place, and time. She appears  well-developed and well-nourished.  HENT:  Head: Normocephalic and atraumatic.  Respiratory: Effort normal.  Neurological: She is alert and oriented to person, place, and time.  Psychiatric:  As above.     ROS  Blood pressure 110/77, pulse 86, temperature 98.7 F (37.1 C), temperature source Oral, resp. rate 16, height 5\' 5"  (1.651 m), weight 71.7 kg (158 lb).Body mass index is 26.29 kg/m.  General Appearance: pleasant, calm and cooperative. Appropriate behavior  Eye Contact:  Good  Speech:  Clear and Coherent and Normal Rate  Volume:  Normal  Mood:  Euthymic     Affect:  Mobilizing some positive affect.   Thought Process:  Linear  Orientation:  Full (Time, Place, and Person)  Thought Content:  No delusional theme. No preoccupation with violent thoughts. No negative ruminations. No obsession.  No hallucination  in any modality.   Suicidal Thoughts:  None   Homicidal Thoughts:  No  Memory:  Immediate;   Good Recent;   Good Remote;   Good  Judgement:  Good  Insight:  Good  Psychomotor Activity:  Normal  Concentration:  Concentration: Good and Attention Span: Good  Recall:  Good  Fund of Knowledge:  Good  Language:  Good  Akathisia:  Negative  Handed:    AIMS (if indicated):     Assets:  Communication Skills Desire for Improvement Housing Physical Health Resilience  ADL's:  Limited   Cognition:  WNL  Sleep:  Number of Hours: 6.75     Treatment Plan Summary: Patient is tolerating recent medication adjustment well. She has maintained improvement. She is more motivated to get a job. No lingering suicidal thoughts. Hopeful discharge tomorrow.     Psychiatric: MDD  SUD  Medical:  Psychosocial:  Financial constraints  Unemployed  PLAN:  1. Continue medications at current dose 2. Continue to monitor mood, behavior and interaction with peers  Georgiann CockerVincent A Lizbeth Feijoo, MD 09/24/2017, 6:19 PMPatient ID: Lora PaulaKashia Trindade, female   DOB: 03-Jun-1991, 27 y.o.   MRN: [id number]

## 2017-09-24 NOTE — Progress Notes (Signed)
Nursing Progress Note 1900-0730  D) Patient presents with flat affect and depressed mood. Patient did attend group. Patient is minimal with writer this evening and denies concerns. Patient reports she has been sleeping "fair" but with "a little tossing and turning". Patient reports she will request sleep medication if needed. Patient denies SI/HI/AVH or pain. Patient contracts for safety on the unit.  A) Emotional support given. 1:1 interaction and active listening provided. Medications and plan of care reviewed with patient. Patient verbalized understanding without further questions. Snacks and fluids provided. Opportunities for questions or concerns presented to patient. Patient encouraged to continue to work on treatment goals. Labs, vital signs and patient behavior monitored throughout shift. Patient safety maintained with q15 min safety checks. Low fall risk precautions in place and reviewed with patient; patient verbalized understanding.  R) Patient receptive to interaction with nurse. Patient remains safe on the unit at this time. Patient denies any adverse medication reactions at this time. Patient is resting in bed without complaints. Will continue to monitor.

## 2017-09-24 NOTE — Progress Notes (Signed)
Pt presents with a flat affect and depressed mood. Pt noted to have minimal interaction on the unit. Pt reports feeling dizzy on approach. Pt given Gatorade and verbalized decreased dizziness. Pt b/p stable.Pt rates depression 3/10. Anxiety 6/10. Pt denies SI. Pt reports decreased auditory hallucinations. Pt appears to have some thought blocking and internal stimuli. Medications reviewed with pt. Medications administered as ordered per MD. Verbal support provided. Pt encouraged to attend groups. 15 minute checks performed for safety. Pt compliant with tx.

## 2017-09-25 MED ORDER — GABAPENTIN 100 MG PO CAPS: 100.0000 mg | ORAL_CAPSULE | Freq: Three times a day (TID) | ORAL | 0 refills | Status: DC

## 2017-09-25 MED ORDER — HYDROXYZINE HCL 25 MG PO TABS: 25.0000 mg | ORAL_TABLET | Freq: Four times a day (QID) | ORAL | 0 refills | Status: DC | PRN

## 2017-09-25 MED ORDER — TRAZODONE HCL 50 MG PO TABS: 50.0000 mg | ORAL_TABLET | Freq: Every evening | ORAL | 0 refills | Status: DC | PRN

## 2017-09-25 MED ORDER — CITALOPRAM HYDROBROMIDE 20 MG PO TABS: 20.0000 mg | ORAL_TABLET | Freq: Every day | ORAL | 0 refills | Status: DC

## 2017-09-25 MED ORDER — ARIPIPRAZOLE 10 MG PO TABS: 10.0000 mg | ORAL_TABLET | Freq: Every day | ORAL | 0 refills | Status: DC

## 2017-09-25 NOTE — Tx Team (Signed)
Interdisciplinary Treatment and Diagnostic Plan Update  09/25/2017 Time of Session: [id number] Nicole [name] MRN: [id number]  Principal Diagnosis: MDD, recurrent, severe  Secondary Diagnoses: Principal Problem:   Major depressive disorder, recurrent severe without psychotic features (HCC) Active Problems:   Alcohol abuse   Current Medications:  Current Facility-Administered Medications  Medication Dose Route Frequency Provider Last Rate Last Dose  . acetaminophen (TYLENOL) tablet 650 mg  650 mg Oral Q6H PRN Charm Rings, NP      . alum & mag hydroxide-simeth (MAALOX/MYLANTA) 200-200-20 MG/5ML suspension 30 mL  30 mL Oral Q4H PRN Charm Rings, NP      . ARIPiprazole (ABILIFY) tablet 10 mg  10 mg Oral Daily Oneta Rack, NP   10 mg at 09/25/17 0809  . citalopram (CELEXA) tablet 20 mg  20 mg Oral Daily Izediuno, Delight Ovens, MD   20 mg at 09/25/17 0809  . gabapentin (NEURONTIN) capsule 100 mg  100 mg Oral TID Money, Gerlene Burdock, FNP   100 mg at 09/25/17 0809  . hydrOXYzine (ATARAX/VISTARIL) tablet 25 mg  25 mg Oral Q6H PRN Cobos, Rockey Situ, MD   25 mg at 09/21/17 2149  . magnesium hydroxide (MILK OF MAGNESIA) suspension 30 mL  30 mL Oral Daily PRN Charm Rings, NP      . nicotine polacrilex (NICORETTE) gum 2 mg  2 mg Oral PRN Cobos, Rockey Situ, MD      . traZODone (DESYREL) tablet 50 mg  50 mg Oral QHS PRN Cobos, Rockey Situ, MD   50 mg at 09/22/17 2233   PTA Medications: Medications Prior to Admission  Medication Sig Dispense Refill Last Dose  . ferrous sulfate 325 (65 FE) MG tablet Take 1 tablet (325 mg total) by mouth daily. (Patient not taking: Reported on 09/15/2017) 30 tablet 0 Not Taking at Unknown time  . Multiple Vitamin (MULTIVITAMIN WITH MINERALS) TABS tablet Take 1 tablet by mouth daily.   Past Week at Unknown time  . naproxen sodium (ALEVE) 220 MG tablet Take 440 mg by mouth 2 (two) times daily as needed (pain).   Past Week at Unknown time    Patient Stressors: Financial  difficulties Occupational concerns Substance abuse  Patient Strengths: Average or above average intelligence Wellsite geologist fund of knowledge Motivation for treatment/growth Physical Health Supportive family/friends Work skills  Treatment Modalities: Medication Management, Group therapy, Case management,  1 to 1 session with clinician, Psychoeducation, Recreational therapy.   Physician Treatment Plan for Primary Diagnosis: MDD, recurrent, severe Long Term Goal(s): Improvement in symptoms so as ready for discharge Improvement in symptoms so as ready for discharge   Short Term Goals: Ability to identify changes in lifestyle to reduce recurrence of condition will improve Ability to identify and develop effective coping behaviors will improve Ability to identify triggers associated with substance abuse/mental health issues will improve  Medication Management: Evaluate patient's response, side effects, and tolerance of medication regimen.  Therapeutic Interventions: 1 to 1 sessions, Unit Group sessions and Medication administration.  Evaluation of Outcomes: Progressing  Physician Treatment Plan for Secondary Diagnosis: Principal Problem:   Major depressive disorder, recurrent severe without psychotic features (HCC) Active Problems:   Alcohol abuse  Long Term Goal(s): Improvement in symptoms so as ready for discharge Improvement in symptoms so as ready for discharge   Short Term Goals: Ability to identify changes in lifestyle to reduce recurrence of condition will improve Ability to identify and develop effective coping behaviors will improve Ability to identify triggers  associated with substance abuse/mental health issues will improve     Medication Management: Evaluate patient's response, side effects, and tolerance of medication regimen.  Therapeutic Interventions: 1 to 1 sessions, Unit Group sessions and Medication administration.  Evaluation of Outcomes:  Progressing   RN Treatment Plan for Primary Diagnosis: MDD, recurrent, severe Long Term Goal(s): Knowledge of disease and therapeutic regimen to maintain health will improve  Short Term Goals: Ability to remain free from injury will improve, Ability to verbalize feelings will improve and Ability to disclose and discuss suicidal ideas  Medication Management: RN will administer medications as ordered by provider, will assess and evaluate patient's response and provide education to patient for prescribed medication. RN will report any adverse and/or side effects to prescribing provider.  Therapeutic Interventions: 1 on 1 counseling sessions, Psychoeducation, Medication administration, Evaluate responses to treatment, Monitor vital signs and CBGs as ordered, Perform/monitor CIWA, COWS, AIMS and Fall Risk screenings as ordered, Perform wound care treatments as ordered.  Evaluation of Outcomes: Progressing   LCSW Treatment Plan for Primary Diagnosis: MDD, recurrent, severe Long Term Goal(s): Safe transition to appropriate next level of care at discharge, Engage patient in therapeutic group addressing interpersonal concerns.  Short Term Goals: Engage patient in aftercare planning with referrals and resources, Facilitate patient progression through stages of change regarding substance use diagnoses and concerns and Identify triggers associated with mental health/substance abuse issues  Therapeutic Interventions: Assess for all discharge needs, 1 to 1 time with Social worker, Explore available resources and support systems, Assess for adequacy in community support network, Educate family and significant other(s) on suicide prevention, Complete Psychosocial Assessment, Interpersonal group therapy.  Evaluation of Outcomes: Progressing   Progress in Treatment: Attending groups: Yes. Participating in groups: Yes. Taking medication as prescribed: Yes. Toleration medication: Yes. Family/Significant  other contact made: Yes:  pt's mother. Patient understands diagnosis: Yes. Discussing patient identified problems/goals with staff: Yes. Medical problems stabilized or resolved: Yes. Denies suicidal/homicidal ideation: Yes. Issues/concerns per patient self-inventory: No. Other: n/a   New problem(s) identified: No, Describe:  n/a  New Short Term/Long Term Goal(s): detox, medication management for mood stabilization; elimination of SI thoughts; development of comprehensive mental wellness/sobriety plan.   Discharge Plan or Barriers: CSW assessing for appropriate referrals. [facility] and plans to return home at discharge. MHAG pamphlet provided and [place] provided to [place] for additional community supports.   Reason for Continuation of Hospitalization: Aggression Anxiety Depression Medication stabilization Suicidal ideation Withdrawal symptoms  Estimated Length of Stay: Wed, 09/23/17  Attendees: Patient: 09/25/2017 10:22 AM  Physician: Dr. Jackquline BerlinIzediuno, MD 09/25/2017 10:22 AM  Nursing: Shelda JakesPatty Duke, RN 09/25/2017 10:22 AM  RN Care Manager:x 09/25/2017 10:22 AM  Social Worker: Daleen SquibbGreg Aydrien Froman, LCSW 09/25/2017 10:22 AM  Recreational Therapist: x 09/25/2017 10:22 AM  Other:  09/25/2017 10:22 AM  Other:  09/25/2017 10:22 AM  Other: 09/25/2017 10:22 AM    Scribe for Treatment Team: [name], Chelisa Hennen Jon, LCSW 09/25/2017 10:22 AM

## 2017-09-25 NOTE — [facility] Suicide Risk Assessment (Signed)
[facility] Discharge Suicide Risk Assessment   Principal Problem: Major depressive disorder, recurrent severe without psychotic features [facility]) Discharge Diagnoses:  Patient Active Problem List   Diagnosis Date Noted  . Alcohol abuse [F10.10] 09/21/2017  . 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 Active labor [IMO0001] 01/04/2014  . NVD (normal vaginal delivery) [O80] 01/04/2014  . Cesarean delivery, without mention of indication, delivered, with or without mention of antepartum condition [O82] 10/18/2011    Total Time spent with patient: 45 minutes  Musculoskeletal: Strength & Muscle Tone: within normal limits Gait & Station: normal Patient leans: N/A  Psychiatric Specialty Exam: Review of Systems  Constitutional: Negative.   HENT: Negative.   Eyes: Negative.   Respiratory: Negative.   Cardiovascular: Negative.   Gastrointestinal: Negative.   Genitourinary: Negative.   Musculoskeletal: Negative.   Skin: Negative.   Neurological: Negative.   Endo/Heme/Allergies: Negative.   Psychiatric/Behavioral: Negative for depression, hallucinations, memory loss, substance abuse and suicidal ideas. The patient is not nervous/anxious and does not have insomnia.     Blood pressure 111/76, pulse 79, temperature 98.8 F (37.1 C), temperature source Oral, resp. rate 18, height 5\' 5"  (1.651 m), weight 71.7 kg (158 lb).Body mass index is 26.29 kg/m.  General Appearance: Neatly dressed, pleasant, engaging well and cooperative. Appropriate behavior. Not in any distress. Good relatedness. Not internally stimulated.  Eye Contact::  Good  Speech:  Spontaneous, normal prosody. Normal tone and rate.   Volume:  Normal  Mood:  Euthymic  Affect:  Appropriate and Full Range  Thought Process:  Linear  Orientation:  Full (Time, Place, and Person)  Thought Content:  Future oriented. No delusional theme. No preoccupation with violent thoughts. No negative  ruminations. No obsession.  No hallucination in any modality.   Suicidal Thoughts:  No  Homicidal Thoughts:  No  Memory:  Immediate;   Good Recent;   Good Remote;   Good  Judgement:  Good  Insight:  Good  Psychomotor Activity:  Normal  Concentration:  Good  Recall:  Good  Fund of Knowledge:Good  Language: Good  Akathisia:  Negative  Handed:    AIMS (if indicated):     Assets:  Communication Skills Desire for Improvement Housing Physical Health Resilience Transportation  Sleep:  Number of Hours: 6.75  Cognition: WNL  ADL's:  Intact   Clinical Assessment::   27 y.o AAF, single, lives with her mother and her threechildren, unemployed. Background history of MDD and AUD. Presented to the ER initially for migraine. Represented after discharge and reported she attempted to hang herself with a scarf. Financial constraints as her main issue. No support from her children's father.  Routine labs are essentially normal, toxicology is negative,  UDS is positive for THC , BAL<10 mg/dl.  Seen today. Reports thats he is in good spirits. Depression has improved. Reports normal energy and interest. She has been maintaining normal biological functions. She is able to think clearly. She is able to focus on task. Her thoughts are not crowded or racing. No evidence of mania. No hallucination in any modality. She is not making any delusional statement. No passivity of will/thought. She is fully in touch with reality. No thoughts of suicide. No thoughts of homicide. No violent thoughts. No overwhelming anxiety.  Nursing staff reports that patient has been appropriate on the unit. Patient has been interacting well with peers. No behavioral issues. Patient has not voiced any suicidal thoughts. Patient has not been observed to be internally  stimulated. Patient has been adherent with treatment recommendations. Patient has been tolerating their medication well.   Patient was discussed at team. Team members feels  that patient is back to her baseline level of function. Team agrees with plan to discharge patient today.    Demographic Factors:  Low socioeconomic status and Unemployed  Loss Factors: Financial problems/change in socioeconomic status  Historical Factors: Impulsivity  Risk Reduction Factors:   Sense of responsibility to family, Living with another person, especially a relative, Positive social support, Positive therapeutic relationship and Positive coping skills or problem solving skills  Continued Clinical Symptoms:  As above  Cognitive Features That Contribute To Risk:  None    Suicide Risk:  Minimal: No identifiable suicidal ideation. Patient is not having any thoughts of suicide at this time. Modifiable risk factors targeted during this admission includes depression. Demographical and historical risk factors cannot be modified. Patient is now engaging well. Patient is reliable and is future oriented. We have buffered patient's support structures. At this point, patient is at low risk of suicide. Patient is aware of the effects of psychoactive substances on decision making process. Patient has been provided with emergency contacts. Patient acknowledges to use resources provided if unforseen circumstances changes their current risk stratification.    Follow-up Information    [facility] Follow up on 10/01/2017.   Specialty:  Behavioral Health Why:  Hospital follow-up on Thursday 10/01/17 at 9:15AM. Please bring: photo ID, social security card, medicaid card, and hospital discharge paperwork. Thank you.  Contact information: [address] [place] [address] [phone]           Plan Of Care/Follow-up recommendations:  1. Continue current psychotropic medications 2. Mental health and addiction follow up as arranged.  3. Discharge in care of her family 4. Provided limited quantity of prescriptions   Georgiann CockerVincent A Izediuno, MD 09/25/2017, 9:55 AM

## 2017-09-25 NOTE — Plan of Care (Signed)
  Completed/Met Activity: Interest or engagement in activities will improve 09/25/2017 1355 - Completed/Met by Joice Lofts, RN Sleeping patterns will improve 09/25/2017 1355 - Completed/Met by Joice Lofts, RN Education: Knowledge of [place] Education information/materials will improve 09/25/2017 1355 - Completed/Met by Joice Lofts, RN Emotional status will improve 09/25/2017 1355 - Completed/Met by Joice Lofts, RN Mental status will improve 09/25/2017 1355 - Completed/Met by Joice Lofts, RN Verbalization of understanding the information provided will improve 09/25/2017 1355 - Completed/Met by Joice Lofts, RN Coping: Ability to verbalize frustrations and anger appropriately will improve 09/25/2017 1355 - Completed/Met by Joice Lofts, RN Ability to demonstrate self-control will improve 09/25/2017 1355 - Completed/Met by Joice Lofts, Mina Behavior/Discharge Planning: Identification of resources available to assist in meeting health care needs will improve 09/25/2017 1355 - Completed/Met by Joice Lofts, RN Compliance with treatment plan for underlying cause of condition will improve 09/25/2017 1355 - Completed/Met by Joice Lofts, RN Physical Regulation: Ability to maintain clinical measurements within normal limits will improve 09/25/2017 1355 - Completed/Met by Joice Lofts, RN Safety: Periods of time without injury will increase 09/25/2017 1355 - Completed/Met by Joice Lofts, RN Education: Ability to make informed decisions regarding treatment will improve 09/25/2017 1355 - Completed/Met by Joice Lofts, RN Coping: Ability to cope will improve 09/25/2017 1355 - Completed/Met by Joice Lofts, RN Medication: Compliance with prescribed medication regimen will improve 09/25/2017 1355 - Completed/Met by Joice Lofts, RN Education: Utilization of techniques to improve  thought processes will improve 09/25/2017 1355 - Completed/Met by Joice Lofts, RN Coping: Ability to cope will improve 09/25/2017 1355 - Completed/Met by Joice Lofts, RN Spiritual Needs Ability to function at adequate level 09/25/2017 1355 - Completed/Met by Joice Lofts, RN

## 2017-09-25 NOTE — Progress Notes (Addendum)
Nutrition Education Note  Pt attended group focusing on general, healthful nutrition education.  RD emphasized the importance of eating regular meals and snacks throughout the day. Consuming sugar-free beverages and incorporating fruits and vegetables into diet when possible. Provided examples of healthy snacks. Patient encouraged to leave group with a goal to improve nutrition/healthy eating.   Diet Order: Diet Heart Room service appropriate? Yes; Fluid consistency: Thin Pt is also offered choice of unit snacks mid-morning and mid-afternoon.  Pt is eating as desired.   If additional nutrition issues arise, please consult RD.    [place] Souter, MS, RD, LDN, CNSC Inpatient Clinical Dietitian Pager # [phone] After hours/weekend pager # [phone]     

## 2017-09-25 NOTE — Progress Notes (Signed)
Discharge note:  Patient discharged home per MD order.  Reviewed AVS/transition record with patient and she indicated understanding.  [facility]  Patient received prescriptions of her medications.  She denies any thoughts of self harm.  Patient received all personal belongs from unit and locker.  Patient left ambulatory with her mother.

## 2017-09-25 NOTE — Progress Notes (Signed)
D.  Pt discharged home with mother, all belongings returned from locker.  Pt received completed discharge packet and denied questions at this time.  Pt denies suicidal ideation.  A.  Pt and mother escorted to lobby after picking up patient's belongings.  R.  Pt stated she was pleased to be going home and felt hopeful for her future.

## 2017-09-25 NOTE — Progress Notes (Signed)
  [facility] :  Will you be returning to the same living situation after discharge:  Yes,  with mother At discharge, do you have transportation home?: Yes,  mother Do you have the ability to pay for your medications: Yes,  medicaid  Release of information consent forms completed and in the chart;  Patient's signature needed at discharge.  Patient to Follow up at: Follow-up Information    [facility] Follow up on 10/01/2017.   Specialty:  Behavioral Health Why:  Hospital follow-up on Thursday 10/01/17 at 9:15AM. Please bring: photo ID, social security card, medicaid card, and hospital discharge paperwork. Thank you.  Contact information: [address] [place] [address] [phone]           Next level of care provider [facility] Link:no  Safety Planning and Suicide Prevention discussed: Yes,  with mother  Have you used any form of tobacco in the last 30 days? (Cigarettes, Smokeless Tobacco, Cigars, and/or Pipes): Yes  Has patient been referred to the Quitline?: Yes, faxed on 09/25/17  Patient has been referred for addiction treatment: Yes  [name], Vinicio Lynk Jon, LCSW 09/25/2017, 12:11 PM

## 2017-09-25 NOTE — [facility] Group Notes (Signed)
  [facility] LCSW Group Therapy Note  Date/Time: 09/25/17, 1315  Type of Therapy/Topic:  Group Therapy:  Emotion Regulation  Participation Level:  Minimal   Mood:pleasant  Description of Group:    The purpose of this group is to assist patients in learning to regulate negative emotions and experience positive emotions. Patients will be guided to discuss ways in which they have been vulnerable to their negative emotions. These vulnerabilities will be juxtaposed with experiences of positive emotions or situations, and patients challenged to use positive emotions to combat negative ones. Special emphasis will be placed on coping with negative emotions in conflict situations, and patients will process healthy conflict resolution skills.  Therapeutic Goals: 1. Patient will identify two positive emotions or experiences to reflect on in order to balance out negative emotions:  2. Patient will label two or more emotions that they find the most difficult to experience:  3. Patient will be able to demonstrate positive conflict resolution skills through discussion or role plays:   Summary of Patient Progress: Pt identified "heartbreak" as an emotion that is difficult for her to experience.  Pt minimally participated in discussion regarding positive ways to handle difficult emotions.        Therapeutic Modalities:   Cognitive Behavioral Therapy Feelings Identification Dialectical Behavioral Therapy  Daleen SquibbGreg Rakan Soffer, LCSW

## 2017-09-25 NOTE — Progress Notes (Signed)
Recreation Therapy Notes  Date: 09/25/17 Time: 0930 Location: 300 [address] Dayroom  Group Topic: Stress Management  Goal Area(s) Addresses:  Patient will verbalize importance of using healthy stress management.  Patient will identify positive emotions associated with healthy stress management.   Behavioral Response: Engaged  Intervention: Stress Management  Activity :  Meditation.  LRT introduced the stress management technique of meditation.  LRT played a meditation dealing with gratitude.  Patients were to listen and follow along as the meditation played.  Education:  Stress Management, Discharge Planning.   Education Outcome: Acknowledges edcuation/In group clarification offered/Needs additional education  Clinical Observations/Feedback: Pt attended group.    Cathleen Yagi, LRT/CTRS         Darron Stuck A 09/25/2017 1:12 PM 
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
## 2017-09-26 ENCOUNTER — Other Ambulatory Visit: Payer: Self-pay

## 2017-09-26 ENCOUNTER — Inpatient Hospital Stay (HOSPITAL_COMMUNITY)
Admission: RE | Admit: 2017-09-26 | Discharge: 2017-09-30 | DRG: 885 | Disposition: A | Discharge status: Home / Self Care | Payer: Medicaid Other | Attending: Psychiatry | Admitting: Psychiatry

## 2017-09-26 ENCOUNTER — Encounter (HOSPITAL_COMMUNITY): Payer: Self-pay | Admitting: Nurse Practitioner

## 2017-09-26 DIAGNOSIS — F332 Major depressive disorder, recurrent severe without psychotic features: Secondary | ICD-10-CM | POA: Diagnosis not present

## 2017-09-26 DIAGNOSIS — Z8249 Family history of ischemic heart disease and other diseases of the circulatory system: Secondary | ICD-10-CM

## 2017-09-26 DIAGNOSIS — Z915 Personal history of self-harm: Secondary | ICD-10-CM | POA: Diagnosis not present

## 2017-09-26 DIAGNOSIS — F323 Major depressive disorder, single episode, severe with psychotic features: Secondary | ICD-10-CM | POA: Diagnosis present

## 2017-09-26 DIAGNOSIS — F1010 Alcohol abuse, uncomplicated: Secondary | ICD-10-CM | POA: Diagnosis not present

## 2017-09-26 DIAGNOSIS — F333 Major depressive disorder, recurrent, severe with psychotic symptoms: Secondary | ICD-10-CM

## 2017-09-26 DIAGNOSIS — F419 Anxiety disorder, unspecified: Secondary | ICD-10-CM | POA: Diagnosis not present

## 2017-09-26 DIAGNOSIS — Z599 Problem related to housing and economic circumstances, unspecified: Secondary | ICD-10-CM | POA: Diagnosis not present

## 2017-09-26 DIAGNOSIS — G4700 Insomnia, unspecified: Secondary | ICD-10-CM | POA: Diagnosis not present

## 2017-09-26 DIAGNOSIS — F39 Unspecified mood [affective] disorder: Secondary | ICD-10-CM | POA: Diagnosis not present

## 2017-09-26 DIAGNOSIS — O209 Hemorrhage in early pregnancy, unspecified: Secondary | ICD-10-CM | POA: Diagnosis present

## 2017-09-26 DIAGNOSIS — F17210 Nicotine dependence, cigarettes, uncomplicated: Secondary | ICD-10-CM | POA: Diagnosis not present

## 2017-09-26 DIAGNOSIS — Z5321 Procedure and treatment not carried out due to patient leaving prior to being seen by health care provider: Secondary | ICD-10-CM | POA: Diagnosis not present

## 2017-09-26 DIAGNOSIS — Z811 Family history of alcohol abuse and dependence: Secondary | ICD-10-CM | POA: Diagnosis not present

## 2017-09-26 DIAGNOSIS — R45851 Suicidal ideations: Secondary | ICD-10-CM | POA: Diagnosis present

## 2017-09-26 DIAGNOSIS — Z560 Unemployment, unspecified: Secondary | ICD-10-CM | POA: Diagnosis not present

## 2017-09-26 MED ORDER — HYDROXYZINE HCL 25 MG PO TABS
25.0000 mg | ORAL_TABLET | Freq: Three times a day (TID) | ORAL | Status: DC | PRN
  Administered 2017-09-26: 25 mg via ORAL
  Filled 2017-09-26 (×2): qty 1

## 2017-09-26 MED ORDER — ALUM & MAG HYDROXIDE-SIMETH 200-200-20 MG/5ML PO SUSP
30.0000 mL | ORAL | Status: DC | PRN
Start: 2017-09-26 — End: 2017-09-30

## 2017-09-26 MED ORDER — ACETAMINOPHEN 325 MG PO TABS
650.0000 mg | ORAL_TABLET | Freq: Four times a day (QID) | ORAL | Status: DC | PRN
  Administered 2017-09-29 (×2): 650 mg via ORAL
  Filled 2017-09-26 (×2): qty 2

## 2017-09-26 MED ORDER — TRAZODONE HCL 50 MG PO TABS
50.0000 mg | ORAL_TABLET | Freq: Every evening | ORAL | Status: DC | PRN
  Administered 2017-09-26: 50 mg via ORAL
  Filled 2017-09-26 (×2): qty 1

## 2017-09-26 MED ORDER — MAGNESIUM HYDROXIDE 400 MG/5ML PO SUSP
30.0000 mL | Freq: Every day | ORAL | Status: DC | PRN
  Administered 2017-09-29: 30 mL via ORAL
  Filled 2017-09-26: qty 30

## 2017-09-26 NOTE — Progress Notes (Signed)
Received report from admitting nurse, Kennis Carinaoris RN. Patient has been observed up in the dayroom with peers. Participating in programming, activities. Affect appropriate. Observed smiling during interactions. No complaints, no distress. Denies pain, physical problems. Level III obs remains in place for safety and patient is safe.

## 2017-09-26 NOTE — H&P (Signed)
Behavioral Health Medical Screening Exam  Nicole [name] is an 27 y.o. female who [facility] is here today with complaints of feeling baby movement in her stomach. She reports this to be an ongoing concern for the past 6 months. She stated that labs has been done and no sign of pregnancy. She reports associated symptom of right rib pain with the movement. She reports suicide ideations without any specific plans, denies any HI/VAH.  Total Time spent with patient: 20 minutes  Psychiatric Specialty Exam: Physical Exam  Nursing note and vitals reviewed. Constitutional: She is oriented to person, place, and time. She appears well-developed and well-nourished.  HENT:  Head: Normocephalic and atraumatic.  Eyes: Pupils are equal, round, and reactive to light.  Neck: Normal range of motion.  Cardiovascular: Normal rate, regular rhythm and normal heart sounds.  Respiratory: Effort normal and breath sounds normal.  GI: Soft. Bowel sounds are normal.  Musculoskeletal: Normal range of motion.  Neurological: She is alert and oriented to person, place, and time.  Skin: Skin is warm and dry.    Review of Systems  Psychiatric/Behavioral: Positive for depression. Negative for hallucinations, memory loss and substance abuse. Suicidal ideas: passive. The patient is nervous/anxious. The patient does not have insomnia.   All other systems reviewed and are negative.   Blood pressure 136/83, pulse 71, temperature (!) 97.5 F (36.4 C), temperature source Oral, resp. rate 20.There is no height or weight on file to calculate BMI.  General Appearance: Casual  Eye Contact:  Good  Speech:  Clear and Coherent and Normal Rate  Volume:  Normal  Mood:  Depressed  Affect:  Appropriate  Thought Process:  Coherent and Goal Directed  Orientation:  Full (Time, Place, and Person)  Thought Content:  WDL and Logical  Suicidal Thoughts:  passive no plans  Homicidal Thoughts:  No  Memory:   Immediate;   Good Recent;   Good Remote;   Fair  Judgement:  Good  Insight:  Good  Psychomotor Activity:  Normal  Concentration: Concentration: Good and Attention Span: Good  Recall:  Good  Fund of Knowledge:Good  Language: Good  Akathisia:  Negative  Handed:  Right  AIMS (if indicated):     Assets:  Communication Skills Desire for Improvement Financial Resources/Insurance Housing Leisure Time Physical Health Resilience Social Support  Sleep:       Musculoskeletal: Strength & Muscle Tone: within normal limits Gait & Station: normal Patient leans: N/A  Blood pressure 136/83, pulse 71, temperature (!) 97.5 F (36.4 C), temperature source Oral, resp. rate 20.  Recommendations:  Based on my evaluation the patient appears to have an emergency condition for which I recommend the patient be admitted inpatient for medication management and stabilization.   Delila PereyraJustina A Okonkwo, NP 09/26/2017, 9:20 AM

## 2017-09-26 NOTE — Progress Notes (Signed)
Patient ID: Lora PaulaKashia Cockrum, female   DOB: 10-23-1990, 27 y.o.   MRN: [id number] Patient is a 27 year old female [facility]  Pt reports that there is a constant movement in her stomach which makes her feel like dying.  Pt also reports that she has been unable to obtain a job for one year now even though she has been to seven job interviews. Pt also reports +AH of voices telling her "mean stuff", , and telling her to kill herself.  Pt reports that the last time that she heard the voices was this morning.  Pt reports +VH of shadows, but adds that she thinks that it is due to her bad vision.  Pt able to verbally contract for safety on the unit.  Pt reports consistently having feelings of anger, anxiety, hopelessness, poor concentration, and crying spells, and self reports a history of anxiety and depression.  Pt reports being on medications for these, but states that she does not remember the names of the medications. Pt reports a history of suicidal attempts by drinking nail polish remover and rubbing alcohol, and states that the most recent suicide attempt was last week by hanging, and that "the rope slipped".  Pt reports that she lives with her mother, her children (three), and her sister.  Pt educated on unit rules and protocols, verbalizes understanding, and oriented to care setting and her room.  V/S taken and WNL, Q15 minute checks initiated for safety, report given to pt's assigned RN.

## 2017-09-26 NOTE — Progress Notes (Signed)
Adult Psychoeducational Group Note  Date:  09/26/2017 Time:  9:48 PM  Group Topic/Focus:  Wrap-Up Group:   The focus of this group is to help patients review their daily goal of treatment and discuss progress on daily workbooks.  Participation Level:  Active  Participation Quality:  Appropriate  Affect:  Appropriate  Cognitive:  Appropriate  Insight: Appropriate  Engagement in Group:  Engaged  Modes of Intervention:  Discussion  Additional Comments:  Patient attended group and said that her day was a 7.  Patient was asked if she was a leader in school and she said yes. In fact, she did ROTC in high school. Phoenyx Melka W Russell Engelstad 09/26/2017, 9:48 PM
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
## 2017-09-26 NOTE — Progress Notes (Signed)
D.  Pt pleasant on approach, no complaints voiced.  Pt was positive for evening wrap up group with appropriate interaction.  Pt observed playing cards with peers in dayroom.  Pt denies SI/HI/AVH at this time.  A.  Support and encouragement offered, medication given as ordered.  R.  Pt remains safe on the unit, will continue to monitor.

## 2017-09-27 DIAGNOSIS — F1010 Alcohol abuse, uncomplicated: Secondary | ICD-10-CM

## 2017-09-27 DIAGNOSIS — Z599 Problem related to housing and economic circumstances, unspecified: Secondary | ICD-10-CM

## 2017-09-27 DIAGNOSIS — Z560 Unemployment, unspecified: Secondary | ICD-10-CM

## 2017-09-27 DIAGNOSIS — F332 Major depressive disorder, recurrent severe without psychotic features: Secondary | ICD-10-CM

## 2017-09-27 DIAGNOSIS — R45851 Suicidal ideations: Secondary | ICD-10-CM

## 2017-09-27 DIAGNOSIS — Z811 Family history of alcohol abuse and dependence: Secondary | ICD-10-CM

## 2017-09-27 MED ORDER — ARIPIPRAZOLE 10 MG PO TABS
10.0000 mg | ORAL_TABLET | Freq: Every day | ORAL | Status: DC
  Administered 2017-09-27 – 2017-09-28 (×2): 10 mg via ORAL
  Filled 2017-09-27 (×5): qty 1

## 2017-09-27 MED ORDER — CITALOPRAM HYDROBROMIDE 20 MG PO TABS
20.0000 mg | ORAL_TABLET | Freq: Every day | ORAL | Status: DC
  Administered 2017-09-27 – 2017-09-30 (×4): 20 mg via ORAL
  Filled 2017-09-27 (×7): qty 1

## 2017-09-27 MED ORDER — GABAPENTIN 100 MG PO CAPS
100.0000 mg | ORAL_CAPSULE | Freq: Three times a day (TID) | ORAL | Status: DC
  Administered 2017-09-27 – 2017-09-30 (×10): 100 mg via ORAL
  Filled 2017-09-27 (×15): qty 1

## 2017-09-27 NOTE — [facility] Suicide Risk Assessment (Signed)
[facility]   Nursing information obtained from:    Demographic factors:    Current Mental Status:    Loss Factors:    Historical Factors:    Risk Reduction Factors:     Total Time spent with patient: 30 minutes Principal Problem: <principal problem not specified> Diagnosis:   Patient Active Problem List   Diagnosis Date Noted  . MDD (major depressive disorder) [F32.9] 09/26/2017  . Alcohol abuse [F10.10] 09/21/2017  . 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 Active labor [IMO0001] 01/04/2014  . NVD (normal vaginal delivery) [O80] 01/04/2014  . Cesarean delivery, without mention of indication, delivered, with or without mention of antepartum condition [O82] 10/18/2011   Subjective Data:  27 y.o AAF, single, lives with her mother and her threechildren, unemployed. Background history of MDD and AUD. Discharged from our unit and represented within twenty four hours. Reported past history of suicidal behavior. Recent discharge from inpatient care. Overwhelmed by her financial situation. No  family history of suicide, no evidence of psychosis. No evidence of mania. No cognitive impairment. No access to weapons. She is cooperative with care. She has agreed to treatment recommendations. She  has agreed to communicate suicidal thoughts of with staff if the thoughts becomes overwhelming.     Continued Clinical Symptoms:  Alcohol Use Disorder Identification Test Final Score (AUDIT): 0 The "Alcohol Use Disorders Identification Test", Guidelines for Use in Primary Care, Second Edition.  World Science writerHealth Organization [facility](WHO). Score between 0-7:  no or low risk or alcohol related problems. Score between 8-15:  moderate risk of alcohol related problems. Score between 16-19:  high risk of alcohol related problems. Score 20 or above:  warrants further diagnostic evaluation for alcohol dependence and treatment.   CLINICAL  FACTORS:   Panic Attacks   Musculoskeletal: Strength & Muscle Tone: within normal limits Gait & Station: normal Patient leans: N/A  Psychiatric Specialty Exam: Physical Exam  ROS  Blood pressure 107/78, pulse 99, temperature 99.1 F (37.3 C), temperature source Oral, resp. rate 18, height 5\' 5"  (1.651 m), weight 71.7 kg (158 lb), SpO2 100 %.Body mass index is 26.29 kg/m.  General Appearance: As in H&P  Eye Contact:    Speech:    Volume:    Mood:    Affect:    Thought Process:    Orientation:    Thought Content:    Suicidal Thoughts:    Homicidal Thoughts:  As in H&P  Memory:    Judgement:    Insight:    Psychomotor Activity:    Concentration:    Recall:    Fund of Knowledge:    Language:    Akathisia:    Handed:  Right  AIMS (if indicated):     Assets:    ADL's:    Cognition:  As in H&P  Sleep:  Number of Hours: 6.25      COGNITIVE FEATURES THAT CONTRIBUTE TO RISK:  None    SUICIDE RISK:   Minimal: No identifiable suicidal ideation.  Patients presenting with no risk factors but with morbid ruminations; may be classified as minimal risk based on the severity of the depressive symptoms  PLAN OF CARE:  As in H&P  I certify that inpatient services furnished can reasonably be expected to improve the patient's condition.   Georgiann CockerVincent A Izediuno, MD 09/27/2017, 3:36 PM

## 2017-09-27 NOTE — Progress Notes (Signed)
D Patient is seen in the dayroom. SHe is quiet. Does not initiate conversation with others but she  Spends most of her time in the dayrooms, either attending her groups and / or spending times with her peers. SHe is much less guarded today. She is more relaxed. She hesitates to make eye contact but admits top this writer " I don't feel as nervous today as I did yesterday". A She completes her daily assessment and on this she wrote  Denied SI today and she rated her depression, hopeelssness and axneity " 4/5/8", respectively. R Safety is in place.

## 2017-09-27 NOTE — H&P (Signed)
Psychiatric Admission Assessment Adult  Patient Identification: Nicole [name] MRN:  [id number] Date of Evaluation:  09/27/2017 Chief Complaint:  Anxiety attack and suicidal thoughts Principal Diagnosis: MDD Diagnosis:   Patient Active Problem List   Diagnosis Date Noted  . MDD (major depressive disorder) [F32.9] 09/26/2017  . Alcohol abuse [F10.10] 09/21/2017  . 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 Active labor [IMO0001] 01/04/2014  . NVD (normal vaginal delivery) [O80] 01/04/2014  . Cesarean delivery, without mention of indication, delivered, with or without mention of antepartum condition [O82] 10/18/2011   History of Present Illness:   27 y.o AAF, single, lives with her mother and her threechildren, unemployed. Background history of MDD and AUD. Discharged from our unit and represented within twenty four hours.   Intake note copied below Patient was brought back into [facility] by a Staff Member after he found her in the parking lot stating thoughts of taking her life.  Patient was reassess for suicidal thoughts by this Counselor;  Patient reports having thoughts about taking her life if the movement in her stomach does not stop.  Patient was asked about a plan and she reports "possibly jumping off of something."  Patient will be admitted to [facility].  Staff reports she has been interacting with peer. She has not been observed to be in pain or discomfort. She has not been observed to be internally stimulated. She has maintained normal biological functions.  Patient was playing a game Theme park manager) with peers. I observed her intensely engaged. She seems to be enjoying the game.  At interview, patient tells me that after she was discharged, she went back home to her mother's place. Says everyone was ready to party at home. She just did not feel like it. Says she had a panic attack and came back to the hospital. Patient states that while she was  here in the hospital, she felt maybe she was better off dead. Says she has not had any suicidal thoughts since she has been admitted. Says she spoke to her grand mother. She wanst to move in with her grand mother when she gets discharged. Says they all live close hence she can still see her children regularly. She has not had any panic attacks since she has been here. Denies any recent use of substances. No associated psychosis. No evidence of mania. No overwhelming anxiety. No evidence of PTSD.  No thoughts of harming others. No thoughts of violence. No access to weapons.   Past Psychiatric History: Recently discharged on Citalopram 20 mg, Abilify 10 mg and Gabapentin 100 mg TID.  As per old notes no prior psychiatric admissions, reports she has overdosed in the past , and states about a month ago she had try to overdose on Nyquil , but at the time did not seek attention. Reports past history of self injurious behaviors such as punching self or walls, and past history of self cutting. Reports history of depression which she describes as intermittent , started several years ago. She also describes hallucinations/psychotic symptoms as above . Denies history of mania or hypomania. Describes occasional panic attacks and endorses agoraphobia.   Is the patient at risk to self? No.  Has the patient been a risk to self in the past 6 months? Yes.    Has the patient been a risk to self within the distant past? Yes.    Is the patient a risk to others? No.  Has the patient been a risk to  others in the past 6 months? No.  Has the patient been a risk to others within the distant past? No.   Prior Inpatient Therapy:   Prior Outpatient Therapy:    Alcohol Screening: Patient refused Alcohol Screening Tool: Yes 1. How often do you have a drink containing alcohol?: Never 2. How many drinks containing alcohol do you have on a typical day when you are drinking?: 1 or 2 3. How often do you have six or more drinks  on one occasion?: Never AUDIT-C Score: 0 4. How often during the last year have you found that you were not able to stop drinking once you had started?: Never 5. How often during the last year have you failed to do what was normally expected from you becasue of drinking?: Never 6. How often during the last year have you needed a first drink in the morning to get yourself going after a heavy drinking session?: Never 7. How often during the last year have you had a feeling of guilt of remorse after drinking?: Never 8. How often during the last year have you been unable to remember what happened the night before because you had been drinking?: Never 9. Have you or someone else been injured as a result of your drinking?: No 10. Has a relative or friend or a doctor or another health worker been concerned about your drinking or suggested you cut down?: No Alcohol Use Disorder Identification Test Final Score (AUDIT): 0 Intervention/Follow-up: Patient Refused Substance Abuse History in the last 12 months:  Yes.   Consequences of Substance Abuse: Family issues Previous Psychotropic Medications: Yes  Psychological Evaluations: Yes  Past Medical History:  Past Medical History:  Diagnosis Date  . Pregnancy induced hypertension 2011    Past Surgical History:  Procedure Laterality Date  . CESAREAN SECTION  10/14/2011   Procedure: CESAREAN SECTION;  Surgeon: Kathreen CosierBernard A Marshall, MD;  Location: [facility];  Service: Gynecology;  Laterality: N/A;  Primary Cesarean Section Delivery Baby Boy @ 0001, Apgars 8/9  . WISDOM TOOTH EXTRACTION  2010   Family History:  Family History  Problem Relation Age of Onset  . Hypertension Mother   . Hypertension Father    Family Psychiatric  History: denies history of mental illness in family,states there is a strong history of alcohol abuse in mother's family, no known suicides in family .   Tobacco Screening:   Social History:  Social History   Substance and Sexual  Activity  Alcohol Use No     Social History   Substance and Sexual Activity  Drug Use No    Additional Social History:          Allergies:  No Known Allergies Lab Results: No results found for this or any previous visit (from the past 48 hour(s)).  Blood Alcohol level:  Lab Results  Component Value Date   ETH <10 09/20/2017   ETH <10 06/17/2017    Metabolic Disorder Labs:  Lab Results  Component Value Date   HGBA1C 5.4 09/21/2017   MPG 108.28 09/21/2017   No results found for: PROLACTIN Lab Results  Component Value Date   CHOL 147 09/21/2017   TRIG 41 09/21/2017   HDL 45 09/21/2017   CHOLHDL 3.3 09/21/2017   VLDL 8 09/21/2017   LDLCALC 94 09/21/2017    Current Medications: Current Facility-Administered Medications  Medication Dose Route Frequency Provider Last Rate Last Dose  . acetaminophen (TYLENOL) tablet 650 mg  650 mg Oral Q6H PRN Beryle Lathekonkwo,  Justina A, NP      . alum & mag hydroxide-simeth (MAALOX/MYLANTA) 200-200-20 MG/5ML suspension 30 mL  30 mL Oral Q4H PRN Okonkwo, Justina A, NP      . ARIPiprazole (ABILIFY) tablet 10 mg  10 mg Oral Daily Izediuno, Vincent A, MD      . citalopram (CELEXA) tablet 20 mg  20 mg Oral Daily Izediuno, Vincent A, MD      . gabapentin (NEURONTIN) capsule 100 mg  100 mg Oral TID Izediuno, Vincent A, MD      . hydrOXYzine (ATARAX/VISTARIL) tablet 25 mg  25 mg Oral TID PRN Beryle Lathe, Justina A, NP   25 mg at 09/26/17 2247  . magnesium hydroxide (MILK OF MAGNESIA) suspension 30 mL  30 mL Oral Daily PRN Okonkwo, Justina A, NP      . traZODone (DESYREL) tablet 50 mg  50 mg Oral QHS PRN Okonkwo, Justina A, NP   50 mg at 09/26/17 2247   PTA Medications: Medications Prior to Admission  Medication Sig Dispense Refill Last Dose  . ARIPiprazole (ABILIFY) 10 MG tablet Take 1 tablet (10 mg total) by mouth daily. For mood control 30 tablet 0   . citalopram (CELEXA) 20 MG tablet Take 1 tablet (20 mg total) by mouth daily. For mood control 30  tablet 0   . gabapentin (NEURONTIN) 100 MG capsule Take 1 capsule (100 mg total) by mouth 3 (three) times daily. 90 capsule 0   . hydrOXYzine (ATARAX/VISTARIL) 25 MG tablet Take 1 tablet (25 mg total) by mouth every 6 (six) hours as needed for anxiety. 30 tablet 0   . traZODone (DESYREL) 50 MG tablet Take 1 tablet (50 mg total) by mouth at bedtime as needed for sleep. 30 tablet 0     Musculoskeletal: Strength & Muscle Tone: within normal limits Gait & Station: normal Patient leans: N/A  Psychiatric Specialty Exam: Physical Exam  Constitutional: She is oriented to person, place, and time. She appears well-developed and well-nourished.  HENT:  Head: Normocephalic and atraumatic.  Respiratory: Effort normal.  Musculoskeletal: Normal range of motion.  Neurological: She is alert and oriented to person, place, and time.  Psychiatric:  As above    ROS  Blood pressure 107/78, pulse 99, temperature 99.1 F (37.3 C), temperature source Oral, resp. rate 18, height 5\' 5"  (1.651 m), weight 71.7 kg (158 lb), SpO2 100 %.Body mass index is 26.29 kg/m.  General Appearance: Not in any distress. Pleasant and engages well. Appropriate behavior.  Eye Contact:  Good  Speech:  Clear and Coherent and Normal Rate  Volume:  Normal  Mood:  Euthymic  Affect:  Appropriate and Full Range  Thought Process:  Linear  Orientation:  Full (Time, Place, and Person)  Thought Content:  No delusional theme. No preoccupation with violent thoughts. No negative ruminations. No obsession.  No hallucination in any modality.   Suicidal Thoughts:  No  Homicidal Thoughts:  No  Memory:  Immediate;   Good Recent;   Good Remote;   Good  Judgement:  Good  Insight:  Good  Psychomotor Activity:  Normal  Concentration:  Concentration: Good and Attention Span: Good  Recall:  Good  Fund of Knowledge:  Good  Language:  Good  Akathisia:  Negative  Handed:    AIMS (if indicated):     Assets:  Communication Skills Desire for  Improvement Physical Health Resilience  ADL's:  Intact  Cognition:  WNL  Sleep:  Number of Hours: 6.25    Treatment  Plan Summary: Patient is not pervasively depressed. She is not psychotic. Patient is not manic. She currently does not have any suicidal thoughts. No evidence of dangerousness. We would gather collateral and evaluate her further.   Psychiatric: MDD  SUD  Medical:  Psychosocial:  Financial constraints  Unemployed  PLAN:  1.  Citalopram to 20 mg daily 2. Abilify 10 mg daily.  3. Gabapentin 100 mg TID 4. Continue to monitor mood, behavior and interaction with peers 5. Continue to encourage unit groups and therapeutic activity 6. SW would gather collateral from her grand mother and cousin Pincus Sanes [phone]  Observation Level/Precautions:  15 minute checks  Laboratory:    Psychotherapy:    Medications:    Consultations:    Discharge Concerns:    Estimated LOS:  Other:     Physician Treatment Plan for Primary Diagnosis: <principal problem not specified> Long Term Goal(s): Improvement in symptoms so as ready for discharge  Short Term Goals: Ability to identify changes in lifestyle to reduce recurrence of condition will improve, Ability to verbalize feelings will improve, Ability to disclose and discuss suicidal ideas, Ability to demonstrate self-control will improve, Ability to identify and develop effective coping behaviors will improve, Ability to maintain clinical measurements within normal limits will improve, Compliance with prescribed medications will improve and Ability to identify triggers associated with substance abuse/mental health issues will improve  Physician Treatment Plan for Secondary Diagnosis: Active Problems:   MDD (major depressive disorder)  Long Term Goal(s): Improvement in symptoms so as ready for discharge  Short Term Goals: Ability to identify changes in lifestyle to reduce recurrence of condition will improve, Ability to  verbalize feelings will improve, Ability to disclose and discuss suicidal ideas, Ability to demonstrate self-control will improve, Ability to identify and develop effective coping behaviors will improve, Ability to maintain clinical measurements within normal limits will improve, Compliance with prescribed medications will improve and Ability to identify triggers associated with substance abuse/mental health issues will improve  I certify that inpatient services furnished can reasonably be expected to improve the patient's condition.    Georgiann Cocker, MD 1/20/20193:13 PM

## 2017-09-27 NOTE — [facility] Group Notes (Signed)
[facility] LCSW Group Therapy Note  Date/Time:  09/27/2017 10:00-11:00AM  Type of Therapy and Topic:  Group Therapy:  Healthy and Unhealthy Supports  Participation Level:  Minimal   Description of Group:  Patients in this group were introduced to the idea of adding a variety of healthy supports to address the various needs in their lives. The picture on the front of Sunday's workbook was used to demonstrate why more supports are needed in every patient's life.  Patients identified and described healthy supports versus unhealthy supports in general, then gave examples of each in their own lives.   They discussed what additional healthy supports could be helpful in their recovery and wellness after discharge in order to prevent future hospitalizations.   An emphasis was placed on using counselor, doctor, therapy groups, 12-step groups, and problem-specific support groups to expand supports.  They also worked as a group on developing a specific plan for several patients to deal with unhealthy supports through boundary-setting, psychoeducation with loved ones, and even termination of relationships.   Therapeutic Goals:   1)  discuss importance of adding supports to stay well once out of the hospital  2)  compare healthy versus unhealthy supports and identify some examples of each  3)  generate ideas and descriptions of healthy supports that can be added  4)  offer mutual support about how to address unhealthy supports  5)  encourage active participation in and adherence to discharge plan    Summary of Patient Progress:  The patient shared that the current healthy supports available in her life are herself when she is positive and focused and her grandma who listens to her, while the current unhealthy supports are a lot of family members who are only "for themselves" and nobody else.  The patient expressed a willingness to add therapy to help in her recovery journey.   Therapeutic Modalities:    Motivational Interviewing Brief Solution-Focused Therapy  Ambrose MantleMareida Grossman-Orr, LCSW

## 2017-09-27 NOTE — Progress Notes (Signed)
D.  Pt pleasant on approach, no complaints voiced.  Pt was positive for evening wrap up group, observed engaged in appropriate interaction with peers on the unit.  Pt denies SI/HI/AVH at this time.  A.  Support and encouragement offered, medication given as ordered  R. the patient remains safe on the unit, will continue to monitor.

## 2017-09-28 DIAGNOSIS — F17210 Nicotine dependence, cigarettes, uncomplicated: Secondary | ICD-10-CM

## 2017-09-28 DIAGNOSIS — F419 Anxiety disorder, unspecified: Secondary | ICD-10-CM

## 2017-09-28 DIAGNOSIS — F39 Unspecified mood [affective] disorder: Secondary | ICD-10-CM

## 2017-09-28 DIAGNOSIS — G4700 Insomnia, unspecified: Secondary | ICD-10-CM

## 2017-09-28 DIAGNOSIS — F333 Major depressive disorder, recurrent, severe with psychotic symptoms: Secondary | ICD-10-CM

## 2017-09-28 MED ORDER — ARIPIPRAZOLE 15 MG PO TABS
15.0000 mg | ORAL_TABLET | Freq: Every day | ORAL | Status: DC
  Administered 2017-09-29 – 2017-09-30 (×2): 15 mg via ORAL
  Filled 2017-09-28 (×4): qty 1

## 2017-09-28 MED ORDER — ARIPIPRAZOLE 5 MG PO TABS
5.0000 mg | ORAL_TABLET | Freq: Once | ORAL | Status: AC
  Administered 2017-09-28: 5 mg via ORAL
  Filled 2017-09-28: qty 1

## 2017-09-28 NOTE — Progress Notes (Signed)
D:  Patient's self inventory sheet, patient sleeps good, no sleep medication given.  Fair appetite, normal energy level, good concentration.  Rated depression 8, hopeless and anxiety #7.  Denied withdrawals.  Sometimes does have SI thoughts.  Denied physical problems.  Checked blurred vision.  Physical pain, headache.  No pain medicine.  Goal is not to give up.  Plans not sure,  "help".   Does have discharge plans. A:  Medications administered per MD orders.  Emotional support and encouragement given patient. R:  Denied SI and HI, contracts for safety.  Stated she does see shadows blurry and hears mumblings, vague.

## 2017-09-28 NOTE — [facility] Suicide Risk Assessment (Addendum)
[facility] INPATIENT:  Family/Significant Other Suicide Prevention Education  Suicide Prevention Education:  Contact Attempts: Annye RuskVirginia Lim, grandmother, [phone], has been identified by the patient as the family member/significant other with whom the patient will be residing, and identified as the person(s) who will aid the patient in the event of a mental health crisis.  With written consent from the patient, two attempts were made to provide suicide prevention education, prior to and/or following the patient's discharge.  We were unsuccessful in providing suicide prevention education.  A suicide education pamphlet was given to the patient to share with family/significant other.  Date and time of first attempt:09/28/17, [id number] Date and time of second attempt: 09/28/17 1133  [name], Mahrukh Seguin Jon, LCSW 09/28/2017, 9:39 AM

## 2017-09-28 NOTE — [facility] Group Notes (Signed)
[facility] LCSW Group Therapy Note  Date/Time: 09/28/17, 1315  Type of Therapy and Topic:  Group Therapy:  Overcoming Obstacles  Participation Level:  moderate  Description of Group:    In this group patients will be encouraged to explore what they see as obstacles to their own wellness and recovery. They will be guided to discuss their thoughts, feelings, and behaviors related to these obstacles. The group will process together ways to cope with barriers, with attention given to specific choices patients can make. Each patient will be challenged to identify changes they are motivated to make in order to overcome their obstacles. This group will be process-oriented, with patients participating in exploration of their own experiences as well as giving and receiving support and challenge from other group members.  Therapeutic Goals: 1. Patient will identify personal and current obstacles as they relate to admission. 2. Patient will identify barriers that currently interfere with their wellness or overcoming obstacles.  3. Patient will identify feelings, thought process and behaviors related to these barriers. 4. Patient will identify two changes they are willing to make to overcome these obstacles:    Summary of Patient Progress: Pt identified the lack of a healthy support system as the biggest obstacle in her life.  Pt did not participate much in group discussion but was attentive throughout.      Therapeutic Modalities:   Cognitive Behavioral Therapy Solution Focused Therapy Motivational Interviewing Relapse Prevention Therapy  Daleen SquibbGreg Makynzi Eastland, LCSW

## 2017-09-28 NOTE — Plan of Care (Signed)
Nurse discussed depression, anxiety, coping skills with patient.  

## 2017-09-28 NOTE — Progress Notes (Signed)
Recreation Therapy Notes  Date: 09/28/17 Time: 0930 Location: 300 Hall Dayroom  Group Topic: Stress Management  Goal Area(s) Addresses:  Patient will verbalize importance of using healthy stress management.  Patient will identify positive emotions associated with healthy stress management.   Intervention: Stress Management  Activity :  Meditation.  LRT introduced the stress management technique of meditation.  LRT played a meditation from the Calm app dealing with forgiveness of self.  Patients were to follow along with the meditation as it played to engage in the activity.  Education:  Stress Management, Discharge Planning.   Education Outcome: Acknowledges edcuation/In group clarification offered/Needs additional education  Clinical Observations/Feedback: Pt did not attend group.     Caroll RancherMarjette Ayub Kirsh, LRT/CTRS         Caroll RancherLindsay, Raylan Troiani A 09/28/2017 12:17 PM

## 2017-09-28 NOTE — Progress Notes (Signed)
Patient verbalized in group that she learned today that her mother is her biggest supporter. She also learned about herself today but did not go into further detail. She was unable to state her wellness strategy.

## 2017-09-28 NOTE — Progress Notes (Signed)
Adult Psychoeducational Group Note  Date:  09/28/2017 Time:  2:18 PM  Group Topic/Focus:  Wellness Toolbox:   The focus of this group is to discuss various aspects of wellness, balancing those aspects and exploring ways to increase the ability to experience wellness.  Patients will create a wellness toolbox for use upon discharge.  Participation Level:  Active  Participation Quality:  Appropriate  Affect:  Appropriate  Cognitive:  Alert  Insight: Good  Engagement in Group:  Engaged  Modes of Intervention:  Discussion  Additional Comments:  Pt did participate in all group activities and discussions today.  Kaycee Mcgaugh R Joelie Schou 09/28/2017, 2:18 PM
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
## 2017-09-28 NOTE — [facility] Suicide Risk Assessment (Signed)
[facility] INPATIENT:  Family/Significant Other Suicide Prevention Education  Suicide Prevention Education:  Education Completed; Nicole [name], grandmother, [phone], has been identified by the patient as the family member/significant other with whom the patient will be residing, and identified as the person(s) who will aid the patient in the event of a mental health crisis (suicidal ideations/suicide attempt).  With written consent from the patient, the family member/significant other has been provided the following suicide prevention education, prior to the and/or following the discharge of the patient.  The suicide prevention education provided includes the following:  Suicide risk factors  Suicide prevention and interventions  National Suicide Hotline telephone number  [facility] assessment telephone number  [facility] Assistance 911  [facility] and/or Residential Mobile Crisis Unit telephone number  Request made of family/significant other to:  Remove weapons (e.g., guns, rifles, knives), all items previously/currently identified as safety concern.    Remove drugs/medications (over-the-counter, prescriptions, illicit drugs), all items previously/currently identified as a safety concern.  The family member/significant other verbalizes understanding of the suicide prevention education information provided.  The family member/significant other agrees to remove the items of safety concern listed above.  [place] said she has not spoken to pt and is would need to talk with some people before she would know if it is an option for pt to stay with her.  She lives in a retirement community and would need approval to have a guest.  She has not talked to Nicole [name] since before Christmas.  She asked CSW to call her back.  Nicole [name], Nicole Chiarelli Jon, LCSW 09/28/2017, 11:47 AM

## 2017-09-28 NOTE — Tx Team (Signed)
Interdisciplinary Treatment and Diagnostic Plan Update  09/28/2017 Time of Session: 0950 Nicole [name] MRN: [id number]  Principal Diagnosis: MDD (major depressive disorder)  Secondary Diagnoses: Principal Problem:   MDD (major depressive disorder)   Current Medications:  Current Facility-Administered Medications  Medication Dose Route Frequency Provider Last Rate Last Dose  . acetaminophen (TYLENOL) tablet 650 mg  650 mg Oral Q6H PRN Okonkwo, Justina A, NP      . alum & mag hydroxide-simeth (MAALOX/MYLANTA) 200-200-20 MG/5ML suspension 30 mL  30 mL Oral Q4H PRN Okonkwo, Justina A, NP      . ARIPiprazole (ABILIFY) tablet 10 mg  10 mg Oral Daily Izediuno, Delight Ovens, MD   10 mg at 09/28/17 0836  . citalopram (CELEXA) tablet 20 mg  20 mg Oral Daily Izediuno, Delight Ovens, MD   20 mg at 09/28/17 0836  . gabapentin (NEURONTIN) capsule 100 mg  100 mg Oral TID Georgiann Cocker, MD   100 mg at 09/28/17 0836  . hydrOXYzine (ATARAX/VISTARIL) tablet 25 mg  25 mg Oral TID PRN Ferne Reus A, NP   25 mg at 09/26/17 2247  . magnesium hydroxide (MILK OF MAGNESIA) suspension 30 mL  30 mL Oral Daily PRN Okonkwo, Justina A, NP      . traZODone (DESYREL) tablet 50 mg  50 mg Oral QHS PRN Okonkwo, Justina A, NP   50 mg at 09/26/17 2247   PTA Medications: Medications Prior to Admission  Medication Sig Dispense Refill Last Dose  . ARIPiprazole (ABILIFY) 10 MG tablet Take 1 tablet (10 mg total) by mouth daily. For mood control 30 tablet 0   . citalopram (CELEXA) 20 MG tablet Take 1 tablet (20 mg total) by mouth daily. For mood control 30 tablet 0   . gabapentin (NEURONTIN) 100 MG capsule Take 1 capsule (100 mg total) by mouth 3 (three) times daily. 90 capsule 0   . hydrOXYzine (ATARAX/VISTARIL) 25 MG tablet Take 1 tablet (25 mg total) by mouth every 6 (six) hours as needed for anxiety. 30 tablet 0   . traZODone (DESYREL) 50 MG tablet Take 1 tablet (50 mg total) by mouth at bedtime as needed for sleep. 30  tablet 0     Patient Stressors:    Patient Strengths:    Treatment Modalities: Medication Management, Group therapy, Case management,  1 to 1 session with clinician, Psychoeducation, Recreational therapy.   Physician Treatment Plan for Primary Diagnosis: MDD (major depressive disorder) Long Term Goal(s): Improvement in symptoms so as ready for discharge Improvement in symptoms so as ready for discharge   Short Term Goals: Ability to identify changes in lifestyle to reduce recurrence of condition will improve Ability to verbalize feelings will improve Ability to disclose and discuss suicidal ideas Ability to demonstrate self-control will improve Ability to identify and develop effective coping behaviors will improve Ability to maintain clinical measurements within normal limits will improve Compliance with prescribed medications will improve Ability to identify triggers associated with substance abuse/mental health issues will improve Ability to identify changes in lifestyle to reduce recurrence of condition will improve Ability to verbalize feelings will improve Ability to disclose and discuss suicidal ideas Ability to demonstrate self-control will improve Ability to identify and develop effective coping behaviors will improve Ability to maintain clinical measurements within normal limits will improve Compliance with prescribed medications will improve Ability to identify triggers associated with substance abuse/mental health issues will improve  Medication Management: Evaluate patient's response, side effects, and tolerance of medication regimen.  Therapeutic Interventions:  1 to 1 sessions, Unit Group sessions and Medication administration.  Evaluation of Outcomes: Progressing  Physician Treatment Plan for Secondary Diagnosis: Principal Problem:   MDD (major depressive disorder)  Long Term Goal(s): Improvement in symptoms so as ready for discharge Improvement in symptoms so  as ready for discharge   Short Term Goals: Ability to identify changes in lifestyle to reduce recurrence of condition will improve Ability to verbalize feelings will improve Ability to disclose and discuss suicidal ideas Ability to demonstrate self-control will improve Ability to identify and develop effective coping behaviors will improve Ability to maintain clinical measurements within normal limits will improve Compliance with prescribed medications will improve Ability to identify triggers associated with substance abuse/mental health issues will improve Ability to identify changes in lifestyle to reduce recurrence of condition will improve Ability to verbalize feelings will improve Ability to disclose and discuss suicidal ideas Ability to demonstrate self-control will improve Ability to identify and develop effective coping behaviors will improve Ability to maintain clinical measurements within normal limits will improve Compliance with prescribed medications will improve Ability to identify triggers associated with substance abuse/mental health issues will improve     Medication Management: Evaluate patient's response, side effects, and tolerance of medication regimen.  Therapeutic Interventions: 1 to 1 sessions, Unit Group sessions and Medication administration.  Evaluation of Outcomes: Progressing   RN Treatment Plan for Primary Diagnosis: MDD (major depressive disorder) Long Term Goal(s): Knowledge of disease and therapeutic regimen to maintain health will improve  Short Term Goals: Ability to identify and develop effective coping behaviors will improve and Compliance with prescribed medications will improve  Medication Management: RN will administer medications as ordered by provider, will assess and evaluate patient's response and provide education to patient for prescribed medication. RN will report any adverse and/or side effects to prescribing provider.  Therapeutic  Interventions: 1 on 1 counseling sessions, Psychoeducation, Medication administration, Evaluate responses to treatment, Monitor vital signs and CBGs as ordered, Perform/monitor CIWA, COWS, AIMS and Fall Risk screenings as ordered, Perform wound care treatments as ordered.  Evaluation of Outcomes: Progressing   LCSW Treatment Plan for Primary Diagnosis: MDD (major depressive disorder) Long Term Goal(s): Safe transition to appropriate next level of care at discharge, Engage patient in therapeutic group addressing interpersonal concerns.  Short Term Goals: Engage patient in aftercare planning with referrals and resources, Increase social support and Increase skills for wellness and recovery  Therapeutic Interventions: Assess for all discharge needs, 1 to 1 time with Social worker, Explore available resources and support systems, Assess for adequacy in community support network, Educate family and significant other(s) on suicide prevention, Complete Psychosocial Assessment, Interpersonal group therapy.  Evaluation of Outcomes: Progressing   Progress in Treatment: Attending groups: Yes. Participating in groups: Yes. Taking medication as prescribed: Yes. Toleration medication: Yes. Family/Significant other contact made: Yes, individual(s) contacted:  grandmother Patient understands diagnosis: Yes. Discussing patient identified problems/goals with staff: Yes. Medical problems stabilized or resolved: Yes. Denies suicidal/homicidal ideation: Yes. Issues/concerns per patient self-inventory: No. Other: None  New problem(s) identified: Yes, Describe:  problems with living situation.  New Short Term/Long Term Goal(s): Pt goal: to be more stable.  Discharge Plan or Barriers: [facility], 10/01/17.  Reason for Continuation of Hospitalization: Suicidal ideation  Estimated Length of Stay: 1 day.  Attendees: Patient:Nicole [name] 09/28/2017   Physician: Dr Jackquline Berlin, MD 09/28/2017   Nursing: Quintella Reichert, RN 09/28/2017   RN Care Manager: 09/28/2017   Social Worker: Daleen Squibb, LCSW 09/28/2017   Recreational Therapist:  09/28/2017   Other:  09/28/2017   Other:  09/28/2017   Other: 09/28/2017     Scribe for Treatment Team: [name], Jehu Mccauslin Jon, LCSW 09/28/2017 11:42 AM

## 2017-09-28 NOTE — Progress Notes (Signed)
D: Pt was in the dayroom upon initial approach.  Pt presents with depressed affect and mood.  She reports her day has been "okay, getting better."  Her goal is to "stay focused" and when asked if she has met goal, she states "a little, yes."  Pt denies SI/HI, denies hallucinations, denies pain.  Pt has been visible in milieu interacting with peers and staff cautiously.  Pt attended evening group.    A: Introduced self to pt.  Actively listened to pt and offered support and encouragement. Q15 minute safety checks maintained.  R: Pt is safe on the unit.  Pt verbally contracts for safety and reports she will inform staff of needs and concerns.  Will continue to monitor and assess.

## 2017-09-28 NOTE — Progress Notes (Deleted)
Adult Psychoeducational Group Note  Date:  09/28/2017 Time:  2:15 PM  Group Topic/Focus:  Goals Group:   The focus of this group is to help patients establish daily goals to achieve during treatment and discuss how the patient can incorporate goal setting into their daily lives to aide in recovery. Wellness Toolbox:   The focus of this group is to discuss various aspects of wellness, balancing those aspects and exploring ways to increase the ability to experience wellness.  Patients will create a wellness toolbox for use upon discharge.  Participation Level:  Active  Participation Quality:  Appropriate  Affect:  Appropriate  Cognitive:  Alert  Insight: Appropriate  Engagement in Group:  Engaged  Modes of Intervention:  Discussion  Additional Comments:  Pt did participate in all group activities and discussions today.  Olumide Dolinger R Ruberta Holck 09/28/2017, 2:15 PM

## 2017-09-28 NOTE — Progress Notes (Signed)
[facility] MD Progress Note  09/28/2017 3:42 PM Nicole [name]  MRN:  [id number]   Subjective:  Patient reports that she is feeling abetter today. She states that she got overwhelmed when she got home and she was having AH and the voices got really loud and were telling her to hurt herself. She denies any current SI/HI/VH and reports her last AH this morning, but ut was not understandable. She reports that stressful environments make the voices worse. She also reports that she feels that her stomach is moving, but no one can see it. "I was told not to mess with it because it's probably in my head."   Objective: Patient's chart and findings reviewed and discussed with treatment team. Patient presents pleasant and cooperative. She has seen in the day room, but not interacting. She has attended groups. She agrees to have the Abilify increased to 15 mg Daily.   Principal Problem: MDD (major depressive disorder), single episode, severe with psychotic features (HCC) Diagnosis:   Patient Active Problem List   Diagnosis Date Noted  . MDD (major depressive disorder), single episode, severe with psychotic features (HCC) [F32.3] 09/26/2017  . Alcohol abuse [F10.10] 09/21/2017  . 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 Active labor [IMO0001] 01/04/2014  . NVD (normal vaginal delivery) [O80] 01/04/2014  . Cesarean delivery, without mention of indication, delivered, with or without mention of antepartum condition [O82] 10/18/2011   Total Time spent with patient: 25 minutes  Past Psychiatric History: See H&P  Past Medical History:  Past Medical History:  Diagnosis Date  . Pregnancy induced hypertension 2011    Past Surgical History:  Procedure Laterality Date  . CESAREAN SECTION  10/14/2011   Procedure: CESAREAN SECTION;  Surgeon: Kathreen Cosier, MD;  Location: [facility];  Service: Gynecology;  Laterality: N/A;  Primary Cesarean Section Delivery Baby  Boy @ 0001, Apgars 8/9  . WISDOM TOOTH EXTRACTION  2010   Family History:  Family History  Problem Relation Age of Onset  . Hypertension Mother   . Hypertension Father    Family Psychiatric  History: See H&P Social History:  Social History   Substance and Sexual Activity  Alcohol Use No     Social History   Substance and Sexual Activity  Drug Use No    Social History   Socioeconomic History  . Marital status: Single    Spouse name: None  . Number of children: None  . Years of education: None  . Highest education level: None  Social Needs  . Financial resource strain: None  . Food insecurity - worry: None  . Food insecurity - inability: None  . Transportation needs - medical: None  . Transportation needs - non-medical: None  Occupational History  . None  Tobacco Use  . Smoking status: Current Some Day Smoker    Types: Cigars  . Smokeless tobacco: Never Used  Substance and Sexual Activity  . Alcohol use: No  . Drug use: No  . Sexual activity: Not Currently    Birth control/protection: None  Other Topics Concern  . None  Social History Narrative  . None   Additional Social History:                         Sleep: Good  Appetite:  Good  Current Medications: Current Facility-Administered Medications  Medication Dose Route Frequency Provider Last Rate Last Dose  . acetaminophen (TYLENOL) tablet 650 mg  650 mg Oral Q6H PRN Okonkwo, Justina A, NP      . alum & mag hydroxide-simeth (MAALOX/MYLANTA) 200-200-20 MG/5ML suspension 30 mL  30 mL Oral Q4H PRN Okonkwo, Justina A, NP      . [START ON 09/29/2017] ARIPiprazole (ABILIFY) tablet 15 mg  15 mg Oral Daily Klover Priestly, Gerlene Burdock, FNP      . ARIPiprazole (ABILIFY) tablet 5 mg  5 mg Oral Once Mayeli Bornhorst, Gerlene Burdock, FNP      . citalopram (CELEXA) tablet 20 mg  20 mg Oral Daily Izediuno, Delight Ovens, MD   20 mg at 09/28/17 0836  . gabapentin (NEURONTIN) capsule 100 mg  100 mg Oral TID Georgiann Cocker, MD   100 mg at  09/28/17 1154  . hydrOXYzine (ATARAX/VISTARIL) tablet 25 mg  25 mg Oral TID PRN Ferne Reus A, NP   25 mg at 09/26/17 2247  . magnesium hydroxide (MILK OF MAGNESIA) suspension 30 mL  30 mL Oral Daily PRN Okonkwo, Justina A, NP      . traZODone (DESYREL) tablet 50 mg  50 mg Oral QHS PRN Okonkwo, Justina A, NP   50 mg at 09/26/17 2247    Lab Results: No results found for this or any previous visit (from the past 48 hour(s)).  Blood Alcohol level:  Lab Results  Component Value Date   ETH <10 09/20/2017   ETH <10 06/17/2017    Metabolic Disorder Labs: Lab Results  Component Value Date   HGBA1C 5.4 09/21/2017   MPG 108.28 09/21/2017   No results found for: PROLACTIN Lab Results  Component Value Date   CHOL 147 09/21/2017   TRIG 41 09/21/2017   HDL 45 09/21/2017   CHOLHDL 3.3 09/21/2017   VLDL 8 09/21/2017   LDLCALC 94 09/21/2017    Physical Findings: AIMS: Facial and Oral Movements Muscles of Facial Expression: None, normal Lips and Perioral Area: None, normal Jaw: None, normal Tongue: None, normal,Extremity Movements Upper (arms, wrists, hands, fingers): None, normal Lower (legs, knees, ankles, toes): None, normal, Trunk Movements Neck, shoulders, hips: None, normal, Overall Severity Severity of abnormal movements (highest score from questions above): None, normal Incapacitation due to abnormal movements: None, normal Patient's awareness of abnormal movements (rate only patient's report): No Awareness, Dental Status Current problems with teeth and/or dentures?: No Does patient usually wear dentures?: No  CIWA:  CIWA-Ar Total: 1 COWS:  COWS Total Score: 2  Musculoskeletal: Strength & Muscle Tone: within normal limits Gait & Station: normal Patient leans: N/A  Psychiatric Specialty Exam: Physical Exam  Nursing note and vitals reviewed. Constitutional: She is oriented to person, place, and time. She appears well-developed and well-nourished.  Cardiovascular:  Normal rate.  Musculoskeletal: Normal range of motion.  Neurological: She is alert and oriented to person, place, and time.  Skin: Skin is warm.    Review of Systems  Constitutional: Negative.   HENT: Negative.   Eyes: Negative.   Respiratory: Negative.   Cardiovascular: Negative.   Gastrointestinal: Negative.   Genitourinary: Negative.   Musculoskeletal: Negative.   Skin: Negative.   Neurological: Negative.   Endo/Heme/Allergies: Negative.   Psychiatric/Behavioral: Positive for depression and hallucinations (AH). Negative for suicidal ideas.    Blood pressure 127/75, pulse 91, temperature 98.5 F (36.9 C), temperature source Oral, resp. rate 20, height 5\' 5"  (1.651 m), weight 71.7 kg (158 lb), SpO2 100 %.Body mass index is 26.29 kg/m.  General Appearance: Casual  Eye Contact:  Good  Speech:  Clear and Coherent and Normal  Rate  Volume:  Normal  Mood:  Depressed  Affect:  Flat  Thought Process:  Goal Directed and Descriptions of Associations: Intact  Orientation:  Full (Time, Place, and Person)  Thought Content:  Hallucinations: Auditory  Suicidal Thoughts:  No  Homicidal Thoughts:  No  Memory:  Immediate;   Good Recent;   Good Remote;   Good  Judgement:  Good  Insight:  Good  Psychomotor Activity:  Normal  Concentration:  Concentration: Good and Attention Span: Good  Recall:  Good  Fund of Knowledge:  Good  Language:  Good  Akathisia:  No  Handed:  Right  AIMS (if indicated):     Assets:  Communication Skills Desire for Improvement Financial Resources/Insurance Housing Physical Health Social Support Transportation  ADL's:  Intact  Cognition:  WNL  Sleep:  Number of Hours: 6   Problems Addressed: MDD severe with psychotic features  Treatment Plan Summary: Daily contact with patient to assess and evaluate symptoms and progress in treatment, Medication management and Plan is to:  -Increase Abilify 15 mg PO Daily for mood stability -Continue Gabapentin 100  mg PO TID for agitatio/anxiety -Continue Celexa 20 mg PO Daily for mood stability -Continue Vistaril 25 mg PO TID PRN for anxiety -Continue Trazodone 50 mg PO QHS PRN for insomnia -Encourage group therapy participation  Maryfrances Bunnellravis B Shylah Dossantos, FNP 09/28/2017, 3:42 PM

## 2017-09-29 NOTE — Progress Notes (Signed)
[facility] MD Progress Note  09/29/2017 10:58 AM Nicole [name]  MRN:  [id number]   Subjective:  Patient states that she feels better today and that she has no AH today. She denies any SI/HI/AVH and contracts for safety. She states that she slept good and has been eating good. She reports being ready to go to [name] to live with her grandmother. She denies any medication side effects.    Objective: Patient's chart and findings reviewed and discussed with treatment team. Patient presents in her room with a flat affect, but is pleasant and cooperative. There has been a plan established through CSW and the plan is for the patient to be discharging tomorrow, possibly, to go live with her grandmother in [place], [place].   Principal Problem: MDD (major depressive disorder), single episode, severe with psychotic features (HCC) Diagnosis:   Patient Active Problem List   Diagnosis Date Noted  . MDD (major depressive disorder), single episode, severe with psychotic features (HCC) [F32.3] 09/26/2017  . Alcohol abuse [F10.10] 09/21/2017  . 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 Active labor [IMO0001] 01/04/2014  . NVD (normal vaginal delivery) [O80] 01/04/2014  . Cesarean delivery, without mention of indication, delivered, with or without mention of antepartum condition [O82] 10/18/2011   Total Time spent with patient: 15 minutes  Past Psychiatric History: See H&P  Past Medical History:  Past Medical History:  Diagnosis Date  . Pregnancy induced hypertension 2011    Past Surgical History:  Procedure Laterality Date  . CESAREAN SECTION  10/14/2011   Procedure: CESAREAN SECTION;  Surgeon: Kathreen Cosier, MD;  Location: [facility];  Service: Gynecology;  Laterality: N/A;  Primary Cesarean Section Delivery Baby Boy @ 0001, Apgars 8/9  . WISDOM TOOTH EXTRACTION  2010   Family History:  Family History  Problem Relation Age of Onset  . Hypertension  Mother   . Hypertension Father    Family Psychiatric  History: See H&P Social History:  Social History   Substance and Sexual Activity  Alcohol Use No     Social History   Substance and Sexual Activity  Drug Use No    Social History   Socioeconomic History  . Marital status: Single    Spouse name: None  . Number of children: None  . Years of education: None  . Highest education level: None  Social Needs  . Financial resource strain: None  . Food insecurity - worry: None  . Food insecurity - inability: None  . Transportation needs - medical: None  . Transportation needs - non-medical: None  Occupational History  . None  Tobacco Use  . Smoking status: Current Some Day Smoker    Types: Cigars  . Smokeless tobacco: Never Used  Substance and Sexual Activity  . Alcohol use: No  . Drug use: No  . Sexual activity: Not Currently    Birth control/protection: None  Other Topics Concern  . None  Social History Narrative  . None   Additional Social History:                         Sleep: Good  Appetite:  Good  Current Medications: Current Facility-Administered Medications  Medication Dose Route Frequency Provider Last Rate Last Dose  . acetaminophen (TYLENOL) tablet 650 mg  650 mg Oral Q6H PRN Okonkwo, Justina A, NP   650 mg at 09/29/17 0849  . alum & mag hydroxide-simeth (MAALOX/MYLANTA) 200-200-20 MG/5ML suspension 30  mL  30 mL Oral Q4H PRN Okonkwo, Justina A, NP      . ARIPiprazole (ABILIFY) tablet 15 mg  15 mg Oral Daily Money, Gerlene Burdockravis B, FNP   15 mg at 09/29/17 0845  . citalopram (CELEXA) tablet 20 mg  20 mg Oral Daily Izediuno, Delight OvensVincent A, MD   20 mg at 09/29/17 0846  . gabapentin (NEURONTIN) capsule 100 mg  100 mg Oral TID Georgiann CockerIzediuno, Vincent A, MD   100 mg at 09/29/17 0846  . hydrOXYzine (ATARAX/VISTARIL) tablet 25 mg  25 mg Oral TID PRN Ferne Reuskonkwo, Justina A, NP   25 mg at 09/26/17 2247  . magnesium hydroxide (MILK OF MAGNESIA) suspension 30 mL  30 mL Oral  Daily PRN Okonkwo, Justina A, NP      . traZODone (DESYREL) tablet 50 mg  50 mg Oral QHS PRN Okonkwo, Justina A, NP   50 mg at 09/26/17 2247    Lab Results: No results found for this or any previous visit (from the past 48 hour(s)).  Blood Alcohol level:  Lab Results  Component Value Date   ETH <10 09/20/2017   ETH <10 06/17/2017    Metabolic Disorder Labs: Lab Results  Component Value Date   HGBA1C 5.4 09/21/2017   MPG 108.28 09/21/2017   No results found for: PROLACTIN Lab Results  Component Value Date   CHOL 147 09/21/2017   TRIG 41 09/21/2017   HDL 45 09/21/2017   CHOLHDL 3.3 09/21/2017   VLDL 8 09/21/2017   LDLCALC 94 09/21/2017    Physical Findings: AIMS: Facial and Oral Movements Muscles of Facial Expression: None, normal Lips and Perioral Area: None, normal Jaw: None, normal Tongue: None, normal,Extremity Movements Upper (arms, wrists, hands, fingers): None, normal Lower (legs, knees, ankles, toes): None, normal, Trunk Movements Neck, shoulders, hips: None, normal, Overall Severity Severity of abnormal movements (highest score from questions above): None, normal Incapacitation due to abnormal movements: None, normal Patient's awareness of abnormal movements (rate only patient's report): No Awareness, Dental Status Current problems with teeth and/or dentures?: No Does patient usually wear dentures?: No  CIWA:  CIWA-Ar Total: 1 COWS:  COWS Total Score: 2  Musculoskeletal: Strength & Muscle Tone: within normal limits Gait & Station: normal Patient leans: N/A  Psychiatric Specialty Exam: Physical Exam  Nursing note and vitals reviewed. Constitutional: She is oriented to person, place, and time. She appears well-developed and well-nourished.  Cardiovascular: Normal rate.  Respiratory: Effort normal.  Musculoskeletal: Normal range of motion.  Neurological: She is alert and oriented to person, place, and time.  Skin: Skin is warm.    Review of Systems   Constitutional: Negative.   HENT: Negative.   Eyes: Negative.   Respiratory: Negative.   Cardiovascular: Negative.   Gastrointestinal: Negative.   Genitourinary: Negative.   Musculoskeletal: Negative.   Skin: Negative.   Neurological: Negative.   Endo/Heme/Allergies: Negative.   Psychiatric/Behavioral: Positive for depression. Negative for hallucinations and suicidal ideas.    Blood pressure 107/72, pulse 86, temperature 98.5 F (36.9 C), temperature source Oral, resp. rate 18, height 5\' 5"  (1.651 m), weight 71.7 kg (158 lb), SpO2 100 %.Body mass index is 26.29 kg/m.  General Appearance: Casual  Eye Contact:  Good  Speech:  Clear and Coherent and Normal Rate  Volume:  Normal  Mood:  Depressed  Affect:  Flat  Thought Process:  Goal Directed and Descriptions of Associations: Intact  Orientation:  Full (Time, Place, and Person)  Thought Content:  WDL  Suicidal Thoughts:  No  Homicidal Thoughts:  No  Memory:  Immediate;   Good Recent;   Good Remote;   Good  Judgement:  Good  Insight:  Good  Psychomotor Activity:  Normal  Concentration:  Concentration: Good and Attention Span: Good  Recall:  Good  Fund of Knowledge:  Good  Language:  Good  Akathisia:  No  Handed:  Right  AIMS (if indicated):     Assets:  Communication Skills Desire for Improvement Financial Resources/Insurance Housing Physical Health Social Support Transportation  ADL's:  Intact  Cognition:  WNL  Sleep:  Number of Hours: 6.75   Problems Addressed: MDD severe with psychotic features  Treatment Plan Summary: Daily contact with patient to assess and evaluate symptoms and progress in treatment, Medication management and Plan is to:  -Continue Abilify 15 mg PO Daily for mood stability -Continue Gabapentin 100 mg PO TID for agitatio/anxiety -Continue Celexa 20 mg PO Daily for mood stability -Continue Vistaril 25 mg PO TID PRN for anxiety -Continue Trazodone 50 mg PO QHS PRN for insomnia -Encourage  group therapy participation  Maryfrances Bunnell, FNP 09/29/2017, 10:58 AM   Agree with NP Progress Note

## 2017-09-29 NOTE — [facility] Group Notes (Signed)
LCSW Group Therapy Note 09/29/2017 4:31 PM  Type of Therapy/Topic: Group Therapy: Feelings about Diagnosis  Participation Level: Active   Description of Group:  This group will allow patients to explore their thoughts and feelings about diagnoses they have received. Patients will be guided to explore their level of understanding and acceptance of these diagnoses. Facilitator will encourage patients to process their thoughts and feelings about the reactions of others to their diagnosis and will guide patients in identifying ways to discuss their diagnosis with significant others in their lives. This group will be process-oriented, with patients participating in exploration of their own experiences, giving and receiving support, and processing challenge from other group members.  Therapeutic Goals: 1. Patient will demonstrate understanding of diagnosis as evidenced by identifying two or more symptoms of the disorder 2. Patient will be able to express two feelings regarding the diagnosis 3. Patient will demonstrate their ability to communicate their needs through discussion and/or role play  Summary of Patient Progress:  Nicole [name] was engaged during the entire group. She was engaged and contributed to the groups discussion.     Therapeutic Modalities:  Cognitive Behavioral Therapy Brief Therapy Feelings Identification    Christianjames Soule Catalina AntiguaWilliams LCSWA Clinical Social Worker

## 2017-09-29 NOTE — [facility] Suicide Risk Assessment (Signed)
[facility] INPATIENT:  Family/Significant Other Suicide Prevention Education  Suicide Prevention Education:  Education Completed; Donella StadeVeleka McLean, mother, [phone], has been identified by the patient as the family member/significant other with whom the patient will be residing, and identified as the person(s) who will aid the patient in the event of a mental health crisis (suicidal ideations/suicide attempt).  With written consent from the patient, the family member/significant other has been provided the following suicide prevention education, prior to the and/or following the discharge of the patient.  The suicide prevention education provided includes the following:  Suicide risk factors  Suicide prevention and interventions  National Suicide Hotline telephone number  [facility] assessment telephone number  [facility] Emergency Assistance 911  [facility] and/or Residential Mobile Crisis Unit telephone number  Request made of family/significant other to:  Remove weapons (e.g., guns, rifles, knives), all items previously/currently identified as safety concern.  No guns, per [name]  Remove drugs/medications (over-the-counter, prescriptions, illicit drugs), all items previously/currently identified as a safety concern.  The family member/significant other verbalizes understanding of the suicide prevention education information provided.  The family member/significant other agrees to remove the items of safety concern listed above.  See family meeting note.  [name], Chela Sutphen Jon, LCSW 09/29/2017, 8:13 AM

## 2017-09-29 NOTE — Progress Notes (Signed)
Recreation Therapy Notes  Animal-Assisted Activity (AAA) Program Checklist/Progress Notes Patient Eligibility Criteria Checklist & Daily Group note for Rec TxIntervention  Date: 01.22.2019 Time: 2:45pm Location: [address] Dayroom   AAA/T Program Assumption of Risk Form signed by Patient/ or Parent Legal Guardian Yes  Patient is free of allergies or sever asthma Yes  Patient reports no fear of animals Yes  Patient reports no history of cruelty to animals Yes  Patient understands his/her participation is voluntary Yes  Patient washes hands before animal contact Yes  Patient washes hands after animal contact Yes  Behavioral Response: Engaged, Attentive   Education:Hand Washing, Appropriate Animal Interaction   Education Outcome: Acknowledges education.   Clinical Observations/Feedback: Patient attended session and interacted appropriately with therapy dog and peers.   Miklo Aken L Natalyn Szymanowski, LRT/CTRS        Analuisa Tudor L 09/29/2017 4:05 PM 

## 2017-09-29 NOTE — [facility] Group Notes (Signed)
Adult Psychoeducational Group Note  Date:  09/29/2017 Time:  10:47 PM  Group Topic/Focus:  Wrap-Up Group:   The focus of this group is to help patients review their daily goal of treatment and discuss progress on daily workbooks.  Participation Level:  Active  Participation Quality:  Appropriate and Attentive  Affect:  Appropriate  Cognitive:  Alert and Appropriate  Insight: Appropriate and Good  Engagement in Group:  Engaged  Modes of Intervention:  Discussion and Education  Additional Comments:  Pt attended and participated in wrap up group this evening. Pt told writer that they had a okay day even though they could not stay focused. Pt noted that coloring helps them to stay focused. Staying focused was the pt goal in which they did not do well with completeing. A positive noted by the pt was that they did move around more by going to the gym and stretching.   Nicole NettersOctavia A Kafi [name] 09/29/2017, 10:47 PM

## 2017-09-29 NOTE — Progress Notes (Signed)
D: Pt was in the dayroom upon initial approach.  Pt presents with depressed affect and mood.  Reports her day was "decent" and her goal was "just to try to focus.  My attention has been kind of all over the place today."  She reports she slept well last night.  Pt denies SI/HI, denies hallucinations, denies pain.  Pt has been visible in milieu interacting with peers and staff appropriately.  Pt attended evening group.    A: Introduced self to pt.  Actively listened to pt and offered support and encouragement. Q15 minute safety checks maintained.  R: Pt is safe on the unit.  Pt verbally contracts for safety.  Will continue to monitor and assess.

## 2017-09-29 NOTE — [facility] Group Notes (Signed)
Pt attended group activity on mindfulness. Pt was attentive and engaged in group exercise

## 2017-09-30 ENCOUNTER — Emergency Department (HOSPITAL_COMMUNITY)
Admission: EM | Admit: 2017-09-30 | Discharge: 2017-10-01 | Disposition: A | Discharge status: Home / Self Care | Payer: Medicaid Other | Attending: Emergency Medicine | Admitting: Emergency Medicine

## 2017-09-30 ENCOUNTER — Encounter (HOSPITAL_COMMUNITY): Payer: Self-pay | Admitting: Family Medicine

## 2017-09-30 DIAGNOSIS — O209 Hemorrhage in early pregnancy, unspecified: Secondary | ICD-10-CM | POA: Diagnosis not present

## 2017-09-30 DIAGNOSIS — Z915 Personal history of self-harm: Secondary | ICD-10-CM

## 2017-09-30 DIAGNOSIS — Z5321 Procedure and treatment not carried out due to patient leaving prior to being seen by health care provider: Secondary | ICD-10-CM | POA: Insufficient documentation

## 2017-09-30 DIAGNOSIS — F323 Major depressive disorder, single episode, severe with psychotic features: Principal | ICD-10-CM

## 2017-09-30 HISTORY — DX: Major depressive disorder, single episode, unspecified: F32.9

## 2017-09-30 MED ORDER — TRAZODONE HCL 50 MG PO TABS: 50.0000 mg | ORAL_TABLET | Freq: Every evening | ORAL | 0 refills | Status: DC | PRN

## 2017-09-30 MED ORDER — GABAPENTIN 100 MG PO CAPS: 100.0000 mg | ORAL_CAPSULE | Freq: Three times a day (TID) | ORAL | 0 refills | Status: DC

## 2017-09-30 MED ORDER — CITALOPRAM HYDROBROMIDE 20 MG PO TABS: 20.0000 mg | ORAL_TABLET | Freq: Every day | ORAL | 0 refills | Status: DC

## 2017-09-30 MED ORDER — HYDROXYZINE HCL 25 MG PO TABS: 25.0000 mg | ORAL_TABLET | Freq: Three times a day (TID) | ORAL | 0 refills | Status: DC | PRN

## 2017-09-30 MED ORDER — ARIPIPRAZOLE 15 MG PO TABS: 15.0000 mg | ORAL_TABLET | Freq: Every day | ORAL | 0 refills | Status: DC

## 2017-09-30 NOTE — Progress Notes (Signed)
Pt d/c from the hospital. All items returned. D/C instructions given and prescriptions given. Pt denies si and hi. 

## 2017-09-30 NOTE — [facility] Group Notes (Signed)
[facility] [employer] Group Therapy 09/30/2017 1:15pm  Type of Therapy: [employer] Presentation  Participation Level: Active  Participation Quality: Attentive  Affect: Appropriate  Cognitive: Oriented  Insight: Developing/Improving  Engagement in Therapy: Engaged  Modes of Intervention: Discussion, Education and Socialization  Summary of Progress/Problems: [employer] (M[employer]) Speaker came to talk about his personal journey with mental health. The pt processed ways by which to relate to the speaker. MHA speaker provided handouts and educational information pertaining to groups and services offered by the MHA. Pt was engaged in speaker's presentation and was receptive to resources provided.    Nicole Stormes Jon, LCSW 09/30/2017 3:40 PM 

## 2017-09-30 NOTE — Discharge Summary (Signed)
Physician Discharge Summary Note  Patient:  Nicole [name] is an 27 y.o., female MRN:  [id number] DOB:  [birth year] Patient phone:  [phone] (home)  Patient address:   [address] [place] [address],  Total Time spent with patient: 20 minutes  Date of Admission:  09/26/2017 Date of Discharge: 09/30/17   Reason for Admission:  Worsening delusions and SI  Principal Problem: MDD (major depressive disorder), single episode, severe with psychotic features [facility](HCC) Discharge Diagnoses: Patient Active Problem List   Diagnosis Date Noted  . MDD (major depressive disorder), single episode, severe with psychotic features (HCC) [F32.3] 09/26/2017  . Alcohol abuse [F10.10] 09/21/2017  . 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 Active labor [IMO0001] 01/04/2014  . NVD (normal vaginal delivery) [O80] 01/04/2014  . Cesarean delivery, without mention of indication, delivered, with or without mention of antepartum condition [O82] 10/18/2011    Past Psychiatric History: Recently discharged on Citalopram 20 mg, Abilify 10 mg and Gabapentin 100 mg TID.  As per old notes no prior psychiatric admissions, reports she has overdosed in the past , and states about a month ago she had try to overdose on Nyquil , but at the time did not seek attention. Reports past history of self injurious behaviors such as punching self or walls, and past history of self cutting. Reports history of depression which she describes as intermittent , started several years ago. She also describes hallucinations/psychotic symptoms as above . Denies history of mania or hypomania. Describes occasional panic attacks and endorses agoraphobia    Past Medical History:  Past Medical History:  Diagnosis Date  . Pregnancy induced hypertension 2011    Past Surgical History:  Procedure Laterality Date  . CESAREAN SECTION  10/14/2011   Procedure: CESAREAN SECTION;   Surgeon: Kathreen CosierBernard A Marshall, MD;  Location: [facility];  Service: Gynecology;  Laterality: N/A;  Primary Cesarean Section Delivery Baby Boy @ 0001, Apgars 8/9  . WISDOM TOOTH EXTRACTION  2010   Family History:  Family History  Problem Relation Age of Onset  . Hypertension Mother   . Hypertension Father    Family Psychiatric  History: denies history of mental illness in family,states there is a strong history of alcohol abuse in mother's family, no known suicides in family .  Social History:  Social History   Substance and Sexual Activity  Alcohol Use No     Social History   Substance and Sexual Activity  Drug Use No    Social History   Socioeconomic History  . Marital status: Single    Spouse name: None  . Number of children: None  . Years of education: None  . Highest education level: None  Social Needs  . Financial resource strain: None  . Food insecurity - worry: None  . Food insecurity - inability: None  . Transportation needs - medical: None  . Transportation needs - non-medical: None  Occupational History  . None  Tobacco Use  . Smoking status: Current Some Day Smoker    Types: Cigars  . Smokeless tobacco: Never Used  Substance and Sexual Activity  . Alcohol use: No  . Drug use: No  . Sexual activity: Not Currently    Birth control/protection: None  Other Topics Concern  . None  Social History Narrative  . None    Hospital Course:   [facility] Assessment: 27 y.o AAF, single, lives with her mother and her threechildren, unemployed. Background history of MDD and  AUD. Discharged from our unit and represented within twenty four hours.  Intake note copied below Patient was brought back into [facility] by a Staff Member after he found her in the parking lot stating thoughts of taking her life. Patient was reassess for suicidal thoughts by this Counselor; Patient reports having thoughts about taking her life if the movement in her stomach does not stop. Patient was  asked about a plan and she reports "possibly jumping off of something." Patient will be admitted to [facility]. Staff reports she has been interacting with peer. She has not been observed to be in pain or discomfort. She has not been observed to be internally stimulated. She has maintained normal biological functions Patient was playing a game Theme park manager) with peers. I observed her intensely engaged. She seems to be enjoying the game. At interview, patient tells me that after she was discharged, she went back home to her mother's place. Says everyone was ready to party at home. She just did not feel like it. Says she had a panic attack and came back to the hospital. Patient states that while she was here in the hospital, she felt maybe she was better off dead. Says she has not had any suicidal thoughts since she has been admitted. Says she spoke to her grand mother. She wanst to move in with her grand mother when she gets discharged. Says they all live close hence she can still see her children regularly. She has not had any panic attacks since she has been here. Denies any recent use of substances. No associated psychosis. No evidence of mania. No overwhelming anxiety. No evidence of PTSD.  No thoughts of harming others. No thoughts of violence. No access to weapons.   Patient remained on the [facility] unit for 3 days and stabilized with medication and therapy. Patient was continued on Abilify and titrated to 15 mg Daily, Celexa 20 mg Daily, Gabapentin 100 mg TID, and Trazodone and Vistaril PRN during stay. Patient showed improvement with improved mood, affect, sleep, appetite, and interaction. Patient reported no hallucinations after increased doses of Abilify. Her family was reporting that the family party never happened and that she had not talked to her grandmother when she reported that she had done so. Patient was able to get in touch with grandmother and she was agreeable top let the patient live with her in [place].  Patient was seen in the day room interacting with peers and staff appropriately. Patient attended groups and participated. Patient denies any SI/HI/AVH and contracts for safety. Patient agree to follow up at [facility] and then [facility] once arriving to [place]. Patient was provided with prescriptions for her medications upon discharge.    Physical Findings: AIMS: Facial and Oral Movements Muscles of Facial Expression: None, normal Lips and Perioral Area: None, normal Jaw: None, normal Tongue: None, normal,Extremity Movements Upper (arms, wrists, hands, fingers): None, normal Lower (legs, knees, ankles, toes): None, normal, Trunk Movements Neck, shoulders, hips: None, normal, Overall Severity Severity of abnormal movements (highest score from questions above): None, normal Incapacitation due to abnormal movements: None, normal Patient's awareness of abnormal movements (rate only patient's report): No Awareness, Dental Status Current problems with teeth and/or dentures?: No Does patient usually wear dentures?: No  CIWA:  CIWA-Ar Total: 1 COWS:  COWS Total Score: 2  Musculoskeletal: Strength & Muscle Tone: within normal limits Gait & Station: normal Patient leans: N/A  Psychiatric Specialty Exam: Physical Exam  Nursing note and vitals reviewed. Constitutional: She  is oriented to person, place, and time. She appears well-developed and well-nourished.  Cardiovascular: Normal rate.  Respiratory: Effort normal.  Musculoskeletal: Normal range of motion.  Neurological: She is alert and oriented to person, place, and time.  Skin: Skin is warm.    Review of Systems  Constitutional: Negative.   HENT: Negative.   Eyes: Negative.   Respiratory: Negative.   Cardiovascular: Negative.   Gastrointestinal: Negative.   Genitourinary: Negative.   Musculoskeletal: Negative.   Skin: Negative.   Neurological: Negative.   Endo/Heme/Allergies: Negative.    Psychiatric/Behavioral: Negative.     Blood pressure 118/71, pulse 90, temperature 98.6 F (37 C), temperature source Oral, resp. rate 16, height 5\' 5"  (1.651 m), weight 71.7 kg (158 lb), SpO2 100 %.Body mass index is 26.29 kg/m.  General Appearance: Casual  Eye Contact:  Good  Speech:  Clear and Coherent  Volume:  Normal  Mood:  Euthymic  Affect:  Congruent  Thought Process:  Goal Directed and Descriptions of Associations: Intact  Orientation:  Full (Time, Place, and Person)  Thought Content:  WDL  Suicidal Thoughts:  No  Homicidal Thoughts:  No  Memory:  Immediate;   Good Recent;   Good Remote;   Good  Judgement:  Good  Insight:  Good  Psychomotor Activity:  Normal  Concentration:  Concentration: Good and Attention Span: Good  Recall:  Good  Fund of Knowledge:  Good  Language:  Good  Akathisia:  No  Handed:  Right  AIMS (if indicated):     Assets:  Communication Skills Desire for Improvement Financial Resources/Insurance Housing Physical Health Social Support Transportation  ADL's:  Intact  Cognition:  WNL  Sleep:  Number of Hours: 5.5        Has this patient used any form of tobacco in the last 30 days? (Cigarettes, Smokeless Tobacco, Cigars, and/or Pipes) Yes, Yes, A prescription for an FDA-approved tobacco cessation medication was offered at discharge and the patient refused  Blood Alcohol level:  Lab Results  Component Value Date   [facility] <10 09/20/2017   ETH <10 06/17/2017    Metabolic Disorder Labs:  Lab Results  Component Value Date   HGBA1C 5.4 09/21/2017   MPG 108.28 09/21/2017   No results found for: PROLACTIN Lab Results  Component Value Date   CHOL 147 09/21/2017   TRIG 41 09/21/2017   HDL 45 09/21/2017   CHOLHDL 3.3 09/21/2017   VLDL 8 09/21/2017   LDLCALC 94 09/21/2017    See Psychiatric Specialty Exam and Suicide Risk Assessment completed by Attending Physician prior to discharge.  Discharge destination:  Home  Is patient on  multiple antipsychotic therapies at discharge:  No   Has Patient had three or more failed trials of antipsychotic monotherapy by history:  No  Recommended Plan for Multiple Antipsychotic Therapies: NA   Allergies as of 09/30/2017   No Known Allergies     Medication List    STOP taking these medications   ferrous sulfate 325 (65 FE) MG tablet   multivitamin with minerals Tabs tablet   naproxen sodium 220 MG tablet Commonly known as:  ALEVE     TAKE these medications     Indication  ARIPiprazole 15 MG tablet Commonly known as:  ABILIFY Take 1 tablet (15 mg total) by mouth daily. For mood control Start taking on:  10/01/2017 What changed:    medication strength  how much to take  Indication:  Hair Loss by Pulling or Twisting, mood stability  citalopram 20 MG tablet Commonly known as:  CELEXA Take 1 tablet (20 mg total) by mouth daily. For mood control Start taking on:  10/01/2017  Indication:  mood stability   gabapentin 100 MG capsule Commonly known as:  NEURONTIN Take 1 capsule (100 mg total) by mouth 3 (three) times daily. For withdrawal symptoms What changed:  additional instructions  Indication:  Agitation, Alcohol Withdrawal Syndrome   hydrOXYzine 25 MG tablet Commonly known as:  ATARAX/VISTARIL Take 1 tablet (25 mg total) by mouth 3 (three) times daily as needed for anxiety. What changed:  when to take this  Indication:  Feeling Anxious   traZODone 50 MG tablet Commonly known as:  DESYREL Take 1 tablet (50 mg total) by mouth at bedtime as needed for sleep.  Indication:  Trouble Sleeping      Follow-up [facility] [facility]. Go on 10/01/2017.   Specialty:  Behavioral Health Why:  Please attend your intake appointment on Thursday, 10/01/17, at 9:15am.  Please bring a copy of your hospital discharge paperwork. Contact information: [address] [place] [address] [phone]        [facility] [employer]. Go in 7 day(s).   Why:   Please attend a walk in appt Monday-Thursday between 8am-12noon.  Please bring your [place] Medicaid card, photo ID, proof of residence, and proof of household income. Contact information: [address] [place], [place] [address] P: [phone] F: [phone]          Follow-up recommendations:  Continue activity as tolerated. Continue diet as recommended by your PCP. Ensure to keep all appointments with outpatient providers.  Comments:  Patient is instructed prior to discharge to: Take all medications as prescribed by his/her mental healthcare provider. Report any adverse effects and or reactions from the medicines to his/her outpatient provider promptly. Patient has been instructed & cautioned: To not engage in alcohol and or illegal drug use while on prescription medicines. In the event of worsening symptoms, patient is instructed to call the crisis hotline, 911 and or go to the nearest ED for appropriate evaluation and treatment of symptoms. To follow-up with his/her primary care provider for your other medical issues, concerns and or health care needs.    Signed: Gerlene Burdock Money, FNP 09/30/2017, 10:38 AM  Patient seen, Suicide Assessment Completed.  Disposition Plan Reviewed

## 2017-09-30 NOTE — [facility]HH Suicide Risk Assessment (Addendum)
[facility] Discharge Suicide Risk Assessment   Principal Problem: MDD (major depressive disorder), single episode, severe with psychotic features [facility]) Discharge Diagnoses:  Patient Active Problem List   Diagnosis Date Noted  . MDD (major depressive disorder), single episode, severe with psychotic features (HCC) [F32.3] 09/26/2017  . Alcohol abuse [F10.10] 09/21/2017  . 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 Active labor [IMO0001] 01/04/2014  . NVD (normal vaginal delivery) [O80] 01/04/2014  . Cesarean delivery, without mention of indication, delivered, with or without mention of antepartum condition [O82] 10/18/2011    Total Time spent with patient: 30 minutes  Musculoskeletal: Strength & Muscle Tone: within normal limits Gait & Station: normal Patient leans: N/A  Psychiatric Specialty Exam: ROS denies headache, denies chest pain, no shortness of breath, no vomiting   Blood pressure 118/71, pulse 90, temperature 98.6 F (37 C), temperature source Oral, resp. rate 16, height 5\' 5"  (1.651 m), weight 71.7 kg (158 lb), SpO2 100 %.Body mass index is 26.29 kg/m.  General Appearance: improving grooming   Eye Contact::  Good  Speech:  Normal Rate409  Volume:  Decreased  Mood:  reports feeling better, less depressed   Affect:  less constricted   Thought Process:  Linear and Descriptions of Associations: Intact  Orientation:  Other:  fully alert and attentive   Thought Content:  at this time denies hallucinations and does not appear internally preoccupied .  Less somatically preoccupied, states she still experiences unusual abdominal sensations at times, but is less concerned about these now, and " can ignore it "  Suicidal Thoughts:  No denies suicidal or self injurious ideations , denies any homicidal or violent ideations   Homicidal Thoughts:  No  Memory:  recent and remote grossly intact   Judgement:  Fair- improved   Insight:   Fair- improved   Psychomotor Activity:  Normal  Concentration:  Good  Recall:  Good  Fund of Knowledge:Good  Language: Good  Akathisia:  Negative  Handed:  Right  AIMS (if indicated):   no abnormal or involuntary movements noted or reported   Assets:  Communication Skills Desire for Improvement Resilience  Sleep:  Number of Hours: 5.5  Cognition: WNL  ADL's:  Intact   Mental Status Per Nursing Assessment::   On Admission:     Demographic Factors:  27 year old female , was living with mother before admission, but at this time plans to relocate to [place], [place], where she plans to live with her mother   Loss Factors: Unemployment, family issues  Historical Factors: History of recent prior admission , history of depression, history of psychotic symptoms   Risk Reduction Factors:   Responsible for children under 30 years of age, Sense of responsibility to family and Positive coping skills or problem solving skills  Continued Clinical Symptoms:  Patient reports partial improvement compared to admission, and states she feels ready for discharge today. Reports some residual depression but states that she is feeling better. Denies hallucinations, does not appear internally preoccupied . Reports ongoing but less intense and less concerning somatic symptoms. Denies suicidal ideations, no homicidal or violent ideations, future oriented,states she thinks relocation to [facility] will be a positive for her. Denies medication side effects   Cognitive Features That Contribute To Risk:  No gross cognitive deficits noted upon discharge. Is alert , attentive, and oriented x 3   Suicide Risk:  Mild:  Suicidal ideation of limited frequency, intensity, duration, and specificity.  There are  no identifiable plans, no associated intent, mild dysphoria and related symptoms, good self-control (both objective and subjective assessment), few other risk factors, and identifiable protective factors, including  available and accessible social support.  Follow-up Information    [facility]. Go on 10/01/2017.   Specialty:  Behavioral Health Why:  Please attend your intake appointment on Thursday, 10/01/17, at 9:15am.  Please bring a copy of your hospital discharge paperwork. Contact information: [address] [place] [address] [phone]        [facility] [employer]. Go in 7 day(s).   Why:  Please attend a walk in appt Monday-Thursday between 8am-12noon.  Please bring your [place] Medicaid card, photo ID, proof of residence, and proof of household income. Contact information: [name] [place], [place] [address] P: [phone] F: [phone]          Plan Of Care/Follow-up recommendations:  Activity:  as tolerated  Diet:  regular Tests:  NA Other:  See below  Patient [place] go [place] with grandmother [place], [place] States mother will transport her there this week Plans to follow up as above We reviewed importance of avoiding cannabis and other illicit drugs as integral part of treatment goals.   Craige CottaFernando A Cobos, MD 09/30/2017, 10:11 AM

## 2017-09-30 NOTE — Progress Notes (Signed)
  [facility] Discharge Plan :  Will you be returning to the same living situation after discharge:  No.Pt is going to [place] with grandmother [place] At discharge, do you have transportation home?: Yes,  mother Do you have the ability to pay for your medications: Yes,  medicaid  Release of information consent forms completed and in the chart;  Patient's signature needed at discharge.  Patient to Follow up at: Follow-up Information    [facility]. Go on 10/01/2017.   Specialty:  Behavioral Health Why:  Please attend your intake appointment on Thursday, 10/01/17, at 9:15am.  Please bring a copy of your hospital discharge paperwork. Contact information: [address] [place] [address] [phone]        [facility] [employer]. Go in 7 day(s).   Why:  Please attend a walk in appt Monday-Thursday between 8am-12noon.  Please bring your [place] Medicaid card, photo ID, proof of residence, and proof of household income. Contact information: [name] [place], [place] [address] P: [phone] F: [phone]          Next level of care provider has [facility] to [facility] Link:no  Safety Planning and Suicide Prevention discussed: Yes,  with mother     Has patient been referred to the Quitline?: Yes, faxed on 09/25/17 after recent discharge  Patient has been referred for addiction treatment: Yes  [name], Chantale Leugers Jon, LCSW 09/30/2017, 10:51 AM

## 2017-09-30 NOTE — Progress Notes (Signed)
Recreation Therapy Notes  Date: 09/30/17 Time: 0930 Location: 300 Hall Dayroom  Group Topic: Stress Management  Goal Area(s) Addresses:  Patient will verbalize importance of using healthy stress management.  Patient will identify positive emotions associated with healthy stress management.   Intervention: Stress Management  Activity : Guided Imagery.  LRT introduced the stress management technique of guided imagery.  Patients were to follow along as LRT read a script on letting go of unnecessary baggage in order to embrace a new beginning.  Education: Stress Management, Discharge Planning.   Education Outcome: Acknowledges edcuation/In group clarification offered/Needs additional education  Clinical Observations/Feedback: Pt did not attend group.     Caroll RancherMarjette Berlinda Farve, LRT/CTRS          Caroll RancherLindsay, Chirstine Defrain A 09/30/2017 11:23 AM

## 2017-09-30 NOTE — ED Triage Notes (Signed)
Patient reports she is experiencing vaginal bleeding that started 2 days ago. Patient states she is pregnant but does not know how far along she is in the pregnancy. Patient denies having any prenatal care and confirmed with a home pregnancy test. Also, reports nausea and decreased urgency to urinate.

## 2017-09-30 NOTE — ED Notes (Signed)
On 09/20/2017, patient had a pregnancy test and it was negative with [facility].

## 2017-10-01 NOTE — ED Notes (Signed)
Pt called x3 for room. Last call, no response.

## 2017-10-01 NOTE — ED Notes (Signed)
Writer called 2X for room assignment, no response 

## 2018-02-18 IMAGING — CT CT ABD-PELV W/ CM
2 of 4 series · 17 of 46 positions shown, 19 images · IV contrast (omnipaque)
Comparison: None.

CLINICAL DATA: Lower abdominal pain for 1 week.

EXAM:
CT ABDOMEN AND PELVIS WITH CONTRAST
TECHNIQUE: Multidetector CT imaging of the abdomen and pelvis was performed
using the standard protocol following bolus administration of
intravenous contrast.
CONTRAST:  50mL OMNIPAQUE IOHEXOL 300 MG/ML SOLN, 100mL 4FCVAD-R44
IOPAMIDOL (4FCVAD-R44) INJECTION 61%

[Series 2: abd/pel with · axial · 0.72mm/px · z∈[-377,+8]mm · 14 of 85 slices shown, 16 images]
[im 4/85  soft-tissue]
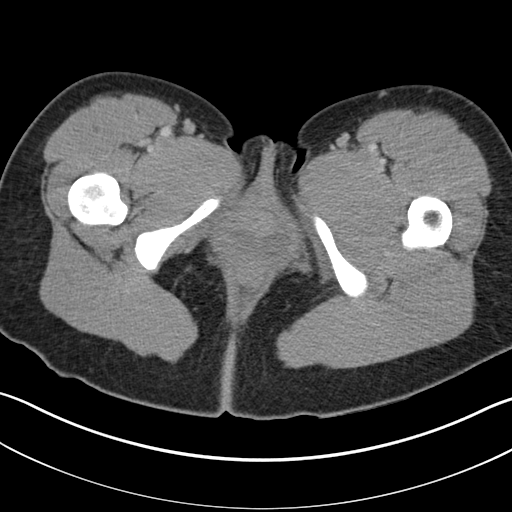
[im 4/85  bone]
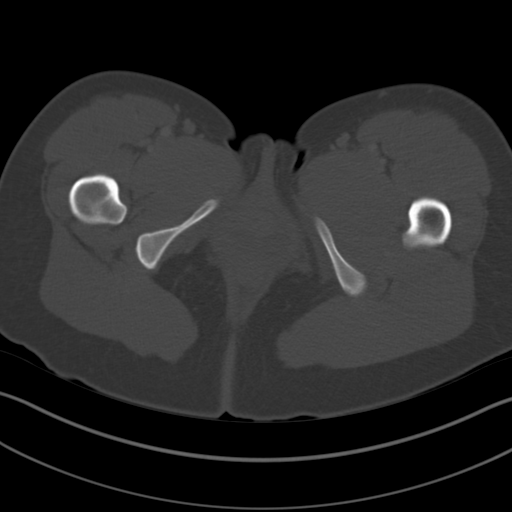
[im 11/85  soft-tissue]
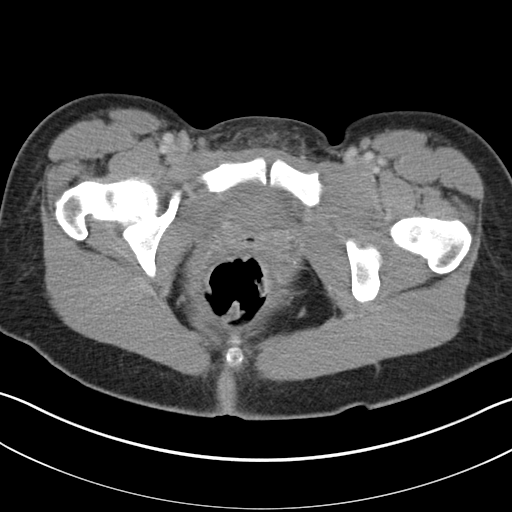
[im 15/85  soft-tissue]
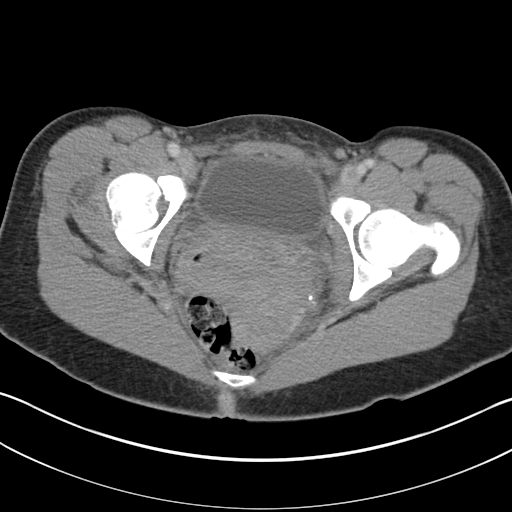
[im 22/85  soft-tissue]
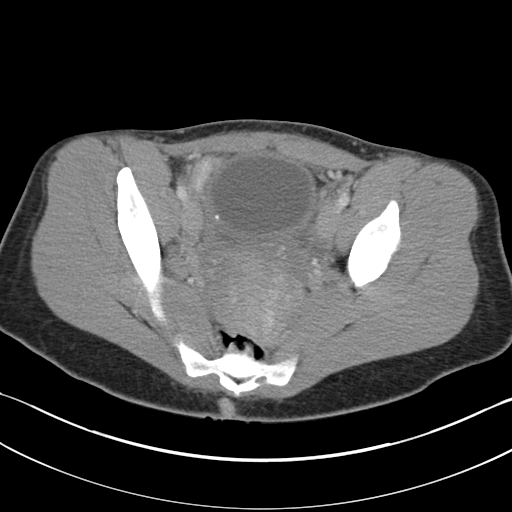
[im 30/85  soft-tissue]
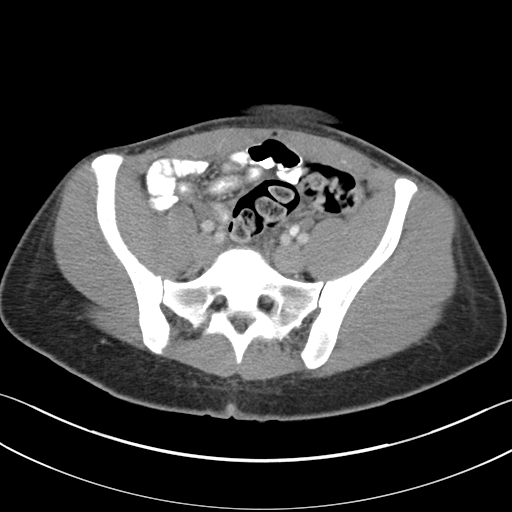
[im 33/85  soft-tissue]
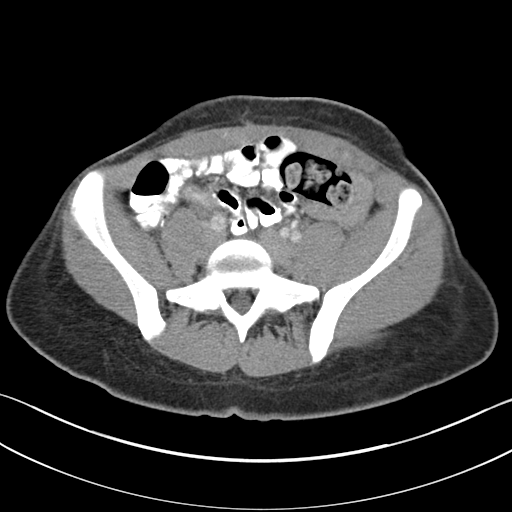
[im 41/85  soft-tissue]
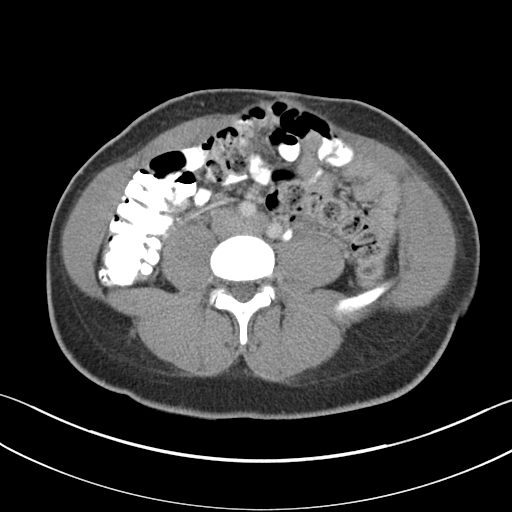
[im 44/85  soft-tissue]
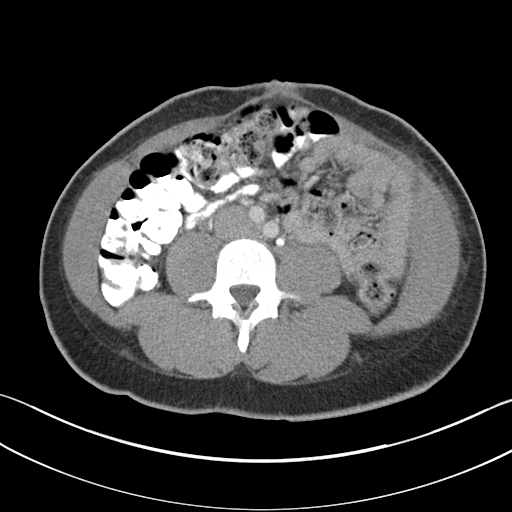
[im 52/85  soft-tissue]
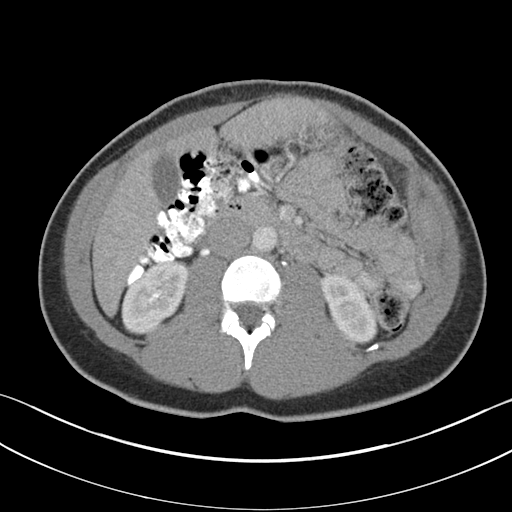
[im 52/85  bone]
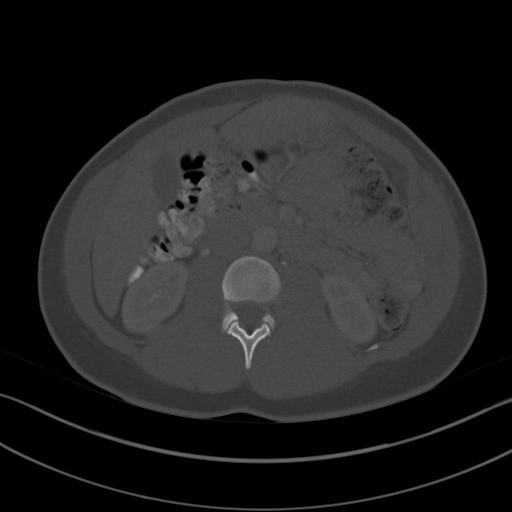
[im 55/85  soft-tissue]
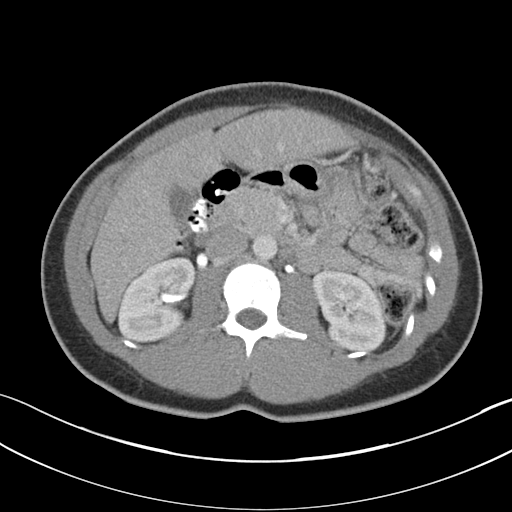
[im 63/85  soft-tissue]
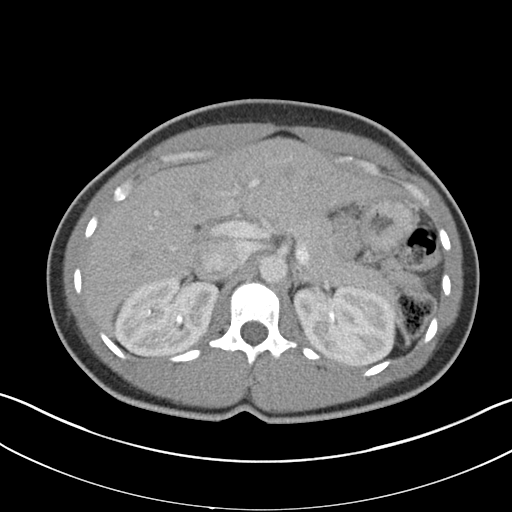
[im 70/85  soft-tissue]
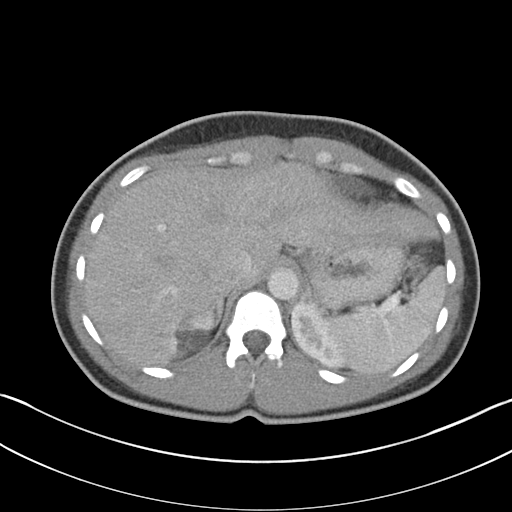
[im 74/85  soft-tissue]
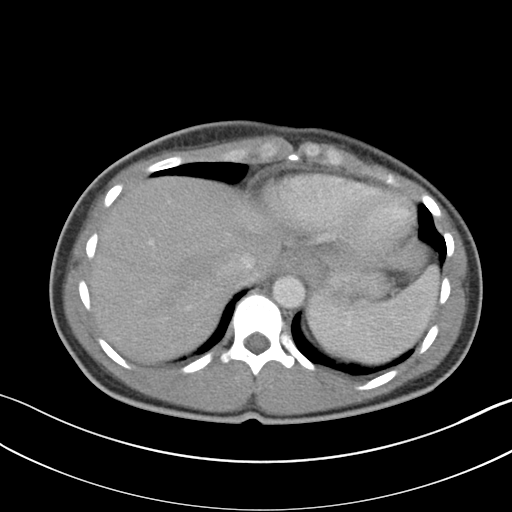
[im 81/85  soft-tissue]
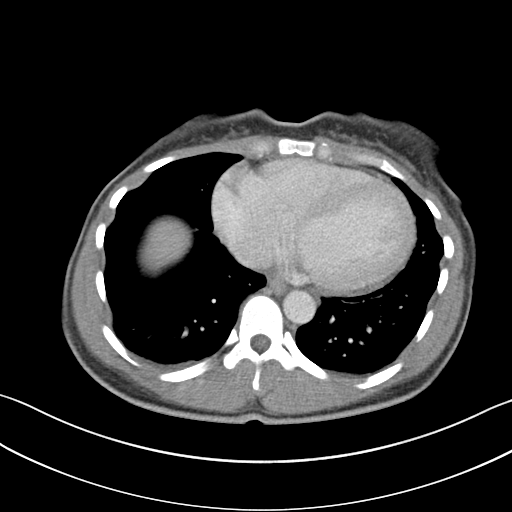

[Series 5: coronal a/|p · coronal · 0.75mm/px · 3 of 141 slices shown]
[im 47/141  soft-tissue]
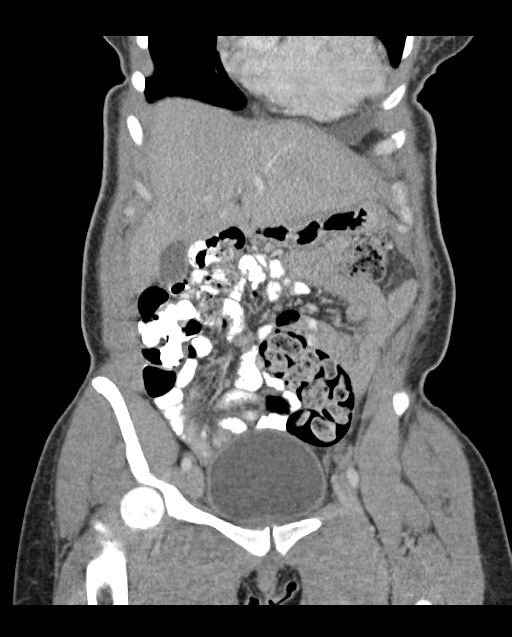
[im 63/141  soft-tissue]
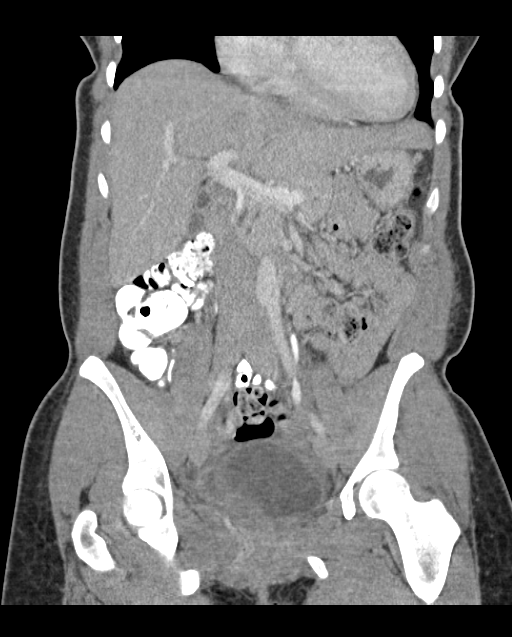
[im 78/141  soft-tissue]
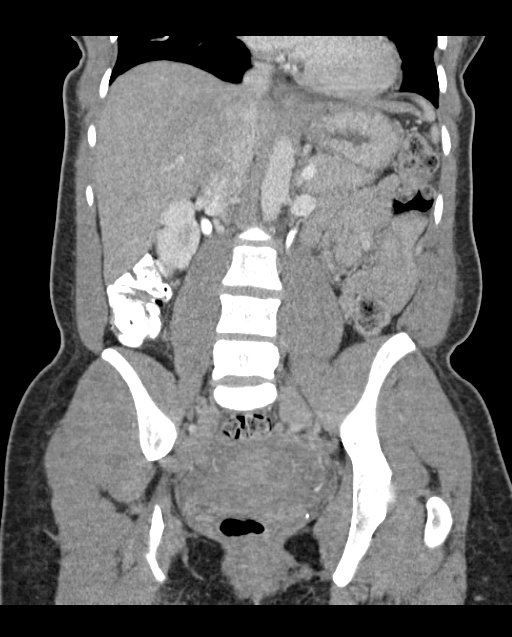

[17 of 46 positions shown; findings below may reference images not displayed]

FINDINGS: Minimal dependent atelectasis

Liver, gallbladder, spleen, pancreas, adrenal glands are within
normal limits.

Simple cyst in the upper pole of the right kidney is lobulated
likely due to minimal intra-abdominal fat and crowding of the
organs. Left kidney is unremarkable.

Normal appendix.

Bladder and adnexa are within normal limits.  Uterus is retroverted.

Stool, gas, and contrast is present throughout the colon. No
disproportionate dilatation of bowel

There is laxity of the anterior abdominal wall with diastasis of the
rectus abdominus muscles.

Prominent stool burden in the transverse, descending, and sigmoid
colon.
IMPRESSION: No acute intra-abdominal pathology. Normal appendix. Prominent stool
burden in the colon.

## 2018-05-19 ENCOUNTER — Other Ambulatory Visit: Payer: Self-pay

## 2018-05-19 ENCOUNTER — Encounter (HOSPITAL_COMMUNITY): Payer: Self-pay | Admitting: Obstetrics and Gynecology

## 2018-05-19 ENCOUNTER — Emergency Department (HOSPITAL_COMMUNITY)
Admission: EM | Admit: 2018-05-19 | Discharge: 2018-05-19 | Disposition: A | Discharge status: Home / Self Care | Payer: Medicaid Other | Attending: Emergency Medicine | Admitting: Emergency Medicine

## 2018-05-19 DIAGNOSIS — Z79899 Other long term (current) drug therapy: Secondary | ICD-10-CM | POA: Insufficient documentation

## 2018-05-19 DIAGNOSIS — R519 Headache, unspecified: Secondary | ICD-10-CM

## 2018-05-19 DIAGNOSIS — R51 Headache: Secondary | ICD-10-CM | POA: Insufficient documentation

## 2018-05-19 DIAGNOSIS — F17290 Nicotine dependence, other tobacco product, uncomplicated: Secondary | ICD-10-CM | POA: Insufficient documentation

## 2018-05-19 MED ORDER — BUTALBITAL-APAP-CAFFEINE 50-325-40 MG PO TABS: 1.0000 | ORAL_TABLET | Freq: Four times a day (QID) | ORAL | 0 refills | Status: DC | PRN

## 2018-05-19 NOTE — Discharge Instructions (Signed)
You are seen in the ER for a headache.  You declined headache medicine.  You can take Fioricet for headaches that are not responding to Tylenol.  It is important to follow-up with neurology for further evaluation of your frequent headaches, they may be able to prescribe you preventative for abortive medications.  Return to the ER if you develop:  Your headache becomes severe. You have repeated vomiting. You have a stiff neck. You have a loss of vision. You have problems with speech. You have pain in the eye or ear. You have muscular weakness or loss of muscle control. You lose your balance or have trouble walking. You feel faint or pass out. You have confusion.

## 2018-05-19 NOTE — ED Notes (Signed)
Pt left prior to receiving discharge paperwork and instructions or having vitals and pain reassessed

## 2018-05-19 NOTE — ED Triage Notes (Signed)
Pt reports migraines with nausea the last few days. Pt reports she has not had emesis today. Pt reports she was around her daughter who had a stomach bug.  Pt reports she has no hx of high BP

## 2018-05-19 NOTE — ED Provider Notes (Signed)
[facility]-EMERGENCY DEPT Provider Note   CSN: [id number] Arrival date & time: 05/19/18  1645     History   Chief Complaint Chief Complaint  Patient presents with  . Migraine  . Nausea    HPI Nicole [name] is a 27 y.o. female with history of depression with psychotic features, migraines is here for evaluation of "migraine".  Onset last night, constant, localized to the right forehead, nonradiating.  States she has a long history of migraines that she gets daily and symptoms today are similar.  Associated with lightheadedness and nausea and phonophobia which are also typical of her headaches.  Earlier today she had a burning sensation to her hands and feet that has resolved.  Typically takes acetaminophen 500 mg but this did not help.  Pain is an 8/10.  No alleviating or aggravating factors.  Denies associated fevers, vision changes, neck stiffness or pain, vomiting, preceding head trauma.  No anticoagulants.  She is asking for a prescription for something to take for her headaches, thinks that acetaminophen is no longer helping.  States she has not followed up with neurology in a very long time.  Admits to recently cutting back on caffeine.  HPI  Past Medical History:  Diagnosis Date  . Major depression   . Pregnancy induced hypertension 2011    Patient Active Problem List   Diagnosis Date Noted  . MDD (major depressive disorder), single episode, severe with psychotic features (HCC) 09/26/2017  . Alcohol abuse 09/21/2017  . Major depressive disorder, recurrent severe without psychotic features (HCC) 09/20/2017  . Active labor 01/04/2014  . NVD (normal vaginal delivery) 01/04/2014  . Cesarean delivery, without mention of indication, delivered, with or without mention of antepartum condition 10/18/2011    Past Surgical History:  Procedure Laterality Date  . CESAREAN SECTION  10/14/2011   Procedure: CESAREAN SECTION;  Surgeon: Kathreen Cosier, MD;  Location:  [facility] ORS;  Service: Gynecology;  Laterality: N/A;  Primary Cesarean Section Delivery Baby Boy @ 0001, Apgars 8/9  . WISDOM TOOTH EXTRACTION  2010     OB History    Gravida  5   Para  3   Term  3   Preterm  0   AB  0   Living  3     SAB  0   TAB  0   Ectopic  0   Multiple  0   Live Births  3            Home Medications    Prior to Admission medications   Medication Sig Start Date End Date Taking? Authorizing Provider  acetaminophen (TYLENOL) 325 MG tablet Take 325 mg by mouth every 6 (six) hours as needed for mild pain or headache.   Yes [provider]  ARIPiprazole (ABILIFY) 15 MG tablet Take 1 tablet (15 mg total) by mouth daily. For mood control Patient not taking: Reported on 05/19/2018 10/01/17   Money, Gerlene Burdock, FNP  butalbital-acetaminophen-caffeine (FIORICET, ESGIC) [phone] MG tablet Take 1-2 tablets by mouth every 6 (six) hours as needed for headache. 05/19/18 05/19/19  Liberty Handy, PA-C  citalopram (CELEXA) 20 MG tablet Take 1 tablet (20 mg total) by mouth daily. For mood control Patient not taking: Reported on 05/19/2018 10/01/17   Money, Gerlene Burdock, FNP  gabapentin (NEURONTIN) 100 MG capsule Take 1 capsule (100 mg total) by mouth 3 (three) times daily. For withdrawal symptoms Patient not taking: Reported on 05/19/2018 09/30/17   Money, Gerlene Burdock,  FNP  hydrOXYzine (ATARAX/VISTARIL) 25 MG tablet Take 1 tablet (25 mg total) by mouth 3 (three) times daily as needed for anxiety. Patient not taking: Reported on 05/19/2018 09/30/17   Money, Gerlene Burdock, FNP  traZODone (DESYREL) 50 MG tablet Take 1 tablet (50 mg total) by mouth at bedtime as needed for sleep. Patient not taking: Reported on 05/19/2018 09/30/17   Money, Gerlene Burdock, FNP    Family History Family History  Problem Relation Age of Onset  . Hypertension Mother   . Hypertension Father     Social History Social History   Tobacco Use  . Smoking status: Current Some Day Smoker    Types: Cigars  .  Smokeless tobacco: Never Used  Substance Use Topics  . Alcohol use: Yes    Comment: Social  . Drug use: No    Comment: Hx of Marijuana      Allergies   Patient has no known allergies.   Review of Systems Review of Systems  Gastrointestinal: Positive for nausea.  Neurological: Positive for light-headedness and headaches.       Phonophobia  All other systems reviewed and are negative.    Physical Exam Updated Vital Signs BP 119/77 (BP Location: Right Arm)   Pulse 69   Temp 98.4 F (36.9 C) (Oral)   Resp 18   LMP  (LMP Unknown)   SpO2 98%   Breastfeeding? Unknown   Physical Exam  Constitutional: She appears well-developed.  Non-toxic appearance.  NAD.  HENT:  Head: Normocephalic and atraumatic.  Right Ear: External ear normal.  Left Ear: External ear normal.  Nose: Nose normal. No mucosal edema or septal deviation.  Moist mucous membranes Uvula midline Oropharynx and tonsils normal No tenderness over temporal arteries  Eyes: Conjunctivae and lids are normal.  Unable to visualize back of eye  Neck:  No c spine spinous process or muscular tenderness  Full PROM of neck w/o rigidity  No meningeal signs   Cardiovascular: Normal rate, regular rhythm and normal heart sounds.  Pulses:      Radial pulses are 2+ on the right side, and 2+ on the left side.       Dorsalis pedis pulses are 2+ on the right side, and 2+ on the left side.  Pulmonary/Chest: Effort normal and breath sounds normal.  Lymphadenopathy:  No cervical adenopathy  Neurological: She is alert. GCS eye subscore is 4. GCS verbal subscore is 5. GCS motor subscore is 6.  Speech is fluent without aphasia. Strength 5/5 with hand grip and ankle F/E.   Sensation to light touch intact in hands and feet. Normal gait. No pronator drift.  Normal finger-to-nose and heel-to-shin test.  CN I and VIII not tested. CN II-XII intact bilaterally.   Skin: Skin is warm and dry. Capillary refill takes less than 2 seconds.  No rash noted.  Psychiatric: She has a normal mood and affect. Her speech is normal and behavior is normal. Judgment and thought content normal.     ED Treatments / Results  Labs (all labs ordered are listed, but only abnormal results are displayed) Labs Reviewed - No data to display  EKG None  Radiology No results found.  Procedures Procedures (including critical care time)  Medications Ordered in ED Medications - No data to display   Initial Impression / Assessment and Plan / ED Course  I have reviewed the triage vital signs and the nursing notes.  Pertinent labs & imaging results that were available during my care of  the patient were reviewed by me and considered in my medical decision making (see chart for details).     Patient is a 27 y.o. yo female with ppmh who presents with headache. Pt reports current headache is "just like previous migraines".  Patient is without high-risk features of headache including: sudden onset/thunderclap HA, AMS, seizure, headache with exertion, age > 56, history of immunocompromise, neck rigidity, fever, use of anticoagulation, FMH of spontaneous SAH, concomitant drug use or toxic exposure, trauma, h/o CVA/TIA or HTN. On exam BP initially mildly elevated but this resolved without intervention, pt is well-appearing w/ no meningismus, nystagmus, focal neuro deficits, pain over temporal arteries.  Given reassuring hx and exam, improved BP, emergent imaging or labs not indicated given. Low suspicion for emergent intracranial or vascular etiology.  Pt offered migraine cocktail but she declined, requested prescription for something stronger than tylenol.  Prescribed fioricet.  Encouraged f/u with neurology for further long term tx of headaches.  Discussed s/s that would warrant return to ED. Pt verbalized understanding and agreeable with ED tx and dc plan.   Final Clinical Impressions(s) / ED Diagnoses   Final diagnoses:  Frontal headache    ED  Discharge Orders         Ordered    butalbital-acetaminophen-caffeine (FIORICET, ESGIC) 50-325-40 MG tablet  Every 6 hours PRN     05/19/18 1908           Liberty Handy, PA-C 05/19/18 1911    Mancel Bale, MD 05/21/18 0020

## 2018-08-18 ENCOUNTER — Encounter (HOSPITAL_COMMUNITY): Payer: Self-pay | Admitting: Emergency Medicine

## 2018-08-18 ENCOUNTER — Emergency Department (HOSPITAL_COMMUNITY)
Admission: EM | Admit: 2018-08-18 | Discharge: 2018-08-18 | Disposition: A | Discharge status: Other Acute Inpatient Hospital | Payer: Medicaid Other | Attending: Emergency Medicine | Admitting: Emergency Medicine

## 2018-08-18 ENCOUNTER — Other Ambulatory Visit: Payer: Self-pay

## 2018-08-18 ENCOUNTER — Inpatient Hospital Stay (HOSPITAL_COMMUNITY)
Admission: AD | Admit: 2018-08-18 | Discharge: 2018-08-25 | DRG: 885 | Disposition: A | Discharge status: Home / Self Care | Payer: Medicaid Other | Source: Intra-hospital | Attending: Psychiatry | Admitting: Psychiatry

## 2018-08-18 DIAGNOSIS — F332 Major depressive disorder, recurrent severe without psychotic features: Secondary | ICD-10-CM

## 2018-08-18 DIAGNOSIS — F333 Major depressive disorder, recurrent, severe with psychotic symptoms: Principal | ICD-10-CM | POA: Diagnosis present

## 2018-08-18 DIAGNOSIS — F419 Anxiety disorder, unspecified: Secondary | ICD-10-CM

## 2018-08-18 DIAGNOSIS — G4700 Insomnia, unspecified: Secondary | ICD-10-CM | POA: Diagnosis not present

## 2018-08-18 DIAGNOSIS — Z9119 Patient's noncompliance with other medical treatment and regimen: Secondary | ICD-10-CM | POA: Insufficient documentation

## 2018-08-18 DIAGNOSIS — Z79899 Other long term (current) drug therapy: Secondary | ICD-10-CM | POA: Insufficient documentation

## 2018-08-18 DIAGNOSIS — Z23 Encounter for immunization: Secondary | ICD-10-CM

## 2018-08-18 DIAGNOSIS — F1290 Cannabis use, unspecified, uncomplicated: Secondary | ICD-10-CM | POA: Diagnosis present

## 2018-08-18 DIAGNOSIS — F17290 Nicotine dependence, other tobacco product, uncomplicated: Secondary | ICD-10-CM | POA: Diagnosis present

## 2018-08-18 DIAGNOSIS — R45851 Suicidal ideations: Secondary | ICD-10-CM | POA: Diagnosis present

## 2018-08-18 DIAGNOSIS — F515 Nightmare disorder: Secondary | ICD-10-CM | POA: Diagnosis present

## 2018-08-18 DIAGNOSIS — Z915 Personal history of self-harm: Secondary | ICD-10-CM | POA: Insufficient documentation

## 2018-08-18 DIAGNOSIS — F1220 Cannabis dependence, uncomplicated: Secondary | ICD-10-CM | POA: Insufficient documentation

## 2018-08-18 LAB — COMPREHENSIVE METABOLIC PANEL
ALT: 14 U/L (ref 0–44)
AST: 22 U/L (ref 15–41)
Albumin: 4.2 g/dL (ref 3.5–5.0)
Alkaline Phosphatase: 53 U/L (ref 38–126)
Anion gap: 7 (ref 5–15)
BUN: 9 mg/dL (ref 6–20)
CO2: 25 mmol/L (ref 22–32)
Calcium: 8.8 mg/dL — ABNORMAL LOW (ref 8.9–10.3)
Chloride: 109 mmol/L (ref 98–111)
Creatinine, Ser: 0.63 mg/dL (ref 0.44–1.00)
GFR calc Af Amer: >60 mL/min (ref >60)
GFR calc non Af Amer: >60 mL/min (ref >60)
GLUCOSE: 98 mg/dL (ref 70–99)
Potassium: 3.2 mmol/L — ABNORMAL LOW (ref 3.5–5.1)
Sodium: 141 mmol/L (ref 135–145)
Total Bilirubin: 0.8 mg/dL (ref 0.3–1.2)
Total Protein: 7 g/dL (ref 6.5–8.1)

## 2018-08-18 LAB — CBC
HCT: 31.6 % — ABNORMAL LOW (ref 36.0–46.0)
Hemoglobin: 9.7 g/dL — ABNORMAL LOW (ref 12.0–15.0)
MCH: 25.3 pg — ABNORMAL LOW (ref 26.0–34.0)
MCHC: 30.7 g/dL (ref 30.0–36.0)
MCV: 82.5 fL (ref 80.0–100.0)
NRBC: 0 % (ref 0.0–0.2)
Platelets: 365 10*3/uL (ref 150–400)
RBC: 3.83 MIL/uL — ABNORMAL LOW (ref 3.87–5.11)
RDW: 16.4 % — ABNORMAL HIGH (ref 11.5–15.5)
WBC: 4.8 10*3/uL (ref 4.0–10.5)

## 2018-08-18 LAB — RAPID URINE DRUG SCREEN, HOSP PERFORMED
Amphetamines: NOT DETECTED
BENZODIAZEPINES: NOT DETECTED
Barbiturates: NOT DETECTED
Cocaine: NOT DETECTED
Opiates: NOT DETECTED
Tetrahydrocannabinol: POSITIVE — AB

## 2018-08-18 LAB — ACETAMINOPHEN LEVEL: Acetaminophen (Tylenol), Serum: <10 ug/mL — ABNORMAL LOW (ref 10–30)

## 2018-08-18 LAB — SALICYLATE LEVEL: Salicylate Lvl: <7 mg/dL (ref 2.8–30.0)

## 2018-08-18 LAB — ETHANOL: Alcohol, Ethyl (B): <10 mg/dL (ref <10)

## 2018-08-18 LAB — I-STAT BETA HCG BLOOD, ED (MC, WL, AP ONLY)

## 2018-08-18 MED ORDER — ACETAMINOPHEN 325 MG PO TABS
650.0000 mg | ORAL_TABLET | Freq: Four times a day (QID) | ORAL | Status: DC | PRN
  Administered 2018-08-18 – 2018-08-24 (×11): 650 mg via ORAL
  Filled 2018-08-18 (×12): qty 2

## 2018-08-18 MED ORDER — PERPHENAZINE 2 MG PO TABS
2.0000 mg | ORAL_TABLET | Freq: Two times a day (BID) | ORAL | Status: DC
  Administered 2018-08-18 – 2018-08-19 (×3): 2 mg via ORAL
  Filled 2018-08-18 (×6): qty 1

## 2018-08-18 MED ORDER — NICOTINE 21 MG/24HR TD PT24
21.0000 mg | MEDICATED_PATCH | Freq: Every day | TRANSDERMAL | Status: DC
  Administered 2018-08-19 – 2018-08-25 (×7): 21 mg via TRANSDERMAL
  Filled 2018-08-18 (×11): qty 1

## 2018-08-18 MED ORDER — LORAZEPAM 1 MG PO TABS: 1.0000 mg | ORAL_TABLET | ORAL | Status: DC | PRN

## 2018-08-18 MED ORDER — ALUM & MAG HYDROXIDE-SIMETH 200-200-20 MG/5ML PO SUSP: 30.0000 mL | ORAL | Status: DC | PRN

## 2018-08-18 MED ORDER — POTASSIUM CHLORIDE CRYS ER 20 MEQ PO TBCR
20.0000 meq | EXTENDED_RELEASE_TABLET | Freq: Once | ORAL | Status: AC
  Administered 2018-08-18: 20 meq via ORAL
  Filled 2018-08-18: qty 1

## 2018-08-18 MED ORDER — PRAZOSIN HCL 2 MG PO CAPS
2.0000 mg | ORAL_CAPSULE | Freq: Every day | ORAL | Status: DC
  Administered 2018-08-18 – 2018-08-24 (×7): 2 mg via ORAL
  Filled 2018-08-18 (×2): qty 1
  Filled 2018-08-18: qty 7
  Filled 2018-08-18: qty 2
  Filled 2018-08-18 (×6): qty 1

## 2018-08-18 MED ORDER — CLONAZEPAM 1 MG PO TABS
1.0000 mg | ORAL_TABLET | Freq: Every day | ORAL | Status: DC
  Administered 2018-08-18: 1 mg via ORAL
  Filled 2018-08-18: qty 1

## 2018-08-18 MED ORDER — INFLUENZA VAC SPLIT QUAD 0.5 ML IM SUSY
0.5000 mL | PREFILLED_SYRINGE | INTRAMUSCULAR | Status: AC
  Administered 2018-08-19: 0.5 mL via INTRAMUSCULAR
  Filled 2018-08-18: qty 0.5

## 2018-08-18 MED ORDER — MAGNESIUM HYDROXIDE 400 MG/5ML PO SUSP: 30.0000 mL | Freq: Every day | ORAL | Status: DC | PRN

## 2018-08-18 NOTE — ED Notes (Signed)
[facility] [employer] [facility]. All belongings returned to pt who signed for same. Pt was cooperative and calm.

## 2018-08-18 NOTE — Progress Notes (Signed)
Pt was observed in the dayroom, attending wrap-up group. Pt appears animated/anxious in affect and mood; brightens on approach. Pt denies SI/HI/AVH at this time. Pt rates pain 7/10; Headache. Pt states she experiences night terrors and been increasingly anxious. No behavioral issues noted. Support and encouragement offered. Will continue with POC.
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
## 2018-08-18 NOTE — Progress Notes (Signed)
Recreation Therapy Notes  INPATIENT RECREATION THERAPY ASSESSMENT  Patient Details Name: Nicole [name] MRN: [id number] DOB: May 09, 1991 Today's Date: 08/18/2018       Information Obtained From: Patient  Able to Participate in Assessment/Interview: Yes  Patient Presentation: Alert  Reason for Admission (Per Patient): Other (Comments)(Pt stated she wasn't sleeping)  Patient Stressors: Family, Other (Comment)(Holiday season; Family being in town)  Coping Skills:   Film/video editorsolation, Journal, Sports, Arguments, Aggression, Music, Exercise, Meditate, Deep Breathing, Art, Prayer, Avoidance, Read, Hot Bath/Shower  Leisure Interests (2+):  [employer] - Other (Comment), Sports - Football(Football with son; Go to the park)  Frequency of Recreation/Participation: Weekly  Awareness of Community Resources:  Yes  Community Resources:  [facility], [place]  Current Use: Yes  If no, Barriers?:    Expressed Interest in [employer] Resource Information: No  [place] of [place]  Patient Main Form of Transportation: Public Transportation(Sometines walk)  Patient Strengths:  Staying to self; 

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
## 2018-08-18 NOTE — [facility] Group Notes (Signed)
[facility] Group Notes:  (Nursing/MHT/Case Management/Adjunct)  Date:  08/18/2018  Time:  1:15 PM  Type of Therapy:  Nurse Education  Participation Level:  Active  Participation Quality:  Appropriate and Attentive  Affect:  Appropriate  Cognitive:  Alert and Appropriate  Insight:  Appropriate  Engagement in Group:  Engaged  Modes of Intervention:  Activity, Rapport Building and Socialization  Summary of Progress/Problems: pt's were led in group with rapport building and socialization through interconnected play.  Suszanne ConnersMichael R Zynasia Burklow 08/18/2018, 3:48 PM

## 2018-08-18 NOTE — [facility] Suicide Risk Assessment (Signed)
[facility]   Nursing information obtained from:  Patient Demographic factors:  NA Current Mental Status:  NA(pt denies SI) Loss Factors:  NA Historical Factors:  Prior suicide attempts Risk Reduction Factors:   n/a  Total Time spent with patient: 45 minutes Principal Problem: <principal problem not specified> Diagnosis:  Active Problems:   Severe recurrent major depression w/psychotic features, mood-congruent (HCC)  Subjective Data: See HPI for further history  Continued Clinical Symptoms:    The "Alcohol Use Disorders Identification Test", Guidelines for Use in Primary Care, Second Edition.  World Science writerHealth Organization [facility](WHO). Score between 0-7:  no or low risk or alcohol related problems. Score between 8-15:  moderate risk of alcohol related problems. Score between 16-19:  high risk of alcohol related problems. Score 20 or above:  warrants further diagnostic evaluation for alcohol dependence and treatment.   CLINICAL FACTORS:   Dysthymia   Musculoskeletal: Strength & Muscle Tone: within normal limits Gait & Station: normal Patient leans: N/A  Psychiatric Specialty Exam: Physical Exam  ROS  Blood pressure 131/81, pulse 64, temperature 98.7 F (37.1 C), temperature source Oral, resp. rate 18, height 5\' 5"  (1.651 m), weight 65.8 kg, last menstrual period 08/13/2018, SpO2 98 %, unknown if currently breastfeeding.Body mass index is 24.13 kg/m.  General Appearance: Casual  Eye Contact:  Minimal  Speech:  Clear and Coherent  Volume:  Decreased  Mood:  Anxious and Depressed  Affect:  Congruent  Thought Process:  Disorganized and Irrelevant  Orientation:  Full (Time, Place, and Person)  Thought Content:  Delusions  Suicidal Thoughts:  Yes no plan/intent  Homicidal Thoughts:  No  Memory:  Immediate;   Fair  Judgement:  Fair  Insight:  Fair  Psychomotor Activity:  Decreased  Concentration:  Concentration: Poor  Recall:  [employer]  Fund of Knowledge:   Fair  Language:  Fair  Akathisia:  Negative  Handed:  Right  AIMS (if indicated):     Assets:  Communication Skills  ADL's:  Intact  Cognition:  WNL  Sleep:         COGNITIVE FEATURES THAT CONTRIBUTE TO RISK:  Loss of executive function    SUICIDE RISK:   Minimal: No identifiable suicidal ideation.  Patients presenting with no risk factors but with morbid ruminations; may be classified as minimal risk based on the severity of the depressive symptoms  PLAN OF CARE: see orders  I certify that inpatient services furnished can reasonably be expected to improve the patient's condition.   Malvin JohnsFARAH,Melquisedec Journey, MD 08/18/2018, 2:02 PM

## 2018-08-18 NOTE — ED Notes (Signed)
TTS assessment in progress. 

## 2018-08-18 NOTE — ED Notes (Signed)
[facility] called.

## 2018-08-18 NOTE — ED Triage Notes (Signed)
Pt reports having suicidal ideation without a plan for the last several weeks. Pt reports not being able to sleep.

## 2018-08-18 NOTE — ED Notes (Addendum)
Pt A&O x 3, no distress noted, calm & cooperative, pt presents with HI, no plan towards family members in her house.  Pt has not taken meds in 6 months.  Monitoring for safety, Q 15 min checks in effect.  Pt admits to history of Anxiety and Depression..Marland Kitchen

## 2018-08-18 NOTE — Tx Team (Signed)
Initial Treatment Plan 08/18/2018 2:51 PM Nicole PaulaKashia Lacount [id number]    PATIENT STRESSORS: Marital or family conflict Other: "Christmas, this time of year"   PATIENT STRENGTHS: Ability for insight Motivation for treatment/growth   PATIENT IDENTIFIED PROBLEMS: "controlling things I say"  "angry outbursts"  "expressing myself without being aggressive"                 DISCHARGE CRITERIA:  Improved stabilization in mood, thinking, and/or behavior Reduction of life-threatening or endangering symptoms to within safe limits Verbal commitment to aftercare and medication compliance  PRELIMINARY DISCHARGE PLAN: Outpatient therapy Return to previous living arrangement Return to previous work or school arrangements  PATIENT/FAMILY INVOLVEMENT: This treatment plan has been presented to and reviewed with the patient, Nicole [name], and/or family member.  The patient and family have been given the opportunity to ask questions and make suggestions.  Tania AdeAdams, Nicole Byrns C, RN 08/18/2018, 2:51 PM

## 2018-08-18 NOTE — ED Provider Notes (Signed)
6:37 AM  [facility].   Nicole [name], [name], DO 08/18/18 [phone]

## 2018-08-18 NOTE — ED Provider Notes (Signed)
[facility]-EMERGENCY DEPT Provider Note   CSN: [id number] Arrival date & time: 08/18/18  0310     History   Chief Complaint Chief Complaint  Patient presents with  . Suicidal    HPI Nicole [name] is a 27 y.o. female.   27 year old female presents to the emergency department for worsening depression.  States that she has been off of her medications for 6 months as she neglected to follow-up with [facility].  Notes suicidal thoughts over the past 1.5 weeks.  Has been trying to remain preoccupied for management of her depression and anxiety.  Denies any suicidal plan.  She has a history of overdose in the past.  No alcohol or illicit drug use recently.  The history is provided by the patient. No language interpreter was used.    Past Medical History:  Diagnosis Date  . Major depression   . Pregnancy induced hypertension 2011    Patient Active Problem List   Diagnosis Date Noted  . MDD (major depressive disorder), single episode, severe with psychotic features (HCC) 09/26/2017  . Alcohol abuse 09/21/2017  . Major depressive disorder, recurrent severe without psychotic features (HCC) 09/20/2017  . Active labor 01/04/2014  . NVD (normal vaginal delivery) 01/04/2014  . Cesarean delivery, without mention of indication, delivered, with or without mention of antepartum condition 10/18/2011    Past Surgical History:  Procedure Laterality Date  . CESAREAN SECTION  10/14/2011   Procedure: CESAREAN SECTION;  Surgeon: Kathreen Cosier, MD;  Location: [facility];  Service: Gynecology;  Laterality: N/A;  Primary Cesarean Section Delivery Baby Boy @ 0001, Apgars 8/9  . WISDOM TOOTH EXTRACTION  2010     OB History    Gravida  5   Para  3   Term  3   Preterm  0   AB  0   Living  3     SAB  0   TAB  0   Ectopic  0   Multiple  0   Live Births  3            Home Medications    Prior to Admission medications   Medication Sig Start Date End Date  Taking? Authorizing Provider  acetaminophen (TYLENOL) 325 MG tablet Take 325 mg by mouth every 6 (six) hours as needed for mild pain or headache.   Yes [provider]  ARIPiprazole (ABILIFY) 15 MG tablet Take 1 tablet (15 mg total) by mouth daily. For mood control Patient not taking: Reported on 05/19/2018 10/01/17   Money, Gerlene Burdock, FNP  butalbital-acetaminophen-caffeine (FIORICET, ESGIC) [id number] MG tablet Take 1-2 tablets by mouth every 6 (six) hours as needed for headache. Patient not taking: Reported on 08/18/2018 05/19/18 05/19/19  Liberty Handy, PA-C  citalopram (CELEXA) 20 MG tablet Take 1 tablet (20 mg total) by mouth daily. For mood control Patient not taking: Reported on 05/19/2018 10/01/17   Money, Gerlene Burdock, FNP  gabapentin (NEURONTIN) 100 MG capsule Take 1 capsule (100 mg total) by mouth 3 (three) times daily. For withdrawal symptoms Patient not taking: Reported on 05/19/2018 09/30/17   Money, Gerlene Burdock, FNP  hydrOXYzine (ATARAX/VISTARIL) 25 MG tablet Take 1 tablet (25 mg total) by mouth 3 (three) times daily as needed for anxiety. Patient not taking: Reported on 05/19/2018 09/30/17   Money, Gerlene Burdock, FNP  traZODone (DESYREL) 50 MG tablet Take 1 tablet (50 mg total) by mouth at bedtime as needed for sleep. Patient not taking: Reported on 05/19/2018  09/30/17   Money, Gerlene Burdock, FNP    Family History Family History  Problem Relation Age of Onset  . Hypertension Mother   . Hypertension Father     Social History Social History   Tobacco Use  . Smoking status: Current Some Day Smoker    Types: Cigars  . Smokeless tobacco: Never Used  Substance Use Topics  . Alcohol use: Yes    Comment: Social  . Drug use: No    Comment: Hx of Marijuana      Allergies   Patient has no known allergies.   Review of Systems Review of Systems Ten systems reviewed and are negative for acute change, except as noted in the HPI.    Physical Exam Updated Vital Signs BP (!) 133/100 (BP  Location: Left Arm)   Pulse 71   Temp 98.5 F (36.9 C) (Oral)   Resp 14   Ht 5\' 5"  (1.651 m)   Wt 65.8 kg   LMP 08/13/2018   SpO2 100%   BMI 24.13 kg/m   Physical Exam  Constitutional: She is oriented to person, place, and time. She appears well-developed and well-nourished. No distress.  HENT:  Head: Normocephalic and atraumatic.  Eyes: Conjunctivae and EOM are normal. No scleral icterus.  Neck: Normal range of motion.  Pulmonary/Chest: Effort normal. No respiratory distress.  Musculoskeletal: Normal range of motion.  Neurological: She is alert and oriented to person, place, and time.  Skin: Skin is warm and dry. No rash noted. She is not diaphoretic. No erythema. No pallor.  Psychiatric: She has a normal mood and affect. Her behavior is normal. She expresses suicidal ideation. She expresses no homicidal ideation. She expresses no suicidal plans and no homicidal plans.  Intermittently tearful  Nursing note and vitals reviewed.    ED Treatments / Results  Labs (all labs ordered are listed, but only abnormal results are displayed) Labs Reviewed  COMPREHENSIVE METABOLIC PANEL - Abnormal; Notable for the following components:      Result Value   Potassium 3.2 (*)    Calcium 8.8 (*)    All other components within normal limits  ACETAMINOPHEN LEVEL - Abnormal; Notable for the following components:   Acetaminophen (Tylenol), Serum <10 (*)    All other components within normal limits  CBC - Abnormal; Notable for the following components:   RBC 3.83 (*)    Hemoglobin 9.7 (*)    HCT 31.6 (*)    MCH 25.3 (*)    RDW 16.4 (*)    All other components within normal limits  RAPID URINE DRUG SCREEN, HOSP PERFORMED - Abnormal; Notable for the following components:   Tetrahydrocannabinol POSITIVE (*)    All other components within normal limits  ETHANOL  SALICYLATE LEVEL  I-STAT BETA HCG BLOOD, ED ([facility], [facility], [facility] ONLY)    EKG None  Radiology No results  found.  Procedures Procedures (including critical care time)  Medications Ordered in ED Medications - No data to display   Initial Impression / Assessment and Plan / ED Course  I have reviewed the triage vital signs and the nursing notes.  Pertinent labs & imaging results that were available during my care of the patient were reviewed by me and considered in my medical decision making (see chart for details).     27 year old female presents for psychiatric evaluation.  She has been medically cleared and has been evaluated by TTS.  Pending psychiatric recommendations, though anticipate minimum of overnight observation.  Disposition to be  determined by oncoming ED provider.   Final Clinical Impressions(s) / ED Diagnoses   Final diagnoses:  Severe episode of recurrent major depressive disorder, without psychotic features [facility](HCC)    ED Discharge Orders    None       Antony MaduraHumes, Ninoshka Wainwright, PA-C 08/18/18 0529    Ward, Layla MawKristen N, DO 08/18/18 0559

## 2018-08-18 NOTE — Progress Notes (Signed)
Adult Psychoeducational Group Note  Date:  08/18/2018 Time:  8:39 PM  Group Topic/Focus:  Wrap-Up Group:   The focus of this group is to help patients review their daily goal of treatment and discuss progress on daily workbooks.  Participation Level:  Active  Participation Quality:  Appropriate  Affect:  Appropriate  Cognitive:  Alert  Insight: Appropriate  Engagement in Group:  Engaged  Modes of Intervention:  Discussion  Additional Comments:  Pt is a new admit to the unit. Her goal is to work on controlling things like anxiety and anger.   Flonnie HailstoneCOOKE, Marialy Urbanczyk R 08/18/2018, 8:39 PM

## 2018-08-18 NOTE — Plan of Care (Signed)
  Problem: Education: Goal: Knowledge of [facility] Education information/materials will improve Outcome: Progressing   

## 2018-08-18 NOTE — Progress Notes (Signed)
  D: Pt alert and oriented [facility]. Pt denies SI/HI, A/VH, and any pain. Pt is 27 y/o and presented to the ED with SI and no plan, and HI towards her family "which is not a good situation." Pt lives with her 3 children and her mother. Pt reports that she feels people attack her verbally. Pt stated she has tried to hurt herself in the past by taking Nyquil, "but it made me sick, and I also thought about hanging myself in the past." Pt reported being off her medication for 6 months.Pt also reports nightmares. Pt is pleasant and cooperative.    A: Education, support, reassurance, and encouragement provided, q15 minute safety checks initiated. Pt's belongings in locker # 4.  R: Pt denies any concerns at this time, and verbally contracts for safety. Pt ambulating on the unit with no issues. Pt remains safe on and off the unit.

## 2018-08-18 NOTE — ED Notes (Addendum)
Pt stated "I feel suicidal but I don't have a plan.  I'm living with my mother and it's not a good situation.  I have dreams and not sure if it's really happening.  I haven't taken meds in about 6 months.  I've been dx'd with depression and anxiety.  I feel like people are attacking me verbally.  Somebody is always saying something to me.  I keep waking up with migraines or random bruises.  I feel like people are kicking me.  I just don't know if it's really happening or not.  I have tried to hurt myself before.  I've taken Nyquil but it made me sick.  I've had thoughts of hanging myself before but I want something quick.  I have 3 children @ home."

## 2018-08-18 NOTE — BHH Counselor (Signed)
[facility] Selena BattenKim, RN, [facility] and assigned to room/bed: 505-2. Pt can come after 0900. Attending is Dr. Jeannine KittenFarah. Nursing report: [phone]. Voluntary paperwork is completed and [facility]. Updated disposition with Dr. Elesa MassedWard and Joanie CoddingtonLatricia, RN.   Nicole Pullingreylese D Teandra Harlan, MS, [facility], [facility] Triage Specialist [phone]

## 2018-08-19 DIAGNOSIS — G4700 Insomnia, unspecified: Secondary | ICD-10-CM | POA: Diagnosis not present

## 2018-08-19 DIAGNOSIS — F419 Anxiety disorder, unspecified: Secondary | ICD-10-CM | POA: Diagnosis not present

## 2018-08-19 DIAGNOSIS — F333 Major depressive disorder, recurrent, severe with psychotic symptoms: Secondary | ICD-10-CM | POA: Diagnosis not present

## 2018-08-19 MED ORDER — CLONAZEPAM 0.5 MG PO TABS
0.5000 mg | ORAL_TABLET | Freq: Every day | ORAL | Status: DC
  Administered 2018-08-19 – 2018-08-24 (×6): 0.5 mg via ORAL
  Filled 2018-08-19 (×6): qty 1

## 2018-08-19 NOTE — BHH Counselor (Signed)
Provided pt requested information on support services for LGBT community. Provided pt with LGBT support services.  Marian Sorrowherie Bohaboy, MSW Intern 08/19/18 12:30 PM
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
## 2018-08-19 NOTE — Plan of Care (Signed)
  Problem: Education: Goal: Emotional status will improve Outcome: Progressing   Problem: Safety: Goal: Periods of time without injury will increase Outcome: Progressing  DAR NOTE: Patient presents with calm affect and mood.  Denies suicidal thoughts, pain, auditory and visual hallucinations.  Patient is observed responding to internal stimuli.  Rates depression at 0, hopelessness at 0, and anxiety at 0.  Maintained on routine safety checks.  Medications given as prescribed.  Support and encouragement offered as needed.  Isolative to her room for majority of this shift.  Minimal interaction with staff and peers.  Patient is safe on and off the unit.  Offered no complaint.

## 2018-08-19 NOTE — B[facility] Group Notes (Signed)
[facility] Group Notes:  (Nursing/MHT/Case Management/Adjunct)  Date:  08/19/2018  Time:  9:13 PM  Type of Therapy:  Wrap up group  Participation Level:  Active  Participation Quality:  Appropriate  Affect:  Anxious  Cognitive:  Alert  Insight:  Appropriate  Engagement in Group:  Engaged  Modes of Intervention:  Discussion and Support  Summary of Progress/Problems:  Lurena JoinerKashia reported that her day was better than yesterday.  Her goal was to "rest better" but she did not accomplish this goal today but "it's getting there."  Levin BaconHeather V Kalle Bernath 08/19/2018, 9:13 PM
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
## 2018-08-20 DIAGNOSIS — F333 Major depressive disorder, recurrent, severe with psychotic symptoms: Secondary | ICD-10-CM | POA: Diagnosis not present

## 2018-08-20 DIAGNOSIS — G4700 Insomnia, unspecified: Secondary | ICD-10-CM | POA: Diagnosis not present

## 2018-08-20 DIAGNOSIS — F419 Anxiety disorder, unspecified: Secondary | ICD-10-CM | POA: Diagnosis not present

## 2018-08-20 MED ORDER — VORTIOXETINE HBR 10 MG PO TABS
10.0000 mg | ORAL_TABLET | Freq: Every day | ORAL | Status: DC
  Administered 2018-08-20 – 2018-08-25 (×6): 10 mg via ORAL
  Filled 2018-08-20 (×7): qty 1
  Filled 2018-08-20: qty 7
  Filled 2018-08-20: qty 1

## 2018-08-20 MED ORDER — PERPHENAZINE 4 MG PO TABS
4.0000 mg | ORAL_TABLET | Freq: Two times a day (BID) | ORAL | Status: DC
  Administered 2018-08-20 – 2018-08-22 (×5): 4 mg via ORAL
  Filled 2018-08-20 (×11): qty 1

## 2018-08-20 MED ORDER — PRENATAL MULTIVITAMIN CH
1.0000 | ORAL_TABLET | Freq: Every day | ORAL | Status: DC
  Administered 2018-08-21 – 2018-08-24 (×4): 1 via ORAL
  Filled 2018-08-20 (×3): qty 1
  Filled 2018-08-20: qty 7
  Filled 2018-08-20 (×4): qty 1

## 2018-08-20 NOTE — Progress Notes (Addendum)
D: Pt denies SI/HI/AVH. Pt is pleasant and cooperative. Pt presented very paranoid on the unit this evening, with minimal interaction, but would answer if engaged.   A: Pt was offered support and encouragement. Pt was given scheduled medications. Pt was encourage to attend groups. Q 15 minute checks were done for safety.   R:  safety maintained on unit.  Problem: Education: Goal: Mental status will improve Outcome: Progressing   Problem: Activity: Goal: Sleeping patterns will improve Outcome: Progressing

## 2018-08-20 NOTE — Progress Notes (Signed)
Recreation Therapy Notes  Date: 12.13.19 Time: 1000 Location: 500 Hall Dayroom  Group Topic: Movie  Goal Area(s) Addresses:  Patient will identify some of the struggles of the characters of the movie. Patient will identify some of the coping skills used by the characters in the movie.  Intervention: Therapeutic Movie  Activity: Otherhood. LRT played a movie for the patients that dealt with loneliness, acceptance, depression and loss of loved ones.  Patients were to identify how the characters in the movie coped with the issues they were dealing with and how those situations shaped the characters.  Education: Discharge Planning.   Education Outcome: Acknowledges education/In group clarification offered/Needs additional education.   Clinical Observations/Feedback:  Pt did not attend group.    Caroll RancherMarjette Denell Cothern, LRT/CTRS         Caroll RancherLindsay, Kaavya Puskarich A 08/20/2018 12:02 PM

## 2018-08-20 NOTE — Tx Team (Signed)
Interdisciplinary Treatment and Diagnostic Plan Update  08/20/2018 Time of Session: 9:00am Nicole [name] MRN: [id number]  Principal Diagnosis: <principal problem not specified>  Secondary Diagnoses: Active Problems:   Severe recurrent major depression w/psychotic features, mood-congruent (HCC)   Current Medications:  Current Facility-Administered Medications  Medication Dose Route Frequency Provider Last Rate Last Dose  . acetaminophen (TYLENOL) tablet 650 mg  650 mg Oral Q6H PRN Lindon Romp A, NP   650 mg at 08/20/18 1257  . alum & mag hydroxide-simeth (MAALOX/MYLANTA) 200-200-20 MG/5ML suspension 30 mL  30 mL Oral Q4H PRN Lindon Romp A, NP      . clonazePAM Bobbye Charleston) tablet 0.5 mg  0.5 mg Oral QHS Johnn Hai, MD   0.5 mg at 08/19/18 2250  . LORazepam (ATIVAN) tablet 1 mg  1 mg Oral Q4H PRN Johnn Hai, MD      . magnesium hydroxide (MILK OF MAGNESIA) suspension 30 mL  30 mL Oral Daily PRN Lindon Romp A, NP      . nicotine (NICODERM CQ - dosed in mg/24 hours) patch 21 mg  21 mg Transdermal Daily Johnn Hai, MD   21 mg at 08/20/18 8250  . perphenazine (TRILAFON) tablet 4 mg  4 mg Oral BID Johnn Hai, MD   4 mg at 08/20/18 0831  . prazosin (MINIPRESS) capsule 2 mg  2 mg Oral QHS Johnn Hai, MD   2 mg at 08/19/18 2250  . prenatal multivitamin tablet 1 tablet  1 tablet Oral Q1200 Johnn Hai, MD      . vortioxetine HBr (TRINTELLIX) tablet 10 mg  10 mg Oral Daily Johnn Hai, MD   10 mg at 08/20/18 1206   PTA Medications: Medications Prior to Admission  Medication Sig Dispense Refill Last Dose  . acetaminophen (TYLENOL) 325 MG tablet Take 325 mg by mouth every 6 (six) hours as needed for mild pain or headache.   Past Month at Unknown time  . ARIPiprazole (ABILIFY) 15 MG tablet Take 1 tablet (15 mg total) by mouth daily. For mood control (Patient not taking: Reported on 05/19/2018) 30 tablet 0 Not Taking at Unknown time  . butalbital-acetaminophen-caffeine (FIORICET, ESGIC)  50-325-40 MG tablet Take 1-2 tablets by mouth every 6 (six) hours as needed for headache. (Patient not taking: Reported on 08/18/2018) 20 tablet 0 Not Taking at Unknown time  . citalopram (CELEXA) 20 MG tablet Take 1 tablet (20 mg total) by mouth daily. For mood control (Patient not taking: Reported on 05/19/2018) 30 tablet 0 Not Taking at Unknown time  . gabapentin (NEURONTIN) 100 MG capsule Take 1 capsule (100 mg total) by mouth 3 (three) times daily. For withdrawal symptoms (Patient not taking: Reported on 05/19/2018) 90 capsule 0 Not Taking at Unknown time  . hydrOXYzine (ATARAX/VISTARIL) 25 MG tablet Take 1 tablet (25 mg total) by mouth 3 (three) times daily as needed for anxiety. (Patient not taking: Reported on 05/19/2018) 30 tablet 0 Not Taking at Unknown time  . traZODone (DESYREL) 50 MG tablet Take 1 tablet (50 mg total) by mouth at bedtime as needed for sleep. (Patient not taking: Reported on 05/19/2018) 30 tablet 0 Not Taking at Unknown time    Patient Stressors: Marital or family conflict Other: "Christmas, this time of year"  Patient Strengths: Ability for insight Motivation for treatment/growth  Treatment Modalities: Medication Management, Group therapy, Case management,  1 to 1 session with clinician, Psychoeducation, Recreational therapy.   Physician Treatment Plan for Primary Diagnosis: <principal problem not specified> Long Term Goal(s): Improvement  in symptoms so as ready for discharge Improvement in symptoms so as ready for discharge   Short Term Goals: Ability to identify changes in lifestyle to reduce recurrence of condition will improve Ability to verbalize feelings will improve Ability to verbalize feelings will improve Ability to disclose and discuss suicidal ideas Ability to demonstrate self-control will improve  Medication Management: Evaluate patient's response, side effects, and tolerance of medication regimen.  Therapeutic Interventions: 1 to 1 sessions, Unit  Group sessions and Medication administration.  Evaluation of Outcomes: Not Met  Physician Treatment Plan for Secondary Diagnosis: Active Problems:   Severe recurrent major depression w/psychotic features, mood-congruent ([place])  Long Term Goal(s): Improvement in symptoms so as ready for discharge Improvement in symptoms so as ready for discharge   Short Term Goals: Ability to identify changes in lifestyle to reduce recurrence of condition will improve Ability to verbalize feelings will improve Ability to verbalize feelings will improve Ability to disclose and discuss suicidal ideas Ability to demonstrate self-control will improve     Medication Management: Evaluate patient's response, side effects, and tolerance of medication regimen.  Therapeutic Interventions: 1 to 1 sessions, Unit Group sessions and Medication administration.  Evaluation of Outcomes: Not Met   RN Treatment Plan for Primary Diagnosis: <principal problem not specified> Long Term Goal(s): Knowledge of disease and therapeutic regimen to maintain health will improve  Short Term Goals: Ability to remain free from injury will improve, Ability to verbalize frustration and anger appropriately will improve, Ability to demonstrate self-control, Ability to verbalize feelings will improve, Ability to disclose and discuss suicidal ideas and Compliance with prescribed medications will improve  Medication Management: RN will administer medications as ordered by provider, will assess and evaluate patient's response and provide education to patient for prescribed medication. RN will report any adverse and/or side effects to prescribing provider.  Therapeutic Interventions: 1 on 1 counseling sessions, Psychoeducation, Medication administration, Evaluate responses to treatment, Monitor vital signs and CBGs as ordered, Perform/monitor CIWA, COWS, AIMS and Fall Risk screenings as ordered, Perform wound care treatments as  ordered.  Evaluation of Outcomes: Not Met   LCSW Treatment Plan for Primary Diagnosis: <principal problem not specified> Long Term Goal(s): Safe transition to appropriate next level of care at discharge, Engage patient in therapeutic group addressing interpersonal concerns.  Short Term Goals: Engage patient in aftercare planning with referrals and resources, Increase social support, Increase ability to appropriately verbalize feelings, Increase emotional regulation, Identify triggers associated with mental health/substance abuse issues and Increase skills for wellness and recovery  Therapeutic Interventions: Assess for all discharge needs, 1 to 1 time with Social worker, Explore available resources and support systems, Assess for adequacy in community support network, Educate family and significant other(s) on suicide prevention, Complete Psychosocial Assessment, Interpersonal group therapy.  Evaluation of Outcomes: Not Met   Progress in Treatment: Attending groups: Yes. Participating in groups: Yes. Taking medication as prescribed: Yes. Toleration medication: Yes. Family/Significant other contact made: Yes, individual(s) contacted:  grandfather  Patient understands diagnosis: Yes. Discussing patient identified problems/goals with staff: Yes. Medical problems stabilized or resolved: No. Denies suicidal/homicidal ideation: No. Issues/concerns per patient self-inventory: Yes.  New problem(s) identified: No, Describe:  CSW continuing to assess  New Short Term/Long Term Goal(s): medication management for mood stabilization; elimination of SI thoughts; development of comprehensive mental wellness/sobriety plan.  Patient Goals:  "Controlling my thoughts."  Discharge Plan or Barriers: [place] pamphlet, Mobile Crisis information, and AA/NA information provided to patient for additional community support and resources.  Reason for Continuation of Hospitalization: Anxiety Delusions   Depression Medication stabilization Suicidal ideation  Estimated Length of Stay: 3-5 days  Attendees: Patient: Nicole [name] 08/20/2018 2:46 PM  Physician: Dr.Farah 08/20/2018 2:46 PM  Nursing: Legrand Como 08/20/2018 2:46 PM  RN Care Manager: 08/20/2018 2:46 PM  Social Worker: Stephanie Acre, [place] 08/20/2018 2:46 PM  Recreational Therapist:  08/20/2018 2:46 PM  Other:  08/20/2018 2:46 PM  Other:  08/20/2018 2:46 PM  Other: 08/20/2018 2:46 PM    Scribe for Treatment Team: Joellen [place], [place] 08/20/2018 2:46 PM

## 2018-08-20 NOTE — [facility] Group Notes (Signed)
[facility]  08/20/2018 3:08 PM  Type of Therapy:  Group Therapy  Participation Level:  Active  Participation Quality:  Appropriate, Sharing and Supportive  Affect:  Appropriate  Cognitive:  Alert  Insight:  Engaged and Supportive  Engagement in Therapy:  Engaged and Supportive  Modes of Intervention:  Discussion and Support  Summary of Progress/Problems: Patient attended a spiritual care group with Chaplain regarding the meaning of and importance of "hope" in their life. Patient defined hope as "being accepted" and shared "it's getting there, one day at a time."  Nicole McleanCharlotte C Hazley [name] 08/20/2018, 3:08 PM
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
## 2018-08-21 DIAGNOSIS — F332 Major depressive disorder, recurrent severe without psychotic features: Secondary | ICD-10-CM

## 2018-08-21 DIAGNOSIS — G4700 Insomnia, unspecified: Secondary | ICD-10-CM | POA: Diagnosis not present

## 2018-08-21 MED ORDER — TRAZODONE HCL 50 MG PO TABS
50.0000 mg | ORAL_TABLET | Freq: Every evening | ORAL | Status: DC | PRN
  Administered 2018-08-21 – 2018-08-22 (×2): 50 mg via ORAL
  Filled 2018-08-21 (×6): qty 1

## 2018-08-21 NOTE — Plan of Care (Signed)
  Problem: Coping: Goal: Ability to verbalize frustrations and anger appropriately will improve Outcome: Progressing Goal: Ability to demonstrate self-control will improve Outcome: Progressing   D: Pt alert and oriented on the unit. Pt engaging with RN staff and other pts. Pt denies SI/HI, A/VH, and any pain. Pt slept most of the day and isolated in her room. Pt is pleasant and cooperative. A: Education, support and encouragement provided, q15 minute safety checks remain in effect. Medications administered per MD orders. R: No reactions/side effects to medicine noted. Pt denies any concerns at this time, and verbally contracts for safety. Pt ambulating on the unit with no issues. Pt remains safe on and off the unit.
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
## 2018-08-21 NOTE — Plan of Care (Signed)
D: Pt denies SI/HI/VH , +ve AH- better. Pt is pleasant and cooperative. Pt stated she was feeling better overall" getting there slowly" . Pt visible in the dayroom some of the evening A: Pt was offered support and encouragement. Pt was given scheduled medications. Pt was encourage to attend groups. Q 15 minute checks were done for safety.  R:Pt attends groups and interacts well with peers and staff. Pt is taking medication. Pt has no complaints.Pt receptive to treatment and safety maintained on unit.  Problem: Education: Goal: Mental status will improve Outcome: Progressing   Problem: Activity: Goal: Interest or engagement in activities will improve Outcome: Progressing   Problem: Activity: Goal: Sleeping patterns will improve Outcome: Progressing

## 2018-08-22 MED ORDER — PERPHENAZINE 4 MG PO TABS: 6.0000 mg | ORAL_TABLET | Freq: Two times a day (BID) | ORAL | Status: DC

## 2018-08-22 MED ORDER — PERPHENAZINE 4 MG PO TABS
6.0000 mg | ORAL_TABLET | Freq: Two times a day (BID) | ORAL | Status: DC
  Administered 2018-08-22 – 2018-08-25 (×6): 6 mg via ORAL
  Filled 2018-08-22 (×10): qty 1

## 2018-08-22 NOTE — Progress Notes (Signed)
D.  Pt cautious on approach, appears guarded and somewhat paranoid.  Pt forwards little.  Pt is compliant with medication regimen.  Pt denies SI/HI/ but is experiencing auditory hallucinations at times.  Pt did not attend evening wrap up group, stayed in room.  A.  Support and encouragement offered.  Medication given as ordered  R.  Pt remains safe on the unit, will continue to monitor.

## 2018-08-22 NOTE — [facility] Group Notes (Signed)
[facility] Group Notes:  (Nursing)  Date:  08/22/2018  Time:100 Type of Therapy:  Nurse Education  Participation Level:  Did Not Attend   Shela NevinValerie S Leidi Astle 08/22/2018, 2:33 PM
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
## 2018-08-22 NOTE — Plan of Care (Signed)
  Problem: Education: Goal: Emotional status will improve Outcome: Progressing Goal: Mental status will improve Outcome: Progressing     D: Pt alert and oriented on the unit. Pt isolated in her room for most of the day reading and coloring on her bed. Pt denies SI/HI, VH, but still endorses AH. Pt is pleasant and cooperative. A: Education, support and encouragement provided, q15 minute safety checks remain in effect. Medications administered per MD orders. R: No reactions/side effects to medicine noted. Pt denies any concerns at this time, and verbally contracts for safety. Pt ambulating on the unit with no issues. Pt remains safe on and off the unit.

## 2018-08-22 NOTE — Progress Notes (Signed)
Patient did not attend the evening speaker AA meeting. Pt was invited to attend group although she moved from the 500 hall. Pt remained in bed during group time.

## 2018-08-23 DIAGNOSIS — F333 Major depressive disorder, recurrent, severe with psychotic symptoms: Secondary | ICD-10-CM | POA: Diagnosis not present

## 2018-08-23 DIAGNOSIS — F419 Anxiety disorder, unspecified: Secondary | ICD-10-CM | POA: Diagnosis not present

## 2018-08-23 DIAGNOSIS — G4700 Insomnia, unspecified: Secondary | ICD-10-CM | POA: Diagnosis not present

## 2018-08-23 MED ORDER — CYPROHEPTADINE HCL 4 MG PO TABS
4.0000 mg | ORAL_TABLET | Freq: Every day | ORAL | Status: DC
  Administered 2018-08-23 – 2018-08-24 (×2): 4 mg via ORAL
  Filled 2018-08-23 (×3): qty 1
  Filled 2018-08-23: qty 7

## 2018-08-23 NOTE — Progress Notes (Signed)
Recreation Therapy Notes  Date: 12.16.19 Time: 0930 Location: [address] Day[address]  Group Topic: Stress Management  Goal Area(s) Addresses:  Patient will verbalize importance of using healthy stress management.  Patient will identify positive emotions associated with healthy stress management.   Intervention: Stress Management  Activity :  Meditation.  LRT introduced the stress management technique of meditation.  LRT played a meditation that focused on getting your mind prepared to face the day.  Patients were to listen and follow along as meditation played.  Education:  Stress Management, Discharge Planning.   Education Outcome: Acknowledges edcuation/In group clarification offered/Needs additional education  Clinical Observations/Feedback: Pt did not attend group.     Nicole [name], LRT/CTRS         Meghana Tullo A 08/23/2018 11:32 AM 

## 2018-08-23 NOTE — Progress Notes (Signed)
Patient denies SI, HI and AVH this shift.  Patient has been isolative to her room this shift and remains paranoid and guarded. Patient has has no incidents of behavorial dyscontrol.  Patient is compliant with medications, but does not participate in groups or unit activities.   Assess patient for safety, offer medications as prescribed, engage patient in 1:1 staff talks.   Patient able to contract for safety, continue to monitor as planned.

## 2018-08-23 NOTE — Progress Notes (Signed)
Adult Psychoeducational Group Note  Date:  08/23/2018 Time:  10:31 PM  Group Topic/Focus:  Wrap-Up Group:   The focus of this group is to help patients review their daily goal of treatment and discuss progress on daily workbooks.  Participation Level:  Active  Participation Quality:  Appropriate  Affect:  Appropriate  Cognitive:  Appropriate  Insight: Appropriate  Engagement in Group:  Engaged  Modes of Intervention:  Discussion  Additional Comments:  Pt stated goal was to get out of bed, mover around more and engage with others.  Pt stated she met her goal.  Pt rated the day at a 7/10.  Kinte Trim 08/23/2018, 10:31 PM
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
## 2018-08-24 DIAGNOSIS — G4700 Insomnia, unspecified: Secondary | ICD-10-CM | POA: Diagnosis not present

## 2018-08-24 DIAGNOSIS — F333 Major depressive disorder, recurrent, severe with psychotic symptoms: Secondary | ICD-10-CM | POA: Diagnosis not present

## 2018-08-24 DIAGNOSIS — F419 Anxiety disorder, unspecified: Secondary | ICD-10-CM | POA: Diagnosis not present
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
## 2018-08-24 NOTE — Plan of Care (Addendum)
Patient presented to the medication window with a flat/depressed affect. Patient denied SI and HI, denied AH, but endorsed VH with "shadows."   Patient is compliant with medications. Denies side effects. Patient is safe on the unit. 15 minute checks are in place. Will continue to monitor and provide support.  Problem: Coping: Goal: Ability to demonstrate self-control will improve Outcome: Progressing   Problem: Education: Goal: Emotional status will improve Outcome: Not Progressing Goal: Mental status will improve Outcome: Not Progressing   Problem: Activity: Goal: Interest or engagement in activities will improve Outcome: Not Progressing

## 2018-08-24 NOTE — Progress Notes (Signed)
Recreation Therapy Notes  Animal-Assisted Activity (AAA) Program Checklist/Progress Notes Patient Eligibility Criteria Checklist & Daily Group note for Rec Tx Intervention  Date: 12.17.19 Time: 1430 [address]   AAA/T Program Assumption of Risk Form signed by Engineer, productionatient/ or Parent Legal Guardian  YES  Patient is free of allergies or sever asthma  YES   Patient reports no fear of animals  YES   Patient reports no history of cruelty to animals YES   Patient understands his/her participation is voluntary  YES   Patient washes hands before animal contact YES  Patient washes hands after animal contact  YES   Education: Charity fundraiserHand Washing, Appropriate Animal Interaction   Education Outcome: Acknowledges understanding/In group clarification offered/Needs additional education.   Clinical Observations/Feedback: Pt did not group activity.    Nicole [name], Nicole [name]         Nicole [name], Nicole [name] 08/24/2018 3:08 PM

## 2018-08-24 NOTE — [facility] Group Notes (Signed)
[facility] 08/24/2018 1:15pm  Type of Therapy: [facility] Presentation  Participation Level: Pt invited. Chose to remain in bed.   Rona RavensHeather S Amron Guerrette, LCSW 08/24/2018 2:25 PM

## 2018-08-24 NOTE — [facility] Group Notes (Signed)
Adult Psychoeducational Group Note  Date:  08/24/2018 Time:  9:12 AM  Group Topic/Focus:  Orientation:   The focus of this group is to educate the patient on the purpose and policies of crisis stabilization and provide a format to answer questions about their admission.  The group details unit policies and expectations of patients while admitted.  Participation Level:  Active  Participation Quality:  Appropriate  Affect:  Appropriate  Cognitive:  Alert  Insight: Appropriate  Engagement in Group:  Engaged  Modes of Intervention:  Orientation  Additional Comments:  Pt attended orientation/goals group facilitated by [facility] Lequisha C. Pt goal for today is to find coping skills for depression.   Nicole [name] 08/24/2018, 9:12 AM

## 2018-08-24 NOTE — Progress Notes (Signed)
[facility] MD Progress Note  08/24/2018 9:19 AM Nicole [name]  MRN:  [id number] Subjective:    Patient seen in her room she reports no nightmares last night and less focused on the delusional believes.  Overall is progressing case discussed in team probably 1-2 more days may be ready for discharge but she denies wanting to harm her self today contract here Principal Problem: Severe recurrent major depression w/psychotic features, mood-congruent (HCC) Diagnosis: Principal Problem:   Severe recurrent major depression w/psychotic features, mood-congruent (HCC)  Total Time spent with patient: 20 minutes   Past Medical History:  Past Medical History:  Diagnosis Date  . Major depression   . Pregnancy induced hypertension 2011    Past Surgical History:  Procedure Laterality Date  . CESAREAN SECTION  10/14/2011   Procedure: CESAREAN SECTION;  Surgeon: Kathreen Cosier, MD;  Location: [facility];  Service: Gynecology;  Laterality: N/A;  Primary Cesarean Section Delivery Baby Boy @ 0001, Apgars 8/9  . WISDOM TOOTH EXTRACTION  2010   Family History:  Family History  Problem Relation Age of Onset  . Hypertension Mother   . Hypertension Father   Social History:  Social History   Substance and Sexual Activity  Alcohol Use Yes   Comment: Social     Social History   Substance and Sexual Activity  Drug Use No   Comment: Hx of Marijuana     Social History   Socioeconomic History  . Marital status: Single    Spouse name: Not on file  . Number of children: Not on file  . Years of education: Not on file  . Highest education level: Not on file  Occupational History  . Not on file  Social Needs  . Financial resource strain: Not on file  . Food insecurity:    Worry: Not on file    Inability: Not on file  . Transportation needs:    Medical: Not on file    Non-medical: Not on file  Tobacco Use  . Smoking status: Current Some Day Smoker    Types: Cigars  . Smokeless tobacco: Never Used   Substance and Sexual Activity  . Alcohol use: Yes    Comment: Social  . Drug use: No    Comment: Hx of Marijuana   . Sexual activity: Not Currently    Birth control/protection: None  Lifestyle  . Physical activity:    Days per week: Not on file    Minutes per session: Not on file  . Stress: Not on file  Relationships  . Social connections:    Talks on phone: Not on file    Gets together: Not on file    Attends religious service: Not on file    Active member of club or organization: Not on file    Attends meetings of clubs or organizations: Not on file    Relationship status: Not on file  Other Topics Concern  . Not on file  Social History Narrative  . Not on file   Additional Social History:                         Sleep: Good  Appetite:  Good  Current Medications: Current Facility-Administered Medications  Medication Dose Route Frequency Provider Last Rate Last Dose  . acetaminophen (TYLENOL) tablet 650 mg  650 mg Oral Q6H PRN Jackelyn Poling, NP   650 mg at 08/23/18 2114  . alum & mag hydroxide-simeth (MAALOX/MYLANTA) 200-200-20 MG/5ML suspension  30 mL  30 mL Oral Q4H PRN Nira Conn A, NP      . clonazePAM Scarlette Calico) tablet 0.5 mg  0.5 mg Oral QHS Malvin Johns, MD   0.5 mg at 08/23/18 2114  . cyproheptadine (PERIACTIN) 4 MG tablet 4 mg  4 mg Oral QHS Malvin Johns, MD   4 mg at 08/23/18 2114  . LORazepam (ATIVAN) tablet 1 mg  1 mg Oral Q4H PRN Malvin Johns, MD      . magnesium hydroxide (MILK OF MAGNESIA) suspension 30 mL  30 mL Oral Daily PRN Nira Conn A, NP      . nicotine (NICODERM CQ - dosed in mg/24 hours) patch 21 mg  21 mg Transdermal Daily Malvin Johns, MD   21 mg at 08/24/18 0745  . perphenazine (TRILAFON) tablet 6 mg  6 mg Oral BID Oneta Rack, NP   6 mg at 08/24/18 0745  . prazosin (MINIPRESS) capsule 2 mg  2 mg Oral QHS Malvin Johns, MD   2 mg at 08/23/18 2114  . prenatal multivitamin tablet 1 tablet  1 tablet Oral Q1200 Malvin Johns, MD    1 tablet at 08/23/18 1817  . vortioxetine HBr (TRINTELLIX) tablet 10 mg  10 mg Oral Daily Malvin Johns, MD   10 mg at 08/24/18 0745    Lab Results: No results found for this or any previous visit (from the past 48 hour(s)).  Blood Alcohol level:  Lab Results  Component Value Date   ETH <10 08/18/2018   ETH <10 09/20/2017    Metabolic Disorder Labs: Lab Results  Component Value Date   HGBA1C 5.4 09/21/2017   MPG 108.28 09/21/2017   No results found for: PROLACTIN Lab Results  Component Value Date   CHOL 147 09/21/2017   TRIG 41 09/21/2017   HDL 45 09/21/2017   CHOLHDL 3.3 09/21/2017   VLDL 8 09/21/2017   LDLCALC 94 09/21/2017    Physical Findings: AIMS: Facial and Oral Movements Muscles of Facial Expression: None, normal Lips and Perioral Area: None, normal Jaw: None, normal Tongue: None, normal,Extremity Movements Upper (arms, wrists, hands, fingers): None, normal Lower (legs, knees, ankles, toes): None, normal, Trunk Movements Neck, shoulders, hips: None, normal, Overall Severity Severity of abnormal movements (highest score from questions above): None, normal Incapacitation due to abnormal movements: None, normal Patient's awareness of abnormal movements (rate only patient's report): No Awareness, Dental Status Current problems with teeth and/or dentures?: No Does patient usually wear dentures?: No  CIWA:    COWS:     Musculoskeletal: Strength & Muscle Tone: within normal limits Gait & Station: normal Patient leans: N/A  Psychiatric Specialty Exam: Physical Exam  ROS  Blood pressure 108/77, pulse 82, temperature 98.8 F (37.1 C), temperature source Oral, resp. rate 20, height 5\' 5"  (1.651 m), weight 65.8 kg, last menstrual period 08/13/2018, SpO2 100 %, unknown if currently breastfeeding.Body mass index is 24.13 kg/m.  General Appearance: Casual  Eye Contact:  Good  Speech:  Clear and Coherent  Volume:  Decreased  Mood:  Dysphoric  Affect:  Blunt   Thought Process:  Goal Directed  Orientation:  Full (Time, Place, and Person)  Thought Content:  Delusions  Suicidal Thoughts:  No  Homicidal Thoughts:  No  Memory:  Immediate;   Fair  Judgement:  Fair  Insight:  Fair  Psychomotor Activity:  Decreased  Concentration:  Concentration: Good  Recall:  Good  Fund of Knowledge:  Fair  Language:  Fair  Akathisia:  Negative  Handed:  Right  AIMS (if indicated):     Assets:  Physical Health  ADL's:  Intact  Cognition:  WNL  Sleep:  Number of Hours: 6   For night terrors/nightmares continue prazosin and clonazepam, for delusional believes continue antipsychotics, Mood disorder continue cognitive-based therapy and meds   Treatment Plan Summary: Daily contact with patient to assess and evaluate symptoms and progress in treatment and Medication management  Malvin JohnsFARAH,Ashlynd Michna, MD 08/24/2018, 9:19 AM

## 2018-08-25 ENCOUNTER — Other Ambulatory Visit: Payer: Self-pay

## 2018-08-25 ENCOUNTER — Ambulatory Visit (HOSPITAL_COMMUNITY)
Admission: AD | Admit: 2018-08-25 | Discharge: 2018-08-25 | Disposition: A | Discharge status: Home / Self Care | Payer: Medicaid Other | Attending: Psychiatry | Admitting: Psychiatry

## 2018-08-25 ENCOUNTER — Encounter (HOSPITAL_COMMUNITY): Payer: Self-pay | Admitting: Emergency Medicine

## 2018-08-25 DIAGNOSIS — R45850 Homicidal ideations: Secondary | ICD-10-CM | POA: Insufficient documentation

## 2018-08-25 DIAGNOSIS — F17210 Nicotine dependence, cigarettes, uncomplicated: Secondary | ICD-10-CM | POA: Insufficient documentation

## 2018-08-25 DIAGNOSIS — Z79899 Other long term (current) drug therapy: Secondary | ICD-10-CM | POA: Insufficient documentation

## 2018-08-25 DIAGNOSIS — F419 Anxiety disorder, unspecified: Secondary | ICD-10-CM | POA: Diagnosis not present

## 2018-08-25 DIAGNOSIS — Z1330 Encounter for screening examination for mental health and behavioral disorders, unspecified: Secondary | ICD-10-CM | POA: Diagnosis present

## 2018-08-25 DIAGNOSIS — F333 Major depressive disorder, recurrent, severe with psychotic symptoms: Secondary | ICD-10-CM | POA: Diagnosis not present

## 2018-08-25 DIAGNOSIS — R440 Auditory hallucinations: Secondary | ICD-10-CM | POA: Insufficient documentation

## 2018-08-25 DIAGNOSIS — G4700 Insomnia, unspecified: Secondary | ICD-10-CM | POA: Diagnosis not present

## 2018-08-25 DIAGNOSIS — F332 Major depressive disorder, recurrent severe without psychotic features: Secondary | ICD-10-CM | POA: Insufficient documentation

## 2018-08-25 MED ORDER — VORTIOXETINE HBR 10 MG PO TABS: 10.0000 mg | ORAL_TABLET | Freq: Every day | ORAL | 1 refills | Status: AC

## 2018-08-25 MED ORDER — CYPROHEPTADINE HCL 4 MG PO TABS: 4.0000 mg | ORAL_TABLET | Freq: Every day | ORAL | 1 refills | Status: AC

## 2018-08-25 MED ORDER — PRENATAL MULTIVITAMIN CH: 1.0000 | ORAL_TABLET | Freq: Every day | ORAL | 1 refills | Status: AC

## 2018-08-25 MED ORDER — PERPHENAZINE 2 MG PO TABS
2.0000 mg | ORAL_TABLET | Freq: Every day | ORAL | Status: DC
  Filled 2018-08-25 (×3): qty 28

## 2018-08-25 MED ORDER — PERPHENAZINE 2 MG PO TABS: ORAL_TABLET | ORAL | 1 refills | Status: AC

## 2018-08-25 MED ORDER — PRAZOSIN HCL 2 MG PO CAPS: 2.0000 mg | ORAL_CAPSULE | Freq: Every day | ORAL | 2 refills | Status: AC
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
## 2018-08-25 NOTE — Progress Notes (Signed)
Discharge note: Patient reviewed discharge paperwork with RN including prescriptions, follow up appointments, and lab work. Patient given the opportunity to ask questions. All concerns were addressed. All belongings were returned to patient. Denied SI/HI/AVH. Patient thanked staff for their care while at the hospital.  Patient was discharged to lobby with a bus pass. 

## 2018-08-25 NOTE — Progress Notes (Signed)
  [facility] :  Will you be returning to the same living situation after discharge:  No. Pt states that she will stay with her mother at discharge (she would not give consent for us to contact her mother).  At discharge, do you have transportation home?: Yes,  bus pass in chart per pt request. Do you have the ability to pay for your medications: Yes,  [facility]  Release of information consent forms completed and submitted to medical records by CSW.   Patient to Follow up at: Follow-up Information    [facility]. Go on 08/26/2018.   Specialty:  Behavioral Health Why:  Your next hospital follow up appointment is Thursday, 08/26/18 at 10:00a.  Please bring: photo ID, proof of insurance, social security card, and any discharge paperwork from this hospitalization. Contact information: [address] [place] [address] [phone]           Next level of care provider has access to [facility] Link:no  Safety Planning and Suicide Prevention discussed: Yes,  SPE completed with pt; pt declined to consent to collateral contact. SPI pamphlet and mobile crisis information provided.    Has patient been referred to the Quitline?: Patient refused referral  Patient has been referred for addiction treatment: Yes  Rona RavensHeather S Can Lucci, LCSW 08/25/2018, 9:58 AM

## 2018-08-25 NOTE — Progress Notes (Signed)
D   Pt endorses some depression and anxiety    Pt complained of headache and has mostly isolated to her room and has limited interaction with others A   Verbal support given   Medications administered and effectiveness monitored   Q 15 min checks  R   Pt is safe at this time

## 2018-08-25 NOTE — Plan of Care (Signed)
  Problem: Education: Goal: Knowledge of [facility] Education information/materials will improve Outcome: Adequate for Discharge   Problem: Education: Goal: Emotional status will improve Outcome: Adequate for Discharge   Problem: Education: Goal: Mental status will improve Outcome: Adequate for Discharge   Problem: Education: Goal: Verbalization of understanding the information provided will improve Outcome: Adequate for Discharge   Problem: Activity: Goal: Interest or engagement in activities will improve Outcome: Adequate for Discharge   

## 2018-08-25 NOTE — BHH Suicide Risk Assessment (Signed)
[facility]   Principal Problem: Severe recurrent major depression w/psychotic features, mood-congruent [facility](HCC) Discharge Diagnoses: Principal Problem:   Severe recurrent major depression w/psychotic features, mood-congruent (HCC)   Total Time spent with patient: 30 minutes   Mental Status Per Nursing Assessment::   On Admission:  NA(pt denies SI)  Demographic Factors:  Low socioeconomic status  Loss Factors: Decrease in vocational status  Historical Factors: NA  Risk Reduction Factors:   Religious beliefs about death  Continued Clinical Symptoms:  Schizophrenia:   Paranoid or undifferentiated type  Cognitive Features That Contribute To Risk:  Closed-mindedness    Suicide Risk:  Minimal: No identifiable suicidal ideation.  Patients presenting with no risk factors but with morbid ruminations; may be classified as minimal risk based on the severity of the depressive symptoms  Follow-up Information    [facility]. Go on 08/26/2018.   Specialty:  Behavioral Health Why:  Your next hospital follow up appointment is Thursday, 08/26/18 at 10:00a.  Please bring: photo ID, proof of insurance, social security card, and any discharge paperwork from this hospitalization. Contact information: [address] [place] [address] [phone]           Plan Of Care/Follow-up recommendations:  Activity:  full  Quaneisha Hanisch, MD 08/25/2018, 10:01 AM

## 2018-08-25 NOTE — H&P (Signed)
Behavioral Health Medical Screening Exam  Nicole [name] is an 27 y.o. [facility]. [facility] at 2:00 PM. Pt reports her family "started arguing with me" and nor reports SI and HI towards family. Pt reports AH.Patient denies any physical aliments and or complaints.  Total Time spent with patient: 20 minutes  Psychiatric Specialty Exam: Physical Exam  Constitutional: She is oriented to person, place, and time. She appears well-developed and well-nourished. No distress.  HENT:  Head: Normocephalic.  Eyes: Pupils are equal, round, and reactive to light.  Respiratory: Effort normal and breath sounds normal. No respiratory distress.  Neurological: She is alert and oriented to person, place, and time. No cranial nerve deficit.  Skin: She is not diaphoretic.    Review of Systems  Psychiatric/Behavioral: Positive for depression and suicidal ideas.  All other systems reviewed and are negative.   Last menstrual period 08/13/2018, unknown if currently breastfeeding.There is no height or weight on file to calculate BMI.  General Appearance: Casual  Eye Contact:  Fair  Speech:  Clear and Coherent  Volume:  Normal  Mood:  Depressed  Affect:  Congruent  Thought Process:  Disorganized  Orientation:  Full (Time, Place, and Person)  Thought Content:  Rumination  Suicidal Thoughts:  Yes.  without intent/plan  Homicidal Thoughts:  Yes.  without intent/plan  Memory:  Immediate;   Fair  Judgement:  Poor  Insight:  Lacking  Psychomotor Activity:  Normal  Concentration: Concentration: Fair  Recall:  Fis[employer]Fair  Fund of Knowledge:Fair  Language: Fair  Akathisia:  No  Handed:  Right  AIMS (if indicated):     Assets:  Desire for Improvement  Sleep:       Musculoskeletal: Strength & Muscle Tone: within normal limits Gait & Station: normal Patient leans: N/A  Last menstrual period 08/13/2018, unknown if currently  breastfeeding.  Recommendations:  Based on my evaluation the patient does not appear to have an emergency medical condition.  Kerry HoughSpencer E Dannia Snook, PA-C 08/25/2018, 10:09 PM

## 2018-08-25 NOTE — BHH Suicide Risk Assessment (Signed)
B[facility] INPATIENT:  Family/Significant Other Suicide Prevention Education  Suicide Prevention Education:  Patient Refusal for Family/Significant Other Suicide Prevention Education: The patient Nicole [name] has refused to provide written consent for family/significant other to be provided Family/Significant Other Suicide Prevention Education during admission and/or prior to discharge.  Physician notified.  SPE completed with pt, as pt refused to consent to family contact. SPI pamphlet provided to pt and pt was encouraged to share information with support network, ask questions, and talk about any concerns relating to SPE. Pt denies access to guns/firearms and verbalized understanding of information provided. Mobile Crisis information also provided to pt.   Rona RavensHeather S Zenith Kercheval LCSW 08/25/2018, 9:58 AM

## 2018-08-25 NOTE — Progress Notes (Signed)
Adult Psychoeducational Group Note  Date:  08/25/2018 Time:  2:37 AM  Group Topic/Focus:  Wrap-Up Group:   The focus of this group is to help patients review their daily goal of treatment and discuss progress on daily workbooks.  Participation Level:  Did Not Attend  Participation Quality:   Affect:   Cognitive:   Insight:   Engagement in Group:   Modes of Intervention:   Additional Comments:  Pt was invited to attend but declined.  Venessa Wickham 08/25/2018, 2:37 AM

## 2018-08-25 NOTE — ED Triage Notes (Signed)
Patient states she is having hallucinations and the voices are telling her to kill people.

## 2018-08-25 NOTE — Progress Notes (Signed)
Recreation Therapy Notes  Date: 12.18.19 Time: 0930 Location: [address] Dayroom  Group Topic: Stress Management  Goal Area(s) Addresses:  Patient will verbalize importance of using healthy stress management.  Patient will identify positive emotions associated with healthy stress management.   Intervention: Stress Management  Activity : Progressive Muscle Relaxation.  LRT introduced the stress management technique of progressive muscle relaxation.  LRT read a script guiding patients through the process of tensing and releasing each muscle one at a time.  Education:  Stress Management, Discharge Planning.   Education Outcome: Acknowledges edcuation/In group clarification offered/Needs additional education  Clinical Observations/Feedback: Pt did not attend group .    Caroll RancherMarjette Melanee Cordial, LRT/CTRS         Caroll RancherLindsay, Fausto Sampedro A 08/25/2018 11:54 AM

## 2018-08-25 NOTE — Tx Team (Signed)
Interdisciplinary Treatment and Diagnostic Plan Update  08/25/2018 Time of Session: 9:00am Nicole [name] MRN: [id number]  Principal Diagnosis: Severe recurrent major depression w/psychotic features, mood-congruent (HCC)  Secondary Diagnoses: Principal Problem:   Severe recurrent major depression w/psychotic features, mood-congruent (HCC)   Current Medications:  Current Facility-Administered Medications  Medication Dose Route Frequency Provider Last Rate Last Dose  . acetaminophen (TYLENOL) tablet 650 mg  650 mg Oral Q6H PRN Jackelyn Poling, NP   650 mg at 08/24/18 2002  . alum & mag hydroxide-simeth (MAALOX/MYLANTA) 200-200-20 MG/5ML suspension 30 mL  30 mL Oral Q4H PRN Nira Conn A, NP      . clonazePAM Scarlette Calico) tablet 0.5 mg  0.5 mg Oral QHS Malvin Johns, MD   0.5 mg at 08/24/18 2303  . cyproheptadine (PERIACTIN) 4 MG tablet 4 mg  4 mg Oral QHS Malvin Johns, MD   4 mg at 08/24/18 2304  . LORazepam (ATIVAN) tablet 1 mg  1 mg Oral Q4H PRN Malvin Johns, MD      . magnesium hydroxide (MILK OF MAGNESIA) suspension 30 mL  30 mL Oral Daily PRN Nira Conn A, NP      . nicotine (NICODERM CQ - dosed in mg/24 hours) patch 21 mg  21 mg Transdermal Daily Malvin Johns, MD   21 mg at 08/25/18 0754  . perphenazine (TRILAFON) tablet 6 mg  6 mg Oral BID Oneta Rack, NP   6 mg at 08/25/18 0754  . prazosin (MINIPRESS) capsule 2 mg  2 mg Oral QHS Malvin Johns, MD   2 mg at 08/24/18 2303  . prenatal multivitamin tablet 1 tablet  1 tablet Oral Q1200 Malvin Johns, MD   1 tablet at 08/24/18 1114  . vortioxetine HBr (TRINTELLIX) tablet 10 mg  10 mg Oral Daily Malvin Johns, MD   10 mg at 08/25/18 0754   PTA Medications: Medications Prior to Admission  Medication Sig Dispense Refill Last Dose  . acetaminophen (TYLENOL) 325 MG tablet Take 325 mg by mouth every 6 (six) hours as needed for mild pain or headache.   Past Month at Unknown time  . ARIPiprazole (ABILIFY) 15 MG tablet Take 1 tablet (15 mg total)  by mouth daily. For mood control (Patient not taking: Reported on 05/19/2018) 30 tablet 0 Not Taking at Unknown time  . butalbital-acetaminophen-caffeine (FIORICET, ESGIC) 50-325-40 MG tablet Take 1-2 tablets by mouth every 6 (six) hours as needed for headache. (Patient not taking: Reported on 08/18/2018) 20 tablet 0 Not Taking at Unknown time  . citalopram (CELEXA) 20 MG tablet Take 1 tablet (20 mg total) by mouth daily. For mood control (Patient not taking: Reported on 05/19/2018) 30 tablet 0 Not Taking at Unknown time  . gabapentin (NEURONTIN) 100 MG capsule Take 1 capsule (100 mg total) by mouth 3 (three) times daily. For withdrawal symptoms (Patient not taking: Reported on 05/19/2018) 90 capsule 0 Not Taking at Unknown time  . hydrOXYzine (ATARAX/VISTARIL) 25 MG tablet Take 1 tablet (25 mg total) by mouth 3 (three) times daily as needed for anxiety. (Patient not taking: Reported on 05/19/2018) 30 tablet 0 Not Taking at Unknown time  . traZODone (DESYREL) 50 MG tablet Take 1 tablet (50 mg total) by mouth at bedtime as needed for sleep. (Patient not taking: Reported on 05/19/2018) 30 tablet 0 Not Taking at Unknown time    Patient Stressors: Marital or family conflict Other: "Christmas, this time of year"  Patient Strengths: Ability for insight Motivation for treatment/growth  Treatment Modalities:  Medication Management, Group therapy, Case management,  1 to 1 session with clinician, Psychoeducation, Recreational therapy.   Physician Treatment Plan for Primary Diagnosis: Severe recurrent major depression w/psychotic features, mood-congruent (HCC) Long Term Goal(s): Improvement in symptoms so as ready for discharge Improvement in symptoms so as ready for discharge   Short Term Goals: Ability to identify changes in lifestyle to reduce recurrence of condition will improve Ability to verbalize feelings will improve Ability to verbalize feelings will improve Ability to disclose and discuss suicidal  ideas Ability to demonstrate self-control will improve  Medication Management: Evaluate patient's response, side effects, and tolerance of medication regimen.  Therapeutic Interventions: 1 to 1 sessions, Unit Group sessions and Medication administration.  Evaluation of Outcomes: Adequate for discharge  Physician Treatment Plan for Secondary Diagnosis: Principal Problem:   Severe recurrent major depression w/psychotic features, mood-congruent (HCC)  Long Term Goal(s): Improvement in symptoms so as ready for discharge Improvement in symptoms so as ready for discharge   Short Term Goals: Ability to identify changes in lifestyle to reduce recurrence of condition will improve Ability to verbalize feelings will improve Ability to verbalize feelings will improve Ability to disclose and discuss suicidal ideas Ability to demonstrate self-control will improve     Medication Management: Evaluate patient's response, side effects, and tolerance of medication regimen.  Therapeutic Interventions: 1 to 1 sessions, Unit Group sessions and Medication administration.  Evaluation of Outcomes: Adequate for discharge  RN Treatment Plan for Primary Diagnosis: Severe recurrent major depression w/psychotic features, mood-congruent (HCC) Long Term Goal(s): Knowledge of disease and therapeutic regimen to maintain health will improve  Short Term Goals: Ability to remain free from injury will improve, Ability to verbalize frustration and anger appropriately will improve, Ability to demonstrate self-control, Ability to verbalize feelings will improve, Ability to disclose and discuss suicidal ideas and Compliance with prescribed medications will improve  Medication Management: RN will administer medications as ordered by provider, will assess and evaluate patient's response and provide education to patient for prescribed medication. RN will report any adverse and/or side effects to prescribing  provider.  Therapeutic Interventions: 1 on 1 counseling sessions, Psychoeducation, Medication administration, Evaluate responses to treatment, Monitor vital signs and CBGs as ordered, Perform/monitor CIWA, COWS, AIMS and Fall Risk screenings as ordered, Perform wound care treatments as ordered.  Evaluation of Outcomes: Adequate for discharge   LCSW Treatment Plan for Primary Diagnosis: Severe recurrent major depression w/psychotic features, mood-congruent (HCC) Long Term Goal(s): Safe transition to appropriate next level of care at discharge, Engage patient in therapeutic group addressing interpersonal concerns.  Short Term Goals: Engage patient in aftercare planning with referrals and resources, Increase social support, Increase ability to appropriately verbalize feelings, Increase emotional regulation, Identify triggers associated with mental health/substance abuse issues and Increase skills for wellness and recovery  Therapeutic Interventions: Assess for all discharge needs, 1 to 1 time with Social worker, Explore available resources and support systems, Assess for adequacy in community support network, Educate family and significant other(s) on suicide prevention, Complete Psychosocial Assessment, Interpersonal group therapy.  Evaluation of Outcomes: Adequate for discharge   Progress in Treatment: Attending groups: Yes. Participating in groups: Yes. Taking medication as prescribed: Yes. Toleration medication: Yes. Family/Significant other contact made: SPE completed with pt; pt declined to consent to collateral contact.  Patient understands diagnosis: Yes. Discussing patient identified problems/goals with staff: Yes. Medical problems stabilized or resolved: No. Denies suicidal/homicidal ideation: Yes, per self report.  Issues/concerns per patient self-inventory: Yes.  New problem(s) identified: No, Describe:  CSW continuing to assess  New Short Term/Long Term Goal(s): medication  management for mood stabilization; elimination of SI thoughts; development of comprehensive mental wellness/sobriety plan.  Patient Goals:  "Controlling my thoughts."  Discharge Plan or Barriers: Pt plans to live with her mother at discharge and has [facility] appt scheduled. MHAG pamphlet, Mobile Crisis information, and AA/NA information provided to patient for additional community support and resources.   Reason for Continuation of Hospitalization: none  Estimated Length of Stay: Wed, 08/25/18  Attendees: Patient:  08/25/2018 10:00 AM  Physician: Dr.Farah 08/25/2018 10:00 AM  Nursing: Huntley DecSara RN; Alyssa RN 08/25/2018 10:00 AM  RN Care Manager: 08/25/2018 10:00 AM  Social Worker: Corrie MckusickHeather Gerasimos Plotts LCSW 08/25/2018 10:00 AM  Recreational Therapist: x 08/25/2018 10:00 AM  Other: Armandina StammerAgnes Nwoko NP 08/25/2018 10:00 AM  Other:  08/25/2018 10:00 AM  Other: 08/25/2018 10:00 AM    Scribe for Treatment Team: Rona RavensHeather S Ivar Domangue, LCSW 08/25/2018 10:00 AM
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
## 2018-08-26 ENCOUNTER — Emergency Department (HOSPITAL_COMMUNITY)
Admission: EM | Admit: 2018-08-26 | Discharge: 2018-08-26 | Disposition: A | Discharge status: Other Acute Inpatient Hospital | Payer: Self-pay

## 2018-08-26 ENCOUNTER — Emergency Department (HOSPITAL_COMMUNITY)
Admission: EM | Admit: 2018-08-26 | Discharge: 2018-08-26 | Disposition: A | Discharge status: Home / Self Care | Payer: Medicaid Other | Attending: Emergency Medicine | Admitting: Emergency Medicine

## 2018-08-26 ENCOUNTER — Emergency Department (HOSPITAL_COMMUNITY): Admission: EM | Admit: 2018-08-26 | Discharge: 2018-08-26 | Discharge status: Absent without leave | Payer: Self-pay

## 2018-08-26 DIAGNOSIS — F333 Major depressive disorder, recurrent, severe with psychotic symptoms: Secondary | ICD-10-CM | POA: Diagnosis not present

## 2018-08-26 DIAGNOSIS — R45850 Homicidal ideations: Secondary | ICD-10-CM

## 2018-08-26 DIAGNOSIS — F332 Major depressive disorder, recurrent severe without psychotic features: Secondary | ICD-10-CM

## 2018-08-26 LAB — CBC
HCT: 33.1 % — ABNORMAL LOW (ref 36.0–46.0)
Hemoglobin: 10 g/dL — ABNORMAL LOW (ref 12.0–15.0)
MCH: 24.9 pg — ABNORMAL LOW (ref 26.0–34.0)
MCHC: 30.2 g/dL (ref 30.0–36.0)
MCV: 82.5 fL (ref 80.0–100.0)
Platelets: 388 10*3/uL (ref 150–400)
RBC: 4.01 MIL/uL (ref 3.87–5.11)
RDW: 16 % — ABNORMAL HIGH (ref 11.5–15.5)
WBC: 6.3 10*3/uL (ref 4.0–10.5)
nRBC: 0 % (ref 0.0–0.2)

## 2018-08-26 LAB — I-STAT BETA HCG BLOOD, ED (MC, WL, AP ONLY): I-stat hCG, quantitative: <5 m[IU]/mL (ref <5)

## 2018-08-26 LAB — COMPREHENSIVE METABOLIC PANEL
ALT: 13 U/L (ref 0–44)
AST: 20 U/L (ref 15–41)
Albumin: 4.4 g/dL (ref 3.5–5.0)
Alkaline Phosphatase: 52 U/L (ref 38–126)
Anion gap: 11 (ref 5–15)
BUN: 13 mg/dL (ref 6–20)
CO2: 25 mmol/L (ref 22–32)
Calcium: 9.6 mg/dL (ref 8.9–10.3)
Chloride: 104 mmol/L (ref 98–111)
Creatinine, Ser: 0.73 mg/dL (ref 0.44–1.00)
GFR calc Af Amer: >60 mL/min (ref >60)
GFR calc non Af Amer: >60 mL/min (ref >60)
Glucose, Bld: 98 mg/dL (ref 70–99)
Potassium: 3.8 mmol/L (ref 3.5–5.1)
Sodium: 140 mmol/L (ref 135–145)
Total Bilirubin: 0.2 mg/dL — ABNORMAL LOW (ref 0.3–1.2)
Total Protein: 7.9 g/dL (ref 6.5–8.1)

## 2018-08-26 LAB — RAPID URINE DRUG SCREEN, HOSP PERFORMED
Amphetamines: NOT DETECTED
Barbiturates: NOT DETECTED
Benzodiazepines: NOT DETECTED
Cocaine: NOT DETECTED
Opiates: NOT DETECTED
Tetrahydrocannabinol: POSITIVE — AB

## 2018-08-26 LAB — SALICYLATE LEVEL

## 2018-08-26 LAB — ETHANOL: Alcohol, Ethyl (B): <10 mg/dL (ref <10)

## 2018-08-26 LAB — ACETAMINOPHEN LEVEL: Acetaminophen (Tylenol), Serum: <10 ug/mL — ABNORMAL LOW (ref 10–30)

## 2018-08-26 MED ORDER — PRAZOSIN HCL 1 MG PO CAPS: 2.0000 mg | ORAL_CAPSULE | Freq: Every day | ORAL | Status: DC

## 2018-08-26 MED ORDER — VORTIOXETINE HBR 5 MG PO TABS
10.0000 mg | ORAL_TABLET | Freq: Every day | ORAL | Status: DC
  Filled 2018-08-26: qty 2

## 2018-08-26 MED ORDER — PERPHENAZINE 4 MG PO TABS: 6.0000 mg | ORAL_TABLET | Freq: Every day | ORAL | Status: DC

## 2018-08-26 MED ORDER — PERPHENAZINE 2 MG PO TABS
2.0000 mg | ORAL_TABLET | Freq: Every day | ORAL | Status: DC
  Administered 2018-08-26: 2 mg via ORAL
  Filled 2018-08-26: qty 1

## 2018-08-26 MED ORDER — CYPROHEPTADINE HCL 4 MG PO TABS: 4.0000 mg | ORAL_TABLET | Freq: Every day | ORAL | Status: DC

## 2018-08-26 MED ORDER — PRENATAL MULTIVITAMIN CH: 1.0000 | ORAL_TABLET | Freq: Every day | ORAL | Status: DC

## 2018-08-26 NOTE — ED Notes (Signed)
Pt belongings logged and stored in locker number 36.  Belongings included:  Black Sweat Shirt Black sweat pants [employer] (no money in wallet, several credit cards in wallet) Medications ( RN notified of medications that are stored in locker) [employer] shoes

## 2018-08-26 NOTE — BH Assessment (Signed)
[facility] Patient reminded of treatment plan, offered support and encouragement. Attempted to offer patient bus pass.

## 2018-08-26 NOTE — ED Notes (Signed)
Pt arrived to unit cooperatively.  Pt went straight to sleep upon arrival.  15 minute checks and video monitoring in place.

## 2018-08-26 NOTE — ED Notes (Signed)
Pt discharged safely with [facility] pass [facility]  Pt was withdrawn and refusing to speak to me.  She was in no distress at discharge.  Discharge instructions were given.
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
## 2018-08-26 NOTE — ED Notes (Signed)
Bed: [facility]36 Expected date:  Expected time:  Means of arrival:  Comments: Hold for 26 
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
## 2018-08-26 NOTE — Discharge Instructions (Signed)
For your mental health needs, you are advised to follow up with [facility].  You have an appointment scheduled for today, August 26, 2018 at 10:00 am.  You are advised to keep this appointment:       [facility]      [address]      [place], [place] [address]      [phone]

## 2018-09-21 IMAGING — US US PELVIS COMPLETE
1 series · 13 of 25 positions shown · non-contrast
Comparison: None.

CLINICAL DATA: Lower abdominal pain. Negative urine pregnancy test.

EXAM:
TRANSABDOMINAL AND TRANSVAGINAL ULTRASOUND OF PELVIS
DOPPLER ULTRASOUND OF OVARIES
TECHNIQUE: Both transabdominal and transvaginal ultrasound examinations of the
pelvis were performed. Transabdominal technique was performed for
global imaging of the pelvis including uterus, ovaries, adnexal
regions, and pelvic cul-de-sac.
It was necessary to proceed with endovaginal exam following the
transabdominal exam to visualize the endometrium. Color and duplex
Doppler ultrasound was utilized to evaluate blood flow to the
ovaries.

[Series 1: us pelvis complete · 0.23mm/px · 13 of 52 slices shown]
[im 1/52]
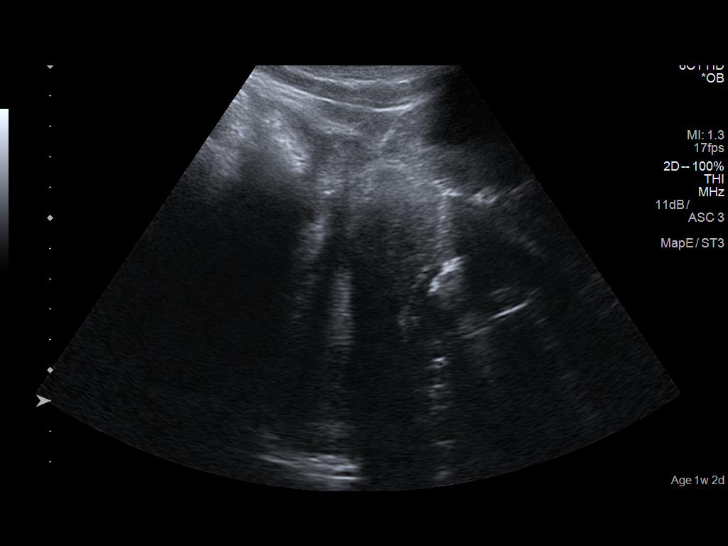
[im 5/52]
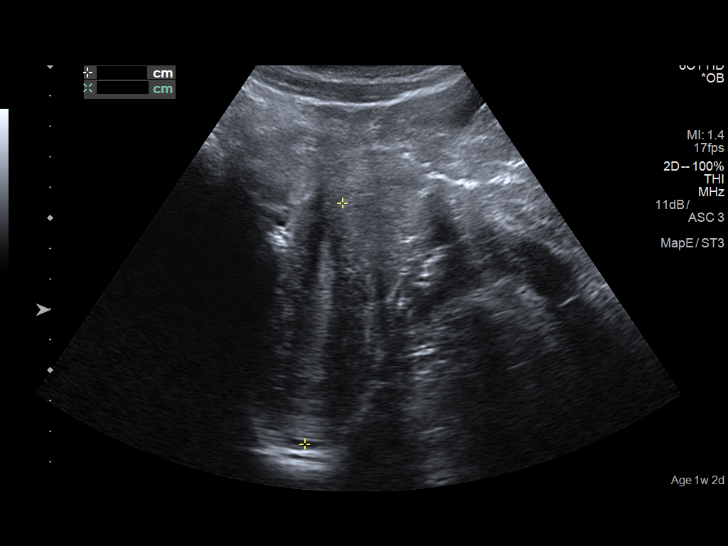
[im 9/52]
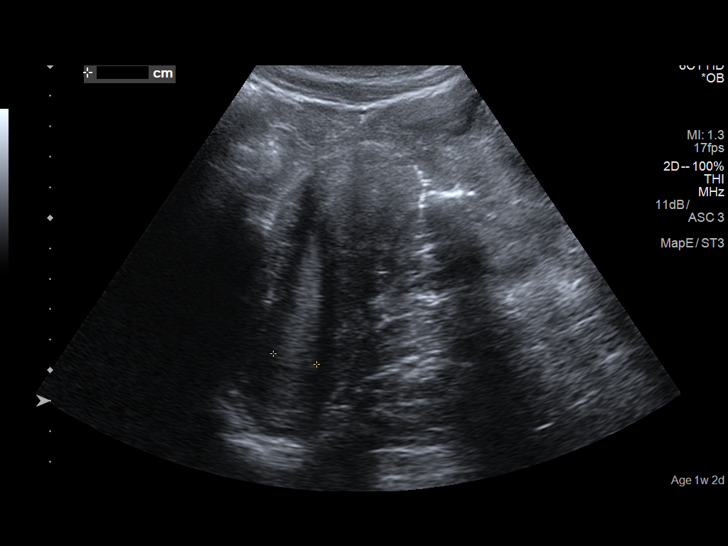
[im 13/52]
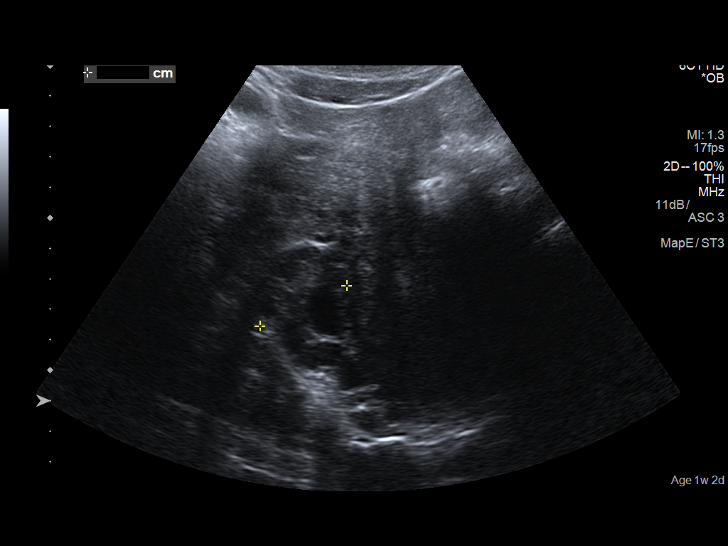
[im 18/52]
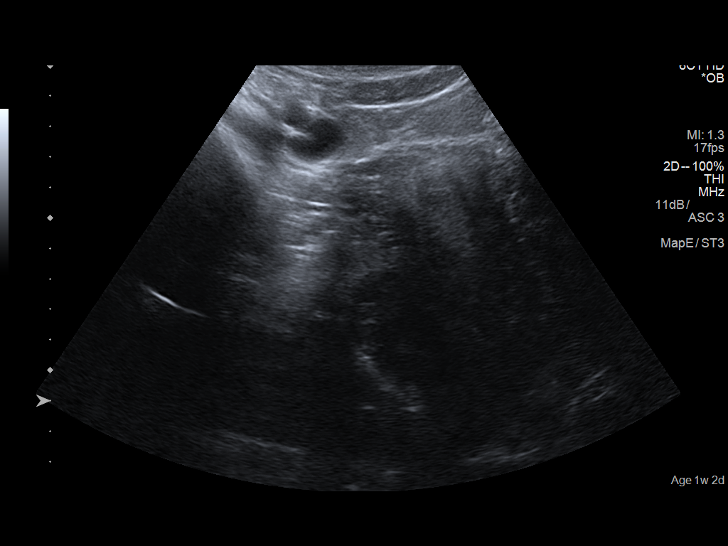
[im 22/52]
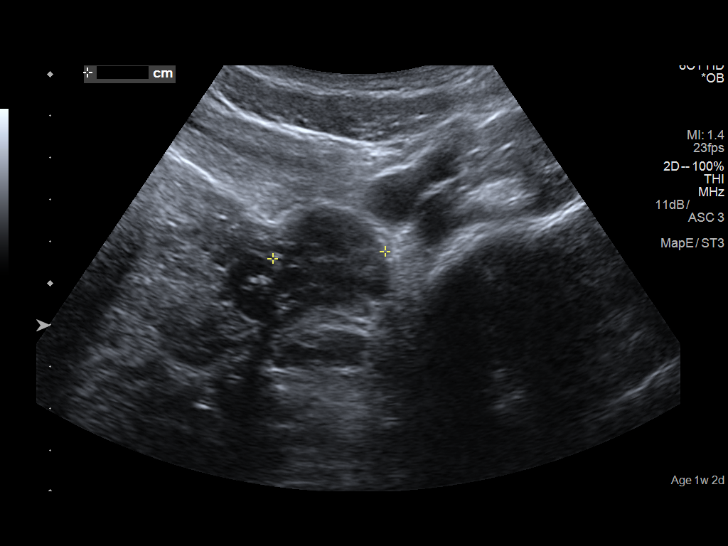
[im 26/52]
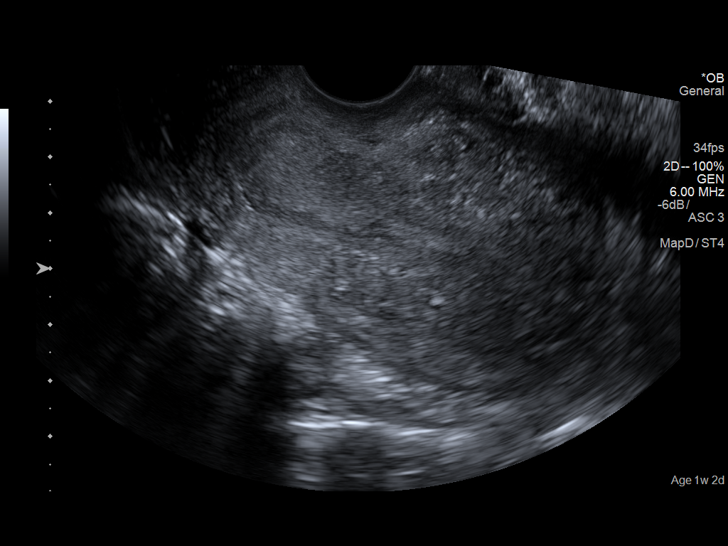
[im 30/52]
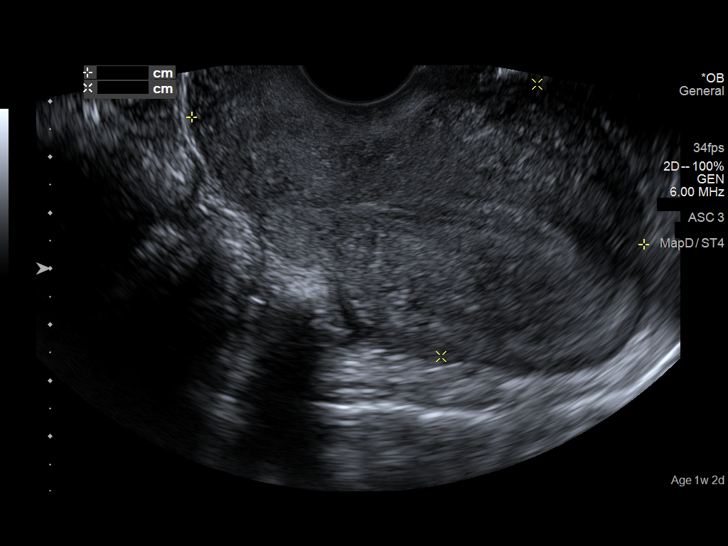
[im 35/52]
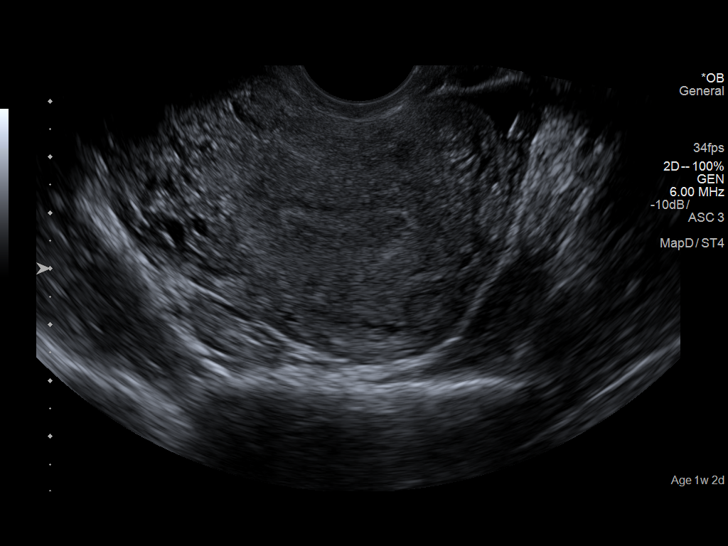
[im 39/52]
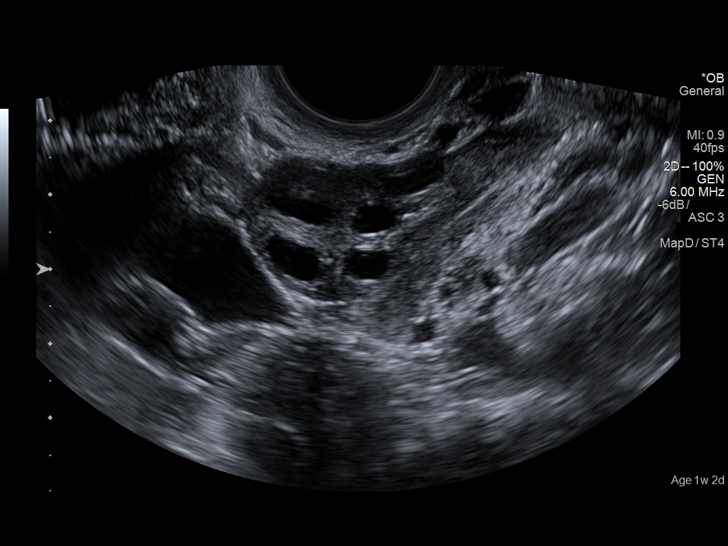
[im 43/52]
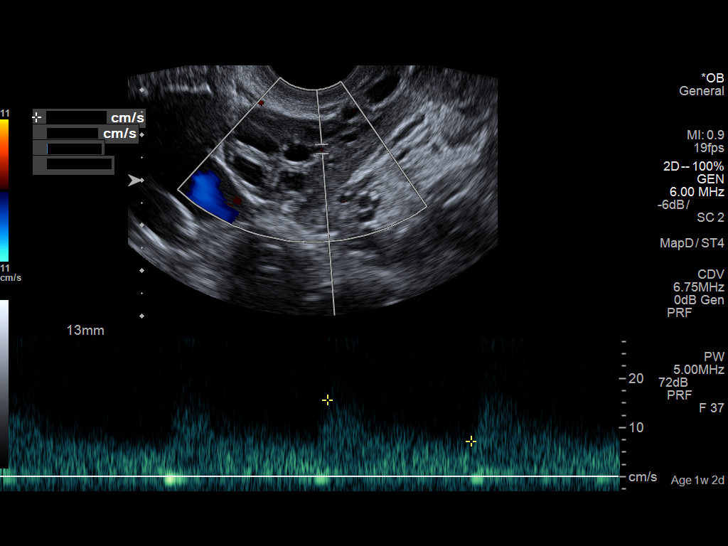
[im 47/52]
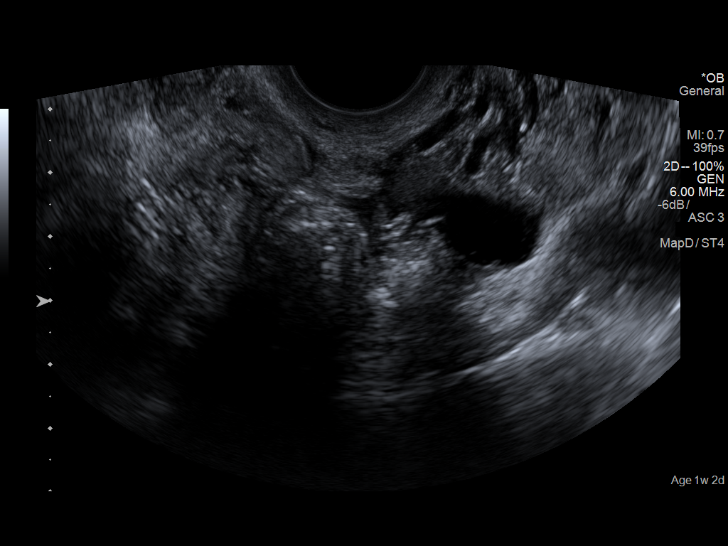
[im 52/52]
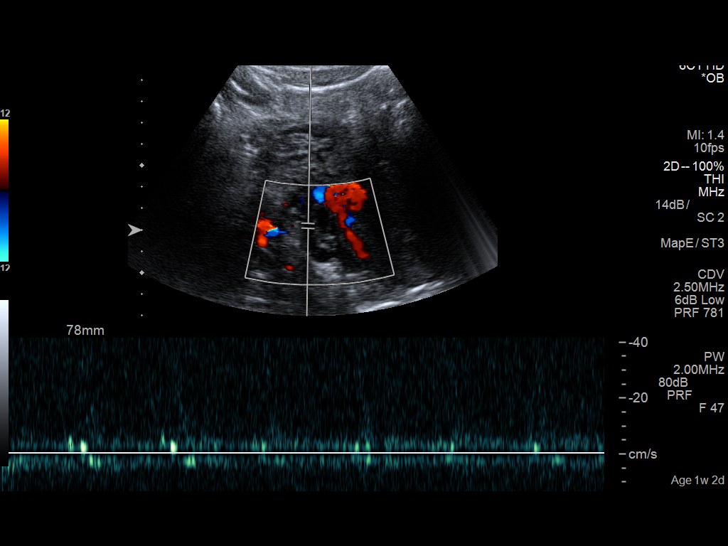

[13 of 25 positions shown; findings below may reference images not displayed]

FINDINGS: Uterus

Measurements: 8.4 x 5.9 x 5.2 cm. No fibroids or other mass
visualized.

Endometrium

Thickness: 9 mm which is within normal limits for patient of
reproductive age. No focal abnormality visualized.

Right ovary

Measurements: 4.2 x 3.1 x 2.2 cm. Normal appearance/no adnexal mass.

Left ovary

Measurements: 3.1 x 3.0 x 1.9 cm. Normal appearance/no adnexal mass.

Pulsed Doppler evaluation of both ovaries demonstrates normal
low-resistance arterial and venous waveforms.

Other findings

Trace free fluid is noted which most likely is physiologic.
IMPRESSION: No definite abnormality seen in the pelvis.

## 2021-04-16 ENCOUNTER — Other Ambulatory Visit: Payer: Self-pay

## 2021-04-16 ENCOUNTER — Emergency Department (HOSPITAL_COMMUNITY): Payer: Self-pay

## 2021-04-16 ENCOUNTER — Encounter (HOSPITAL_COMMUNITY): Payer: Self-pay | Admitting: *Deleted

## 2021-04-16 ENCOUNTER — Emergency Department (HOSPITAL_COMMUNITY)
Admission: EM | Admit: 2021-04-16 | Discharge: 2021-04-16 | Disposition: A | Discharge status: Home / Self Care | Payer: Self-pay | Attending: Emergency Medicine | Admitting: Emergency Medicine

## 2021-04-16 DIAGNOSIS — R110 Nausea: Secondary | ICD-10-CM | POA: Insufficient documentation

## 2021-04-16 DIAGNOSIS — R0789 Other chest pain: Secondary | ICD-10-CM | POA: Insufficient documentation

## 2021-04-16 DIAGNOSIS — N9489 Other specified conditions associated with female genital organs and menstrual cycle: Secondary | ICD-10-CM | POA: Insufficient documentation

## 2021-04-16 DIAGNOSIS — M545 Low back pain, unspecified: Secondary | ICD-10-CM | POA: Insufficient documentation

## 2021-04-16 DIAGNOSIS — Z7982 Long term (current) use of aspirin: Secondary | ICD-10-CM | POA: Insufficient documentation

## 2021-04-16 DIAGNOSIS — F17290 Nicotine dependence, other tobacco product, uncomplicated: Secondary | ICD-10-CM | POA: Insufficient documentation

## 2021-04-16 DIAGNOSIS — R0602 Shortness of breath: Secondary | ICD-10-CM | POA: Insufficient documentation

## 2021-04-16 LAB — BASIC METABOLIC PANEL
Anion gap: 9 (ref 5–15)
BUN: 12 mg/dL (ref 6–20)
CO2: 24 mmol/L (ref 22–32)
Calcium: 9.4 mg/dL (ref 8.9–10.3)
Chloride: 109 mmol/L (ref 98–111)
Creatinine, Ser: 0.71 mg/dL (ref 0.44–1.00)
GFR, Estimated: >60 mL/min (ref >60)
Glucose, Bld: 100 mg/dL — ABNORMAL HIGH (ref 70–99)
Potassium: 3.3 mmol/L — ABNORMAL LOW (ref 3.5–5.1)
Sodium: 142 mmol/L (ref 135–145)

## 2021-04-16 LAB — CBC
HCT: 31.1 % — ABNORMAL LOW (ref 36.0–46.0)
Hemoglobin: 9.5 g/dL — ABNORMAL LOW (ref 12.0–15.0)
MCH: 24.1 pg — ABNORMAL LOW (ref 26.0–34.0)
MCHC: 30.5 g/dL (ref 30.0–36.0)
MCV: 78.9 fL — ABNORMAL LOW (ref 80.0–100.0)
Platelets: 367 10*3/uL (ref 150–400)
RBC: 3.94 MIL/uL (ref 3.87–5.11)
RDW: 18 % — ABNORMAL HIGH (ref 11.5–15.5)
WBC: 6 10*3/uL (ref 4.0–10.5)
nRBC: 0 % (ref 0.0–0.2)

## 2021-04-16 LAB — TROPONIN I (HIGH SENSITIVITY)
Troponin I (High Sensitivity): 7 ng/L (ref <18)
Troponin I (High Sensitivity): 9 ng/L (ref <18)

## 2021-04-16 LAB — D-DIMER, QUANTITATIVE: D-Dimer, Quant: 0.47 ug/mL-FEU (ref 0.00–0.50)

## 2021-04-16 LAB — I-STAT BETA HCG BLOOD, ED (MC, WL, AP ONLY): I-stat hCG, quantitative: <5 m[IU]/mL (ref <5)

## 2021-04-16 MED ORDER — KETOROLAC TROMETHAMINE 30 MG/ML IJ SOLN
30.0000 mg | Freq: Once | INTRAMUSCULAR | Status: AC
  Administered 2021-04-16: 30 mg via INTRAVENOUS
  Filled 2021-04-16: qty 1

## 2021-04-16 MED ORDER — ACETAMINOPHEN 325 MG PO TABS
650.0000 mg | ORAL_TABLET | Freq: Once | ORAL | Status: AC
  Administered 2021-04-16: 650 mg via ORAL
  Filled 2021-04-16: qty 2

## 2021-04-16 NOTE — Discharge Instructions (Addendum)
Follow-up with the cardiology group listed below. Return to the ER if you start to experience worsening chest pain, shortness of breath, leg swelling, numbness in arms or legs

## 2021-04-16 NOTE — ED Triage Notes (Signed)
Pt complains of chest, lower back pain, nausea x 1 month.

## 2021-04-16 NOTE — ED Provider Notes (Signed)
[facility]-EMERGENCY DEPT Provider Note   CSN: [id number] Arrival date & time: 04/16/21  1024     History Chief Complaint  Patient presents with   Chest Pain   Back Pain    Nicole [name] is a 30 y.o. female presenting to the ED with a chief complaint of chest pain radiating to her back.  Reports associated nausea and shortness of breath.  Symptoms have been gradual, intermittent for the past month.  No specific trigger.  When the pain is present it is pleuritic in nature.  Denies any leg swelling.  No history of DVT or PE, MI, recent immobilization or OCP use.  Has tried aspirin with only minimal improvement in symptoms.  No abdominal pain, numbness in arms or legs.  HPI     Past Medical History:  Diagnosis Date   Major depression    Pregnancy induced hypertension 2011    Patient Active Problem List   Diagnosis Date Noted   Severe recurrent major depression w/psychotic features, mood-congruent (HCC) 08/18/2018   MDD (major depressive disorder), single episode, severe with psychotic features (HCC) 09/26/2017   Alcohol abuse 09/21/2017   Major depressive disorder, recurrent severe without psychotic features (HCC) 09/20/2017   Active labor 01/04/2014   NVD (normal vaginal delivery) 01/04/2014   Cesarean delivery, without mention of indication, delivered, with or without mention of antepartum condition 10/18/2011    Past Surgical History:  Procedure Laterality Date   CESAREAN SECTION  10/14/2011   Procedure: CESAREAN SECTION;  Surgeon: Kathreen Cosier, MD;  Location: [facility];  Service: Gynecology;  Laterality: N/A;  Primary Cesarean Section Delivery Baby Boy @ 0001, Apgars 8/9   WISDOM TOOTH EXTRACTION  2010     OB History     Gravida  5   Para  3   Term  3   Preterm  0   AB  0   Living  3      SAB  0   IAB  0   Ectopic  0   Multiple  0   Live Births  3           Family History  Problem Relation Age of Onset   Hypertension  Mother    Hypertension Father     Social History   Tobacco Use   Smoking status: Some Days    Types: Cigars   Smokeless tobacco: Never  Vaping Use   Vaping Use: Never used  Substance Use Topics   Alcohol use: Yes    Comment: Social   Drug use: No    Comment: Hx of Marijuana     Home Medications Prior to Admission medications   Medication Sig Start Date End Date Taking? Authorizing Provider  aspirin EC 81 MG tablet Take 81 mg by mouth every 4 (four) hours as needed for mild pain.   Yes [provider]  Multiple Vitamin (MULTIVITAMIN) tablet Take 1 tablet by mouth daily.   Yes [provider]  Naphazoline HCl (CLEAR EYES OP) Place 1 drop into both eyes daily as needed (dry/itchy eyes).   Yes [provider]  cyproheptadine (PERIACTIN) 4 MG tablet Take 1 tablet (4 mg total) by mouth at bedtime. Patient not taking: Reported on 04/16/2021 08/25/18   Malvin Johns, MD  perphenazine (TRILAFON) 2 MG tablet 1 in am 3 at h s Patient not taking: Reported on 04/16/2021 08/25/18   Malvin Johns, MD  prazosin (MINIPRESS) 2 MG capsule Take 1 capsule (2 mg  total) by mouth at bedtime. Patient not taking: Reported on 04/16/2021 08/25/18   Malvin Johns, MD  Prenatal Vit-Fe Fumarate-FA (PRENATAL MULTIVITAMIN) TABS tablet Take 1 tablet by mouth daily at 12 noon. Patient not taking: Reported on 04/16/2021 08/25/18   Malvin Johns, MD  vortioxetine HBr (TRINTELLIX) 10 MG TABS tablet Take 1 tablet (10 mg total) by mouth daily. Patient not taking: Reported on 04/16/2021 08/26/18   Malvin Johns, MD    Allergies    Apricot flavor  Review of Systems   Review of Systems  Constitutional:  Negative for appetite change, chills and fever.  HENT:  Negative for ear pain, rhinorrhea, sneezing and sore throat.   Eyes:  Negative for photophobia and visual disturbance.  Respiratory:  Negative for cough, chest tightness, shortness of breath and wheezing.   Cardiovascular:  Positive for chest  pain. Negative for palpitations.  Gastrointestinal:  Negative for abdominal pain, blood in stool, constipation, diarrhea, nausea and vomiting.  Genitourinary:  Negative for dysuria, hematuria and urgency.  Musculoskeletal:  Positive for back pain. Negative for myalgias.  Skin:  Negative for rash.  Neurological:  Negative for dizziness, weakness and light-headedness.   Physical Exam Updated Vital Signs BP 131/75   Pulse 90   Temp 98.6 F (37 C) (Oral)   Resp 17   Ht 5\' 5"  (1.651 m)   Wt 86.2 kg   LMP 02/06/2021   SpO2 98%   BMI 31.62 kg/m   Physical Exam Vitals and nursing note reviewed.  Constitutional:      General: She is not in acute distress.    Appearance: She is well-developed.  HENT:     Head: Normocephalic and atraumatic.     Nose: Nose normal.  Eyes:     General: No scleral icterus.       Left eye: No discharge.     Conjunctiva/sclera: Conjunctivae normal.  Cardiovascular:     Rate and Rhythm: Normal rate and regular rhythm.     Heart sounds: Normal heart sounds. No murmur heard.   No friction rub. No gallop.  Pulmonary:     Effort: Pulmonary effort is normal. No respiratory distress.     Breath sounds: Normal breath sounds.  Abdominal:     General: Bowel sounds are normal. There is no distension.     Palpations: Abdomen is soft.     Tenderness: There is no abdominal tenderness. There is no guarding.  Musculoskeletal:        General: Normal range of motion.     Cervical back: Normal range of motion and neck supple.     Right lower leg: No tenderness. No edema.     Left lower leg: No tenderness. No edema.  Skin:    General: Skin is warm and dry.     Findings: No rash.  Neurological:     Mental Status: She is alert.     Motor: No abnormal muscle tone.     Coordination: Coordination normal.    ED Results / Procedures / Treatments   Labs (all labs ordered are listed, but only abnormal results are displayed) Labs Reviewed  BASIC METABOLIC PANEL -  Abnormal; Notable for the following components:      Result Value   Potassium 3.3 (*)    Glucose, Bld 100 (*)    All other components within normal limits  CBC - Abnormal; Notable for the following components:   Hemoglobin 9.5 (*)    HCT 31.1 (*)    MCV 78.9 (*)  MCH 24.1 (*)    RDW 18.0 (*)    All other components within normal limits  D-DIMER, QUANTITATIVE  I-STAT BETA HCG BLOOD, ED ([facility], [facility], [facility] ONLY)  TROPONIN I (HIGH SENSITIVITY)  TROPONIN I (HIGH SENSITIVITY)    EKG None  Radiology DG Chest 2 View  Result Date: 04/16/2021 CLINICAL DATA:  30 year old female with history of chest pain. EXAM: CHEST - 2 VIEW COMPARISON:  No priors. FINDINGS: Lung volumes are normal. No consolidative airspace disease. No pleural effusions. No pneumothorax. No pulmonary nodule or mass noted. Pulmonary vasculature and the cardiomediastinal silhouette are within normal limits. IMPRESSION: No radiographic evidence of acute cardiopulmonary disease. Electronically Signed   By: Trudie Reed M.D.   On: 04/16/2021 12:21    Procedures Procedures   Medications Ordered in ED Medications  acetaminophen (TYLENOL) tablet 650 mg (has no administration in time range)  ketorolac (TORADOL) 30 MG/ML injection 30 mg (has no administration in time range)    ED Course  I have reviewed the triage vital signs and the nursing notes.  Pertinent labs & imaging results that were available during my care of the patient were reviewed by me and considered in my medical decision making (see chart for details).  Clinical Course as of 04/16/21 1412  Tue Apr 16, 2021  1208 D-Dimer, Quant: 0.47 [HK]  1208 I-stat hCG, quantitative: <5.0 [HK]    Clinical Course User Index [HK] Dietrich Pates, PA-C   MDM Rules/Calculators/A&P                           29yo F presenting to the ED with a chief complaint of chest pain radiating to her back.  Reports associated nausea and shortness of breath.  Symptoms have been gradual  for the past month.  No specific trigger.  No leg swelling, history of DVT or PE, MI, recent immobilization.  On exam lungs are clear to auscultation bilaterally.  Vital signs within normal limits.  No lower extremity edema, erythema or calf tenderness bilaterally that would concern me for DVT.  EKG shows sinus rhythm, nonspecific T wave changes.  No priors for comparison.  Chest x-ray is unremarkable.  CBC, BMP unremarkable.  Troponin negative x2.  D-dimer obtained due to her pleuritic pain and was negative.  hCG is negative.  On recheck patient states that she is having some pain and will give Toradol and Tylenol with improvement.  Suspect that her symptoms could be musculoskeletal in nature.  Due to this abnormal but nonischemic EKG we will have her follow-up with cardiology for further work-up.  At this time low suspicion for ACS, PE or any other structural cause on x-ray as part of her pain.  We will have her continue muscle relaxer as needed and return for worsening symptoms.   Patient is hemodynamically stable, in NAD, and able to ambulate in the ED. Evaluation does not show pathology that would require ongoing emergent intervention or inpatient treatment. I explained the diagnosis to the patient. Pain has been managed and has no complaints prior to discharge. Patient is comfortable with above plan and is stable for discharge at this time. All questions were answered prior to disposition. Strict return precautions for returning to the ED were discussed. Encouraged follow up with PCP.   An After Visit Summary was printed and given to the patient.   Portions of this note were generated with Scientist, clinical (histocompatibility and immunogenetics). Dictation errors may occur despite best attempts  at proofreading.  Final Clinical Impression(s) / ED Diagnoses Final diagnoses:  Chest wall pain    Rx / DC Orders ED Discharge Orders     None        Dietrich PatesKhatri, Sybil Shrader, PA-C 04/16/21 1412    Bethann BerkshireZammit, Joseph, MD 04/21/21 1009

## 2023-06-28 IMAGING — CR DG CHEST 2V
2 series · 2 of 2 positions shown · non-contrast
Comparison: No priors.

CLINICAL DATA: 29-year-old female with history of chest pain.

EXAM:
CHEST - 2 VIEW

[w chest pa]
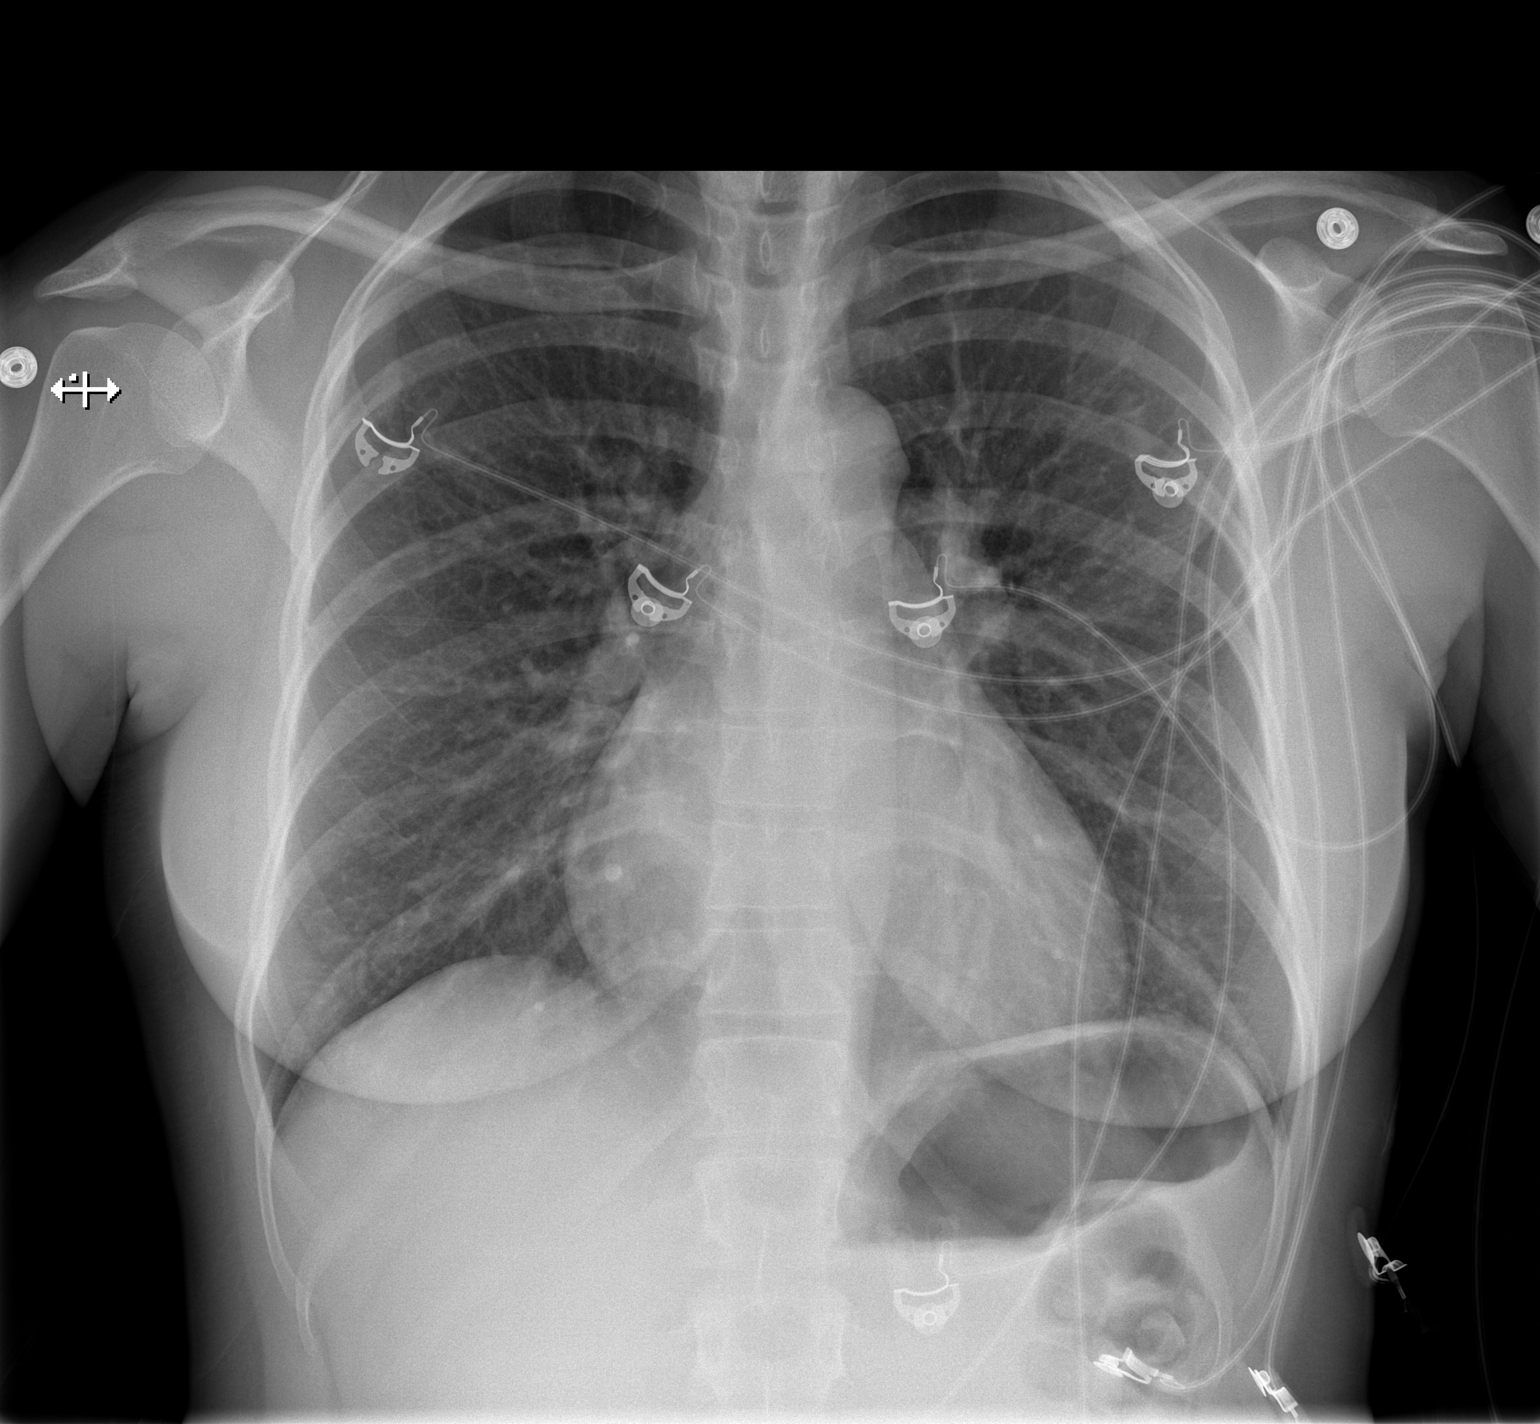

[w chest lat]
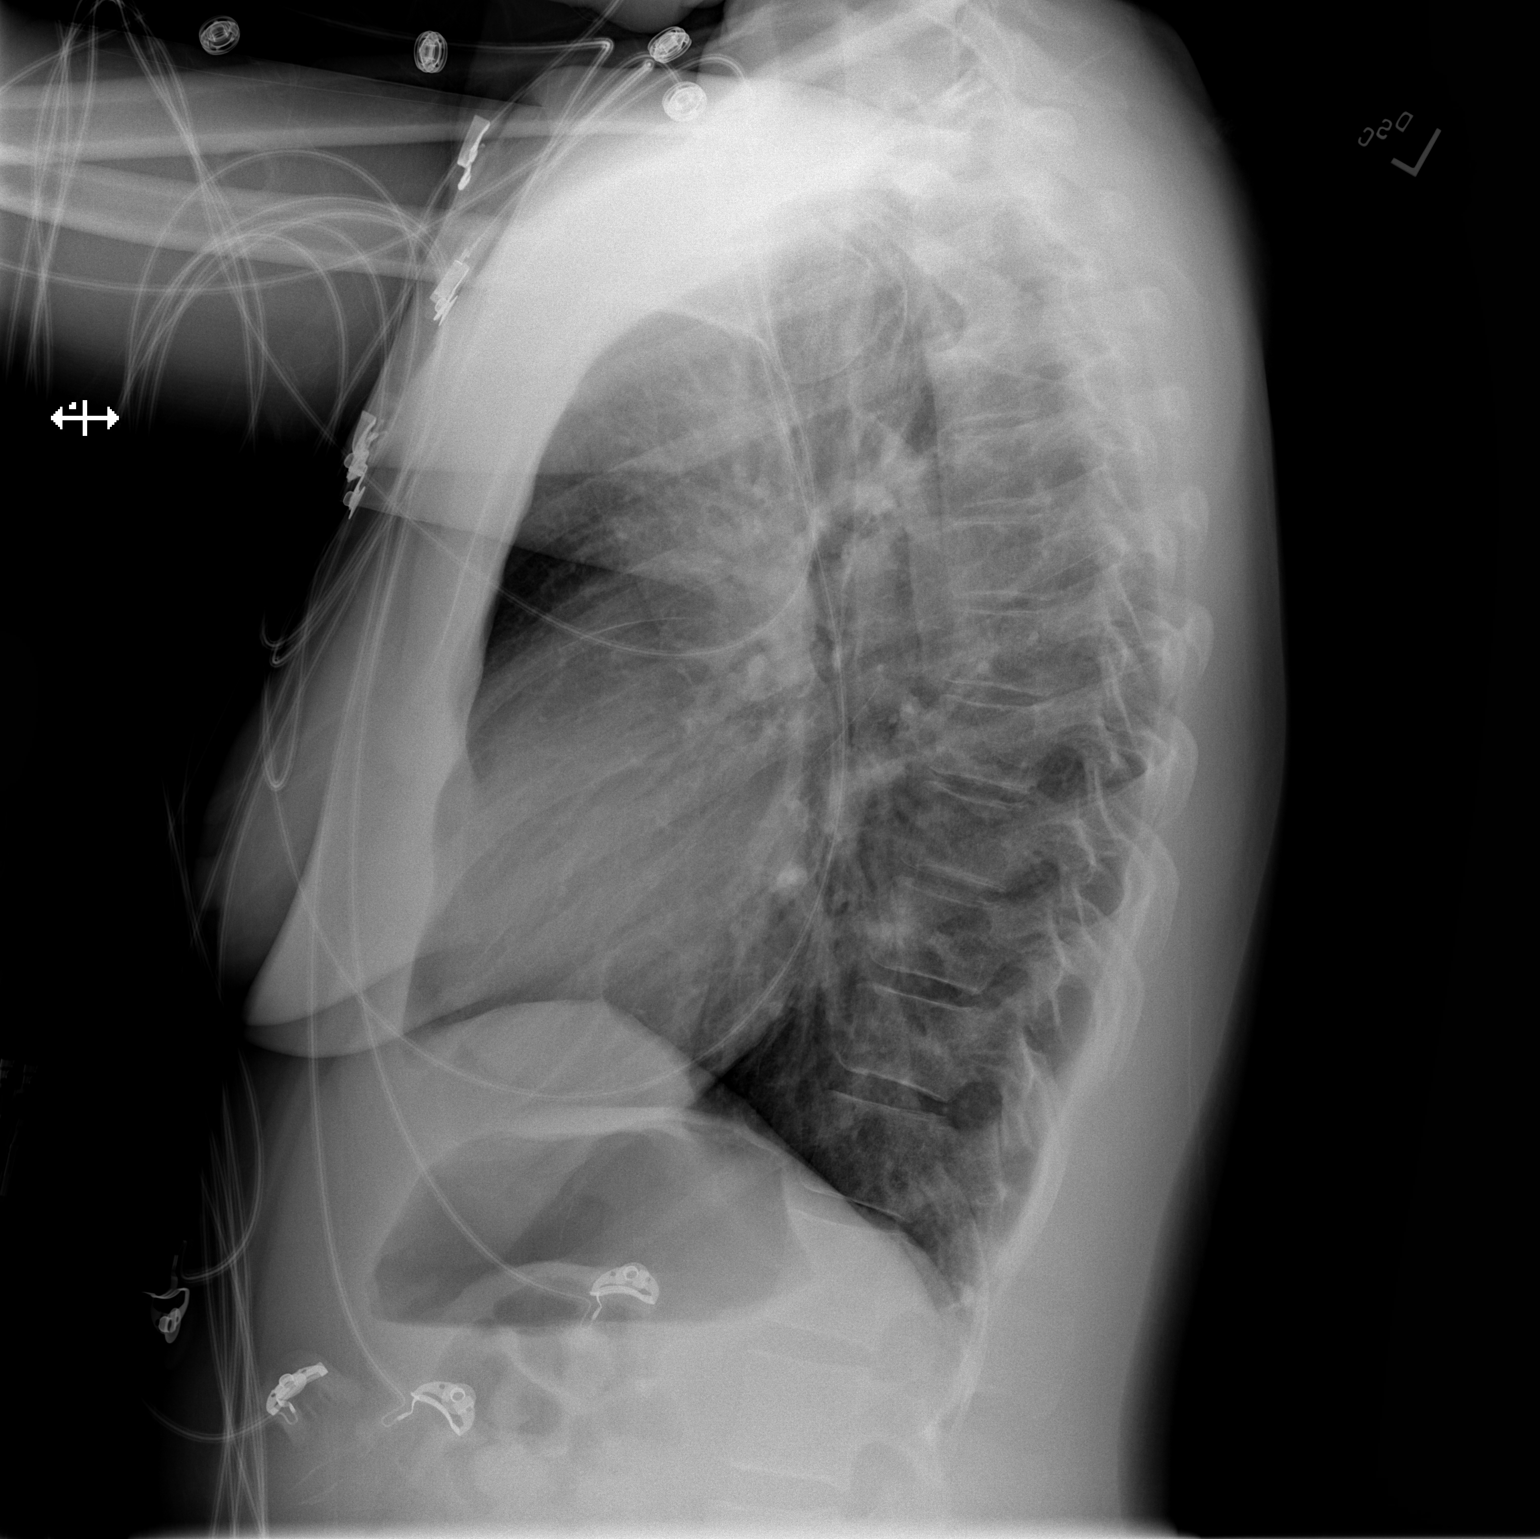

[2 of 2 positions shown; findings below may reference images not displayed]

FINDINGS: Lung volumes are normal. No consolidative airspace disease. No
pleural effusions. No pneumothorax. No pulmonary nodule or mass
noted. Pulmonary vasculature and the cardiomediastinal silhouette
are within normal limits.
IMPRESSION: No radiographic evidence of acute cardiopulmonary disease.
